# Patient Record
Sex: Female | Born: 1950 | Race: Black or African American | Hispanic: No | Marital: Married | Smoking: Never smoker
Health system: Southern US, Community
[De-identification: ages and names within clinical notes are randomized; demographics above are authoritative.]

## PROBLEM LIST (undated history)

## (undated) DIAGNOSIS — I10 Essential (primary) hypertension: Secondary | ICD-10-CM

## (undated) DIAGNOSIS — E7800 Pure hypercholesterolemia, unspecified: Secondary | ICD-10-CM

## (undated) DIAGNOSIS — E119 Type 2 diabetes mellitus without complications: Secondary | ICD-10-CM

## (undated) HISTORY — PX: BREAST EXCISIONAL BIOPSY: SUR124

## (undated) HISTORY — DX: Pure hypercholesterolemia, unspecified: E78.00

## (undated) HISTORY — DX: Essential (primary) hypertension: I10

---

## 1993-07-23 HISTORY — PX: TOTAL ABDOMINAL HYSTERECTOMY: SHX209

## 1998-07-23 HISTORY — PX: BREAST EXCISIONAL BIOPSY: SUR124

## 2015-05-31 ENCOUNTER — Other Ambulatory Visit: Payer: Self-pay | Admitting: Internal Medicine

## 2015-05-31 ENCOUNTER — Ambulatory Visit
Admission: RE | Admit: 2015-05-31 | Discharge: 2015-05-31 | Disposition: A | Discharge status: Home / Self Care | Payer: BLUE CROSS/BLUE SHIELD | Source: Ambulatory Visit | Attending: Internal Medicine | Admitting: Internal Medicine

## 2015-05-31 DIAGNOSIS — R52 Pain, unspecified: Secondary | ICD-10-CM

## 2016-08-16 ENCOUNTER — Encounter: Payer: BLUE CROSS/BLUE SHIELD | Admitting: Neurology

## 2016-08-23 ENCOUNTER — Encounter (INDEPENDENT_AMBULATORY_CARE_PROVIDER_SITE_OTHER): Payer: Self-pay

## 2016-08-23 ENCOUNTER — Ambulatory Visit (INDEPENDENT_AMBULATORY_CARE_PROVIDER_SITE_OTHER): Payer: PRIVATE HEALTH INSURANCE | Admitting: Diagnostic Neuroimaging

## 2016-08-23 DIAGNOSIS — Z0289 Encounter for other administrative examinations: Secondary | ICD-10-CM

## 2016-08-23 DIAGNOSIS — R200 Anesthesia of skin: Secondary | ICD-10-CM

## 2016-08-23 DIAGNOSIS — R208 Other disturbances of skin sensation: Secondary | ICD-10-CM

## 2016-08-27 NOTE — Procedures (Signed)
[facility]  NCS (NERVE CONDUCTION STUDY) WITH EMG (ELECTROMYOGRAPHY) REPORT   STUDY DATE: 08/23/16 PATIENT NAME: Michele Pope DOB: 06-29-1951 MRN: [id number]  ORDERING CLINICIAN: Enrigue CatenaJ Moore, NP  TECHNOLOGIST: Gearldine ShownLorraine jones  ELECTROMYOGRAPHER: Glenford BayleyVikram R. Taniah Reinecke, MD  CLINICAL INFORMATION: 66 year old female with left foot numbness, now improved. Patient denies back pain. Patient has mild diabetes.  FINDINGS: NERVE CONDUCTION STUDY: Bilateral peroneal and tibial motor responses and F wave latencies are normal.  Bilateral superficial peroneal sensory responses could not be obtained.   NEEDLE ELECTROMYOGRAPHY: Needle examination of left vastus medialis, tibials anterior and gastrocnemius muscles is normal.   IMPRESSION:  Mildly abnormal study demonstrating distal, symmetric axonal sensory neuropathy.      INTERPRETING PHYSICIAN:  Suanne MarkerVIKRAM R. Shareeka Yim, MD Certified in Neurology, Neurophysiology and Neuroimaging  [facility] [address], [address] [place], [place] [address] [phone]      [facility]    Nerve / Sites Rec. Site Peak Lat Ref.  Amp Ref. Segments Distance    ms ms V V  cm  R Superficial peroneal - Ankle     Lat leg Ankle NR ?4.4 NR ?6 Lat leg - Ankle 14  L Superficial peroneal - Ankle     Lat leg Ankle NR ?4.4 NR ?6 Lat leg - Ankle 14         [facility]    Nerve / Sites Muscle Latency Ref. Amplitude Ref. Rel Amp Segments Distance Lat Diff Velocity Ref. Area    ms ms mV mV %  cm ms m/s m/s mVms  L Peroneal - EDB     Ankle EDB 5.8 ?6.5 3.9 ?2.0 100 Ankle - EDB 9    14.4     Fib head EDB 12.9  6.1  155 Fib head - Ankle 37 7.0 53 ?44 23.8     Pop fossa EDB 14.4  5.7  93.7 Pop fossa - Fib head 10 1.5 66 ?44 22.0         Pop fossa - Ankle  8.5     L Tibial - AH     Ankle AH 4.4 ?5.8 10.0 ?4.0 100 Ankle - AH 9    32.6     Pop fossa AH 13.2  7.5  75.4 Pop fossa - Ankle 42 8.8 48 ?41 28.3  R Tibial - AH     Ankle AH 6.0  ?5.8 8.8 ?4.0 100 Ankle - AH 9    19.9     Pop fossa AH 14.3  7.0  78.9 Pop fossa - Ankle 44 8.3 53 ?41 21.6  R Peroneal - EDB     Ankle EDB 3.5 ?6.5 15.5 ?2.0 100 Ankle - EDB 9    51.5     Fib head EDB 10.6  12.7  82 Fib head - Ankle 37 7.1 52 ?44 47.3     Pop fossa EDB 12.6  11.3  88.7 Pop fossa - Fib head 10 1.9 52 ?44 35.8         Pop fossa - Ankle  9.0                F  Wave    Nerve F Lat Ref.   ms ms  L Peroneal - EDB 51.7 ?56.0  L Tibial - AH 26.8 ?56.0  R Tibial - AH 50.1 ?56.0  R Peroneal - EDB 49.0 ?56.0             EMG  EMG Summary Table    Spontaneous MUAP Recruitment  Muscle IA Fib PSW Fasc Other Amp Dur. Poly Pattern  L. Vastus medialis Normal None None None _______ Normal Normal Normal Normal  L. Tibialis anterior Normal None None None _______ Normal Normal Normal Normal  L. Gastrocnemius (Medial head) Normal None None None _______ Normal Normal Normal Normal

## 2017-04-23 ENCOUNTER — Other Ambulatory Visit: Payer: Self-pay | Admitting: Internal Medicine

## 2017-04-23 ENCOUNTER — Other Ambulatory Visit: Payer: Self-pay | Admitting: Nurse Practitioner

## 2017-04-23 DIAGNOSIS — N63 Unspecified lump in unspecified breast: Secondary | ICD-10-CM

## 2017-04-29 ENCOUNTER — Ambulatory Visit
Admission: RE | Admit: 2017-04-29 | Discharge: 2017-04-29 | Disposition: A | Discharge status: Home / Self Care | Payer: PRIVATE HEALTH INSURANCE | Source: Ambulatory Visit | Attending: Internal Medicine | Admitting: Internal Medicine

## 2017-04-29 DIAGNOSIS — N63 Unspecified lump in unspecified breast: Secondary | ICD-10-CM

## 2018-01-07 DIAGNOSIS — Z0000 Encounter for general adult medical examination without abnormal findings: Secondary | ICD-10-CM | POA: Insufficient documentation

## 2018-01-07 LAB — CBC AND DIFFERENTIAL
HEMATOCRIT: 41 (ref 36–46)
HEMOGLOBIN: 14 (ref 12.0–16.0)
PLATELETS: 307 (ref 150–399)
WBC: 6.9

## 2018-01-07 LAB — BASIC METABOLIC PANEL
BUN: 13 (ref 4–21)
Creatinine: 0.9 (ref 0.5–1.1)
GLUCOSE: 94
Potassium: 4.6 (ref 3.4–5.3)
SODIUM: 138 (ref 137–147)

## 2018-01-07 LAB — VITAMIN D 25 HYDROXY (VIT D DEFICIENCY, FRACTURES): Vit D, 25-Hydroxy: 43.4

## 2018-01-07 LAB — HEPATIC FUNCTION PANEL
ALT: 44 — AB (ref 7–35)
AST: 49 — AB (ref 13–35)
Alkaline Phosphatase: 70 (ref 25–125)
Bilirubin, Total: 0.4

## 2018-01-07 LAB — LIPID PANEL
CHOLESTEROL: 147 (ref 0–200)
HDL: 36 (ref 35–70)
LDL Cholesterol: 65
LDL/HDL RATIO: 1.8
TRIGLYCERIDES: 229 — AB (ref 40–160)

## 2018-01-07 LAB — HEMOGLOBIN A1C: HEMOGLOBIN A1C: 6.1

## 2018-01-07 LAB — VITAMIN B12: VITAMIN B 12: 273

## 2018-02-04 ENCOUNTER — Other Ambulatory Visit (HOSPITAL_BASED_OUTPATIENT_CLINIC_OR_DEPARTMENT_OTHER): Payer: Self-pay

## 2018-02-04 DIAGNOSIS — R0683 Snoring: Secondary | ICD-10-CM

## 2018-02-04 DIAGNOSIS — R5383 Other fatigue: Secondary | ICD-10-CM

## 2018-05-10 ENCOUNTER — Encounter: Payer: Self-pay | Admitting: Internal Medicine

## 2018-05-10 DIAGNOSIS — Z0000 Encounter for general adult medical examination without abnormal findings: Secondary | ICD-10-CM

## 2018-05-14 ENCOUNTER — Ambulatory Visit: Payer: Medicare Other | Admitting: Internal Medicine

## 2018-05-14 ENCOUNTER — Encounter: Payer: Self-pay | Admitting: Internal Medicine

## 2018-05-14 ENCOUNTER — Other Ambulatory Visit: Payer: Self-pay

## 2018-05-14 VITALS — BP 126/78 | HR 77 | Temp 97.9°F | Ht 66.5 in | Wt 206.6 lb

## 2018-05-14 DIAGNOSIS — Z23 Encounter for immunization: Secondary | ICD-10-CM | POA: Diagnosis not present

## 2018-05-14 DIAGNOSIS — R748 Abnormal levels of other serum enzymes: Secondary | ICD-10-CM | POA: Diagnosis not present

## 2018-05-14 DIAGNOSIS — E1159 Type 2 diabetes mellitus with other circulatory complications: Secondary | ICD-10-CM | POA: Insufficient documentation

## 2018-05-14 DIAGNOSIS — D519 Vitamin B12 deficiency anemia, unspecified: Secondary | ICD-10-CM | POA: Diagnosis not present

## 2018-05-14 DIAGNOSIS — I10 Essential (primary) hypertension: Secondary | ICD-10-CM

## 2018-05-14 DIAGNOSIS — E1165 Type 2 diabetes mellitus with hyperglycemia: Secondary | ICD-10-CM

## 2018-05-14 DIAGNOSIS — E119 Type 2 diabetes mellitus without complications: Secondary | ICD-10-CM | POA: Insufficient documentation

## 2018-05-14 DIAGNOSIS — E1169 Type 2 diabetes mellitus with other specified complication: Secondary | ICD-10-CM | POA: Insufficient documentation

## 2018-05-14 LAB — CMP14+EGFR
A/G RATIO: 1.5 (ref 1.2–2.2)
ALBUMIN: 4.4 g/dL (ref 3.6–4.8)
ALK PHOS: 71 IU/L (ref 39–117)
ALT: 47 IU/L — ABNORMAL HIGH (ref 0–32)
AST: 54 IU/L — AB (ref 0–40)
BUN / CREAT RATIO: 13 (ref 12–28)
BUN: 12 mg/dL (ref 8–27)
Bilirubin Total: 0.4 mg/dL (ref 0.0–1.2)
CALCIUM: 10.1 mg/dL (ref 8.7–10.3)
CO2: 23 mmol/L (ref 20–29)
CREATININE: 0.92 mg/dL (ref 0.57–1.00)
Chloride: 101 mmol/L (ref 96–106)
GFR calc Af Amer: 76 mL/min/{1.73_m2} (ref >59)
GFR, EST NON AFRICAN AMERICAN: 66 mL/min/{1.73_m2} (ref >59)
Globulin, Total: 3 g/dL (ref 1.5–4.5)
Glucose: 89 mg/dL (ref 65–99)
POTASSIUM: 4.6 mmol/L (ref 3.5–5.2)
SODIUM: 140 mmol/L (ref 134–144)
Total Protein: 7.4 g/dL (ref 6.0–8.5)

## 2018-05-14 LAB — HEMOGLOBIN A1C
ESTIMATED AVERAGE GLUCOSE: 126 mg/dL
Hgb A1c MFr Bld: 6 % — ABNORMAL HIGH (ref 4.8–5.6)

## 2018-05-14 MED ORDER — GLUCOSE BLOOD VI STRP: ORAL_STRIP | 11 refills | Status: DC

## 2018-05-14 MED ORDER — CYANOCOBALAMIN 1000 MCG/ML IJ SOLN: 1000.0000 ug | Freq: Once | INTRAMUSCULAR | Status: DC

## 2018-05-14 MED ORDER — ZOSTER VAC RECOMB ADJUVANTED 50 MCG/0.5ML IM SUSR: 0.5000 mL | Freq: Once | INTRAMUSCULAR | 1 refills | Status: AC

## 2018-05-14 MED ORDER — ONETOUCH ULTRA 2 W/DEVICE KIT: PACK | 1 refills | Status: DC

## 2018-05-14 NOTE — Progress Notes (Signed)
Subjective:     Patient ID: Michele Pope , female    DOB: 08-31-1950 , 67 y.o.   MRN: [id number]   Diabetes  She presents for her follow-up diabetic visit. She has type 2 diabetes mellitus. There are no hypoglycemic associated symptoms. There are no diabetic associated symptoms. There are no hypoglycemic complications. Her breakfast blood glucose is taken between 8-9 am. Her breakfast blood glucose range is generally 110-130 mg/dl.  Hypertension  This is a chronic problem. The problem is unchanged. The problem is controlled. Risk factors for coronary artery disease include diabetes mellitus, dyslipidemia, post-menopausal state and sedentary lifestyle.     History reviewed. No pertinent past medical history.    Current Outpatient Medications:  .  Blood Glucose Monitoring Suppl (ONE TOUCH ULTRA 2) w/Device KIT, Use as directed to check blood sugars 1 time per day dx: e11.22, Disp: 1 each, Rfl: 1 .  diclofenac sodium (VOLTAREN) 1 % GEL, Apply topically 4 (four) times daily., Disp: , Rfl:  .  ezetimibe (ZETIA) 10 MG tablet, Take 10 mg by mouth daily., Disp: , Rfl:  .  glucose blood (ONE TOUCH ULTRA TEST) test strip, Use as instructed to check blood sugars 1 time per day dx: e11.65, Disp: 100 each, Rfl: 11 .  Icosapent Ethyl (VASCEPA PO), Take by mouth., Disp: , Rfl:  .  levocetirizine (XYZAL) 5 MG tablet, Take 5 mg by mouth every evening., Disp: , Rfl:  .  lisinopril (PRINIVIL,ZESTRIL) 10 MG tablet, Take 10 mg by mouth daily., Disp: , Rfl:  .  loratadine (CLARITIN) 10 MG tablet, Take 10 mg by mouth daily., Disp: , Rfl:  .  meloxicam (MOBIC) 15 MG tablet, Take 15 mg by mouth daily., Disp: , Rfl:  .  mometasone (NASONEX) 50 MCG/ACT nasal spray, Place 2 sprays into the nose daily., Disp: , Rfl:  .  omeprazole (PRILOSEC) 40 MG capsule, Take 40 mg by mouth daily., Disp: , Rfl:  .  SitaGLIPtin-MetFORMIN HCl (JANUMET XR) 50-1000 MG TB24, Take 1 tablet by mouth 2 (two) times daily., Disp: , Rfl:  .   Vitamin D, Cholecalciferol, 1000 units CAPS, Take by mouth., Disp: , Rfl:   Current Facility-Administered Medications:  .  cyanocobalamin ((VITAMIN B-12)) injection 1,000 mcg, 1,000 mcg, Intramuscular, Once, Glendale Chard, MD   Allergies  Allergen Reactions  . Pitavastatin      Review of Systems  Constitutional: Negative.   HENT: Negative.   Respiratory: Negative.   Cardiovascular: Negative.   Psychiatric/Behavioral: Negative.      Today's Vitals   05/14/18 0932  BP: 126/78  Pulse: 77  Temp: 97.9 F (36.6 C)  TempSrc: Oral  Weight: 206 lb 9.6 oz (93.7 kg)  Height: 5' 6.5" (1.689 m)  PainSc: 0-No pain   Body mass index is 32.85 kg/m.   Objective:  Physical Exam  Constitutional: She is oriented to person, place, and time. She appears well-developed and well-nourished.  Eyes: EOM are normal.  Neck: Normal range of motion.  Cardiovascular: Normal rate, regular rhythm and normal heart sounds.  Pulmonary/Chest: Effort normal and breath sounds normal.  Neurological: She is alert and oriented to person, place, and time.  Psychiatric: She has a normal mood and affect.  Nursing note and vitals reviewed.       Assessment And Plan:     1. Uncontrolled type 2 diabetes mellitus with hyperglycemia (HCC)  I WILL CHECK LABS AS LISTED BELOW. SHE IS ENCOURAGED TO EXERCISE NO LESS THAN FIVE DAYS WEEKLY FOR  AT LEAST 30 MINUTES.   - CMP14+EGFR - Hemoglobin A1c  2. Essential hypertension, benign  WELL CONTROLLED.  CONTINUE WITH CURRENT MEDS. SHE IS ENCOURAGED TO AVOID ADDING SALT TO HER FOODS.   3. Anemia due to vitamin B12 deficiency, unspecified B12 deficiency type  SHE WAS GIVEN VIT B12 IM X 1. SHE WILL RTO IN 3 MONTHS FOR RE-EVALUATION.   - cyanocobalamin ((VITAMIN B-12)) injection 1,000 mcg  4. Abnormal liver enzymes  I WILL CHECK REPEAT LFTS TODAY. I THINK HER LEVELS ARE DUE TO FATTY LIVER. SHE IS ENCOURAGED TO EXERCISE REGULARLY AND AVOID PROCESSED FOODS.   -  CMP14+EGFR  5. Need for vaccination  - Flu vaccine HIGH DOSE PF (Fluzone High dose)        [name], MD

## 2018-05-14 NOTE — Patient Instructions (Signed)

## 2018-05-18 ENCOUNTER — Encounter: Payer: Self-pay | Admitting: Internal Medicine

## 2018-05-19 NOTE — Progress Notes (Signed)
Here are your lab results:  Your kidney function is normal. Your liver enzymes are slightly elevated. This could be due to fatty liver. It is important that you avoid processed foods, sugary drinks and increase exercise to no less than five days weekly. Your a1c is great at 6.0.    Keep up the great work!   Sincerely,    Xitlalli Newhard N. Allyne Gee, MD

## 2018-05-23 ENCOUNTER — Other Ambulatory Visit: Payer: Self-pay

## 2018-06-21 ENCOUNTER — Emergency Department (HOSPITAL_COMMUNITY): Payer: Medicare Other

## 2018-06-21 ENCOUNTER — Emergency Department (HOSPITAL_COMMUNITY)
Admission: EM | Admit: 2018-06-21 | Discharge: 2018-06-21 | Disposition: A | Discharge status: Home / Self Care | Payer: Medicare Other | Attending: Emergency Medicine | Admitting: Emergency Medicine

## 2018-06-21 ENCOUNTER — Encounter (HOSPITAL_COMMUNITY): Payer: Self-pay

## 2018-06-21 DIAGNOSIS — M542 Cervicalgia: Secondary | ICD-10-CM | POA: Insufficient documentation

## 2018-06-21 DIAGNOSIS — I10 Essential (primary) hypertension: Secondary | ICD-10-CM | POA: Insufficient documentation

## 2018-06-21 DIAGNOSIS — Z79899 Other long term (current) drug therapy: Secondary | ICD-10-CM | POA: Insufficient documentation

## 2018-06-21 DIAGNOSIS — E119 Type 2 diabetes mellitus without complications: Secondary | ICD-10-CM | POA: Diagnosis not present

## 2018-06-21 DIAGNOSIS — M79601 Pain in right arm: Secondary | ICD-10-CM | POA: Diagnosis not present

## 2018-06-21 DIAGNOSIS — W1830XA Fall on same level, unspecified, initial encounter: Secondary | ICD-10-CM | POA: Insufficient documentation

## 2018-06-21 DIAGNOSIS — Y92511 Restaurant or cafe as the place of occurrence of the external cause: Secondary | ICD-10-CM | POA: Insufficient documentation

## 2018-06-21 DIAGNOSIS — Y9389 Activity, other specified: Secondary | ICD-10-CM | POA: Insufficient documentation

## 2018-06-21 DIAGNOSIS — Y999 Unspecified external cause status: Secondary | ICD-10-CM | POA: Insufficient documentation

## 2018-06-21 DIAGNOSIS — W19XXXA Unspecified fall, initial encounter: Secondary | ICD-10-CM

## 2018-06-21 HISTORY — DX: Type 2 diabetes mellitus without complications: E11.9

## 2018-06-21 MED ORDER — HYDROCODONE-ACETAMINOPHEN 5-325 MG PO TABS: 0.5000 | ORAL_TABLET | Freq: Four times a day (QID) | ORAL | 0 refills | Status: DC | PRN

## 2018-06-21 MED ORDER — METHOCARBAMOL 500 MG PO TABS
500.0000 mg | ORAL_TABLET | Freq: Four times a day (QID) | ORAL | 0 refills | Status: DC
Start: 2018-06-21 — End: 2018-09-15

## 2018-06-21 MED ORDER — HYDROCODONE-ACETAMINOPHEN 5-325 MG PO TABS
1.0000 | ORAL_TABLET | Freq: Once | ORAL | Status: AC
  Administered 2018-06-21: 1 via ORAL
  Filled 2018-06-21: qty 1

## 2018-06-21 NOTE — ED Notes (Signed)
Pt returned from xray

## 2018-06-21 NOTE — ED Notes (Signed)
Pt unable to do cspine due to pain

## 2018-06-21 NOTE — Discharge Instructions (Signed)
Please read and follow all provided instructions.  Your diagnoses today include:  1. Right arm pain   2. Neck pain   3. Fall, initial encounter     Tests performed today include:  An x-ray of the affected areas - does NOT show any broken bones  Vital signs. See below for your results today.   Medications prescribed:   Vicodin (hydrocodone/acetaminophen) - narcotic pain medication  DO NOT drive or perform any activities that require you to be awake and alert because this medicine can make you drowsy. BE VERY CAREFUL not to take multiple medicines containing Tylenol (also called acetaminophen). Doing so can lead to an overdose which can damage your liver and cause liver failure and possibly death.   Robaxin (methocarbamol) - muscle relaxer medication  DO NOT drive or perform any activities that require you to be awake and alert because this medicine can make you drowsy.   Take any prescribed medications only as directed.  Home care instructions:   Follow any educational materials contained in this packet  Follow R.I.C.E. Protocol:  R - rest your injury   I  - use ice on injury without applying directly to skin  C - compress injury with bandage or splint  E - elevate the injury as much as possible  Follow-up instructions: Please follow-up with your primary care provider or the provided orthopedic physician (bone specialist) if you continue to have significant pain in 1 week. In this case you may have a more severe injury that requires further care.   Return instructions:   Please return if your fingers are numb or tingling, appear gray or blue, or you have severe pain (also elevate the arm and loosen splint or wrap if you were given one)  Please return to the Emergency Department if you experience worsening symptoms.   Please return if you have any other emergent concerns.  Additional Information:  Your vital signs today were: BP 127/79 (BP Location: Left Arm)     Pulse 86    Temp 97.8 F (36.6 C) (Oral)    Resp 18    SpO2 99%  If your blood pressure (BP) was elevated above 135/85 this visit, please have this repeated by your doctor within one month. --------------

## 2018-06-21 NOTE — ED Notes (Signed)
pts family and the pt upset about the wait for xray

## 2018-06-21 NOTE — ED Provider Notes (Signed)
[facility] EMERGENCY DEPARTMENT Provider Note   CSN: [id number] Arrival date & time: 06/21/18  1920     History   Chief Complaint Chief Complaint  Patient presents with  . Arm Pain  . Fall    HPI Michele Pope is a 67 y.o. female.  Patient presents to the emergency department with acute onset of right upper extremity and neck pain.  Patient sustained a mechanical fall just prior to arrival while she was out at dinner.  She felt hard onto her right shoulder and arm.  Patient complains of pain from the right lateral neck down into her shoulder and down into her forearm.  Pain is worse with movement and palpation.  She denies any numbness or tingling.  She denies hitting her head or losing consciousness.  No treatments prior to arrival.     Past Medical History:  Diagnosis Date  . Diabetes mellitus without complication [facility])     Patient Active Problem List   Diagnosis Date Noted  . Uncontrolled type 2 diabetes mellitus with hyperglycemia ([place]) 05/14/2018  . Essential hypertension, benign 05/14/2018  . Anemia due to vitamin B12 deficiency 05/14/2018  . Abnormal liver enzymes 05/14/2018  . Encounter for general adult medical examination without abnormal findings 01/07/2018    Past Surgical History:  Procedure Laterality Date  . BREAST EXCISIONAL BIOPSY Right      OB History   None      Home Medications    Prior to Admission medications   Medication Sig Start Date End Date Taking? Authorizing Provider  Blood Glucose Monitoring Suppl (ONE TOUCH ULTRA 2) w/Device KIT Use as directed to check blood sugars 1 time per day dx: e11.22 05/14/18   Glendale Chard, MD  diclofenac sodium (VOLTAREN) 1 % GEL Apply topically 4 (four) times daily.    [provider]  ezetimibe (ZETIA) 10 MG tablet Take 10 mg by mouth daily.    [provider]  glucose blood (ONE TOUCH ULTRA TEST) test strip Use as instructed to check blood sugars 1 time per  day dx: e11.65 05/14/18   Glendale Chard, MD  Icosapent Ethyl (VASCEPA PO) Take by mouth.    [provider]  levocetirizine (XYZAL) 5 MG tablet Take 5 mg by mouth every evening.    [provider]  lisinopril (PRINIVIL,ZESTRIL) 10 MG tablet Take 10 mg by mouth daily.    [provider]  loratadine (CLARITIN) 10 MG tablet Take 10 mg by mouth daily.    [provider]  meloxicam (MOBIC) 15 MG tablet Take 15 mg by mouth daily.    [provider]  mometasone (NASONEX) 50 MCG/ACT nasal spray Place 2 sprays into the nose daily.    [provider]  omeprazole (PRILOSEC) 40 MG capsule Take 40 mg by mouth daily.    [provider]  SitaGLIPtin-MetFORMIN HCl (JANUMET XR) 50-1000 MG TB24 Take 1 tablet by mouth 2 (two) times daily.    [provider]  Vitamin D, Cholecalciferol, 1000 units CAPS Take by mouth.    [provider]    Family History Family History  Problem Relation Age of Onset  . Heart attack Mother     Social History Social History   Tobacco Use  . Smoking status: Never Smoker  . Smokeless tobacco: Never Used  Substance Use Topics  . Alcohol use: Never    Frequency: Never  . Drug use: Never     Allergies   Pitavastatin  Review of Systems Review of Systems  Constitutional: Negative for activity change.  Musculoskeletal: Positive for arthralgias, myalgias and neck pain. Negative for back pain and joint swelling.  Skin: Negative for wound.  Neurological: Negative for weakness and numbness.     Physical Exam Updated Vital Signs BP 127/79 (BP Location: Left Arm)   Pulse 86   Temp 97.8 F (36.6 C) (Oral)   Resp 18   SpO2 99%   Physical Exam  Constitutional: She appears well-developed and well-nourished.  HENT:  Head: Normocephalic and atraumatic.  Eyes: Pupils are equal, round, and reactive to light.  Neck: Normal range of motion. Neck supple.  Cardiovascular: Normal pulses. Exam  reveals no decreased pulses.  Pulses:      Radial pulses are 2+ on the right side, and 2+ on the left side.  Musculoskeletal: She exhibits tenderness. She exhibits no edema.       Right shoulder: She exhibits decreased range of motion, tenderness and bony tenderness.       Right elbow: She exhibits normal range of motion and no swelling. Tenderness found.       Right wrist: She exhibits decreased range of motion, tenderness and bony tenderness. She exhibits no swelling and no deformity.       Cervical back: She exhibits tenderness (paraspinous). She exhibits normal range of motion and no bony tenderness.       Right upper arm: She exhibits tenderness. She exhibits no bony tenderness and no swelling.       Right forearm: She exhibits tenderness. She exhibits no bony tenderness and no swelling.       Right hand: Normal.  Neurological: She is alert. No sensory deficit.  Motor, sensation, and vascular distal to the injury is fully intact.   Skin: Skin is warm and dry.  Psychiatric: She has a normal mood and affect.  Nursing note and vitals reviewed.    ED Treatments / Results  Labs (all labs ordered are listed, but only abnormal results are displayed) Labs Reviewed - No data to display  EKG None  Radiology Dg Shoulder Right  Result Date: 06/21/2018 CLINICAL DATA:  Patient stated she fell in the movie theatre. Patient in severe pain and wasn't willing to be put in certain positions in order to obtain a diagnostic image EXAM: RIGHT SHOULDER - 2+ VIEW COMPARISON:  None. FINDINGS: No fracture.  No bone lesion. Glenohumeral joint normally spaced and aligned. 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 Soft tissues are unremarkable. IMPRESSION: No fracture or dislocation. Electronically Signed   By: Lajean Manes M.D.   On: 06/21/2018 20:56   Dg Elbow Complete Right  Result Date: 06/21/2018 CLINICAL DATA:  Patient stated she fell in the movie theatre. Patient in severe  pain and wasn't willing to be put in certain positions in order to obtain a diagnostic image EXAM: RIGHT ELBOW - COMPLETE 3+ VIEW COMPARISON:  None. FINDINGS: No fracture or bone lesion. Elbow joint normally spaced and aligned. Minor spurring from the ulna the ulnar trochlear articulation. No other arthropathic change. No joint effusion. Soft tissues are unremarkable. IMPRESSION: No fracture or dislocation. Electronically Signed   By: Lajean Manes M.D.   On: 06/21/2018 20:57   Dg Wrist Complete Right  Result Date: 06/21/2018 CLINICAL DATA:  Patient stated she fell in the movie theatre. Patient in severe pain and wasn't willing to be put in certain positions in order to obtain a diagnostic image EXAM: RIGHT WRIST - COMPLETE 3+  VIEW COMPARISON:  None. FINDINGS: No fracture.  No bone lesion. Wrist joints are normally spaced and aligned. No significant arthropathic change. Soft tissues are unremarkable. IMPRESSION: Negative. Electronically Signed   By: Lajean Manes M.D.   On: 06/21/2018 20:55    Procedures Procedures (including critical care time)  Medications Ordered in ED Medications  HYDROcodone-acetaminophen (NORCO/VICODIN) 5-325 MG per tablet 1 tablet (1 tablet Oral Given 06/21/18 2056)     Initial Impression / Assessment and Plan / ED Course  I have reviewed the triage vital signs and the nursing notes.  Pertinent labs & imaging results that were available during my care of the patient were reviewed by me and considered in my medical decision making (see chart for details).     Patient seen and examined. Work-up initiated. Medications ordered. X-rays pending.   Vital signs reviewed and are as follows: BP 127/79 (BP Location: Left Arm)   Pulse 86   Temp 97.8 F (36.6 C) (Oral)   Resp 18   SpO2 99%   10:28 PM x-rays of the upper extremities are negative.  Patient informed.  She will be provided with a sling.  She was unable to tolerate spine imaging.  Patient received a dose of  Vicodin.    Awaiting cervical spine films.  If these are negative, patient can be discharged to home.  Encouraged PCP or orthopedic follow-up if not improved in 1 week.  Handoff to [facility] at shift change.   Patient counseled on use of narcotic pain and ,uslce relaxer medications. Counseled not to combine these medications with others containing tylenol. Urged not to drink alcohol, drive, or perform any other activities that requires focus while taking these medications. The patient verbalizes understanding and agrees with the plan.    Final Clinical Impressions(s) / ED Diagnoses   Final diagnoses:  Right arm pain  Neck pain  Fall, initial encounter   Patient with right arm pain and neck pain after fall.  Imaging negative to this point.  Cervical spine films are pending.  No signs of neurovascular compromise in the right upper extremity.  If negative, pain control, rice protocol, follow-up as needed.    ED Discharge Orders         Ordered    HYDROcodone-acetaminophen (NORCO/VICODIN) 5-325 MG tablet  Every 6 hours PRN     06/21/18 2226    methocarbamol (ROBAXIN) 500 MG tablet  4 times daily     06/21/18 2226           Carlisle Cater, PA-C 06/21/18 2242    Quintella Reichert, MD 06/22/18 [phone]

## 2018-06-21 NOTE — ED Notes (Signed)
Pt in xray

## 2018-06-21 NOTE — ED Provider Notes (Signed)
Plain films of cervical spine are negative.  Treatment per plan established by [place].   Roxy HorsemanBrowning, Caymen Dubray, PA-C 06/21/18 [id number]    Tilden Fossaees, Elizabeth, MD 06/22/18 [phone]

## 2018-06-21 NOTE — ED Triage Notes (Signed)
Pt states that at dinner tonight she had a mechanical fall and fell onto her right arm, pain from R shoulder to R wrist. Did not hit head, no LOC.

## 2018-06-27 ENCOUNTER — Encounter: Payer: Self-pay | Admitting: Internal Medicine

## 2018-06-27 ENCOUNTER — Ambulatory Visit (INDEPENDENT_AMBULATORY_CARE_PROVIDER_SITE_OTHER): Payer: Medicare Other | Admitting: Internal Medicine

## 2018-06-27 VITALS — BP 116/78 | HR 85 | Temp 97.9°F | Ht 66.5 in | Wt 202.0 lb

## 2018-06-27 DIAGNOSIS — Z09 Encounter for follow-up examination after completed treatment for conditions other than malignant neoplasm: Secondary | ICD-10-CM | POA: Diagnosis not present

## 2018-06-27 DIAGNOSIS — M25511 Pain in right shoulder: Secondary | ICD-10-CM

## 2018-06-27 DIAGNOSIS — W19XXXD Unspecified fall, subsequent encounter: Secondary | ICD-10-CM

## 2018-06-27 DIAGNOSIS — Z712 Person consulting for explanation of examination or test findings: Secondary | ICD-10-CM

## 2018-06-27 MED ORDER — TRAMADOL HCL 50 MG PO TABS: 50.0000 mg | ORAL_TABLET | Freq: Four times a day (QID) | ORAL | 0 refills | Status: DC | PRN

## 2018-06-30 ENCOUNTER — Telehealth: Payer: Self-pay

## 2018-06-30 NOTE — Telephone Encounter (Signed)
Left the pt a message that Dr, Allyne GeeSanders wanted to know if the pt sent to the Orthopedic on Saturday and to ask what the plan was is.

## 2018-07-03 ENCOUNTER — Other Ambulatory Visit: Payer: Self-pay | Admitting: Internal Medicine

## 2018-07-27 ENCOUNTER — Encounter: Payer: Self-pay | Admitting: Internal Medicine

## 2018-07-27 NOTE — Progress Notes (Signed)
Subjective:     Patient ID: Michele Pope , female    DOB: June 30, 1951 , 68 y.o.   MRN: [id number]   Chief Complaint  Patient presents with  . right arm pain    HPI  She is here today for ER f/u.  She was seen at [facility] ER on 11/30 after sustaining a fall.  she reports she fell just prior to arrival while she was out at dinner.  She felt hard onto her right shoulder and arm.  Patient complains of pain from the right lateral neck down into her shoulder and down into her forearm.  Pain is worse with movement and palpation.  She denies any numbness or tingling.  She denies hitting her head or losing consciousness.  No treatments prior to arrival. ER w/u was negative for acute fracture. She is still having severe pain.      Past Medical History:  Diagnosis Date  . Diabetes mellitus without complication ([place])      Family History  Problem Relation Age of Onset  . Heart attack Mother   . Blindness Father      Current Outpatient Medications:  .  Blood Glucose Monitoring Suppl (ONE TOUCH ULTRA 2) w/Device KIT, Use as directed to check blood sugars 1 time per day dx: e11.22, Disp: 1 each, Rfl: 1 .  diclofenac sodium (VOLTAREN) 1 % GEL, Apply topically 4 (four) times daily., Disp: , Rfl:  .  ezetimibe (ZETIA) 10 MG tablet, Take 10 mg by mouth daily., Disp: , Rfl:  .  glucose blood (ONE TOUCH ULTRA TEST) test strip, Use as instructed to check blood sugars 1 time per day dx: e11.65, Disp: 100 each, Rfl: 11 .  Icosapent Ethyl (VASCEPA PO), Take by mouth., Disp: , Rfl:  .  levocetirizine (XYZAL) 5 MG tablet, Take 5 mg by mouth every evening., Disp: , Rfl:  .  lisinopril (PRINIVIL,ZESTRIL) 10 MG tablet, Take 10 mg by mouth daily., Disp: , Rfl:  .  loratadine (CLARITIN) 10 MG tablet, Take 10 mg by mouth daily., Disp: , Rfl:  .  mometasone (NASONEX) 50 MCG/ACT nasal spray, Place 2 sprays into the nose daily., Disp: , Rfl:  .  omeprazole (PRILOSEC) 40 MG capsule, Take 40 mg by mouth daily., Disp:  , Rfl:  .  Vitamin D, Cholecalciferol, 1000 units CAPS, Take by mouth., Disp: , Rfl:  .  HYDROcodone-acetaminophen (NORCO/VICODIN) 5-325 MG tablet, Take 0.5-1 tablets by mouth every 6 (six) hours as needed. (Patient not taking: Reported on 06/27/2018), Disp: 6 tablet, Rfl: 0 .  JANUMET XR 50-1000 MG TB24, TAKE 1 TABLET BY MOUTH TWICE A DAY, Disp: 180 tablet, Rfl: 1 .  meloxicam (MOBIC) 15 MG tablet, Take 15 mg by mouth daily., Disp: , Rfl:  .  methocarbamol (ROBAXIN) 500 MG tablet, Take 1 tablet (500 mg total) by mouth 4 (four) times daily. (Patient not taking: Reported on 06/27/2018), Disp: 10 tablet, Rfl: 0 .  traMADol (ULTRAM) 50 MG tablet, Take 1 tablet (50 mg total) by mouth every 6 (six) hours as needed., Disp: 20 tablet, Rfl: 0  Current Facility-Administered Medications:  .  cyanocobalamin ((VITAMIN B-12)) injection 1,000 mcg, 1,000 mcg, Intramuscular, Once, Michele Chard, MD   Allergies  Allergen Reactions  . Pitavastatin      Review of Systems  Constitutional: Negative.   Respiratory: Negative.   Cardiovascular: Negative.   Gastrointestinal: Negative.   Musculoskeletal: Positive for arthralgias and neck pain.  Neurological: Negative.   Psychiatric/Behavioral: Negative.  Today's Vitals   06/27/18 1508  BP: 116/78  Pulse: 85  Temp: 97.9 F (36.6 C)  TempSrc: Oral  Weight: 202 lb (91.6 kg)  Height: 5' 6.5" (1.689 m)  PainSc: 7   PainLoc: Shoulder   Body mass index is 32.12 kg/m.   Objective:  Physical Exam Vitals signs and nursing note reviewed.  Constitutional:      Appearance: Normal appearance. She is obese.  HENT:     Head: Normocephalic and atraumatic.  Cardiovascular:     Rate and Rhythm: Normal rate and regular rhythm.     Heart sounds: Normal heart sounds.  Pulmonary:     Effort: Pulmonary effort is normal.     Breath sounds: Normal breath sounds.  Musculoskeletal:        General: Tenderness present.     Comments: There is limited ROM RUE. No  overlying erythema. Tenderness to palpation of r shoulder, neck  Skin:    General: Skin is warm.  Neurological:     Mental Status: She is alert.         Assessment And Plan:     1. Acute pain of right shoulder  She was given rx tramadol to use prn. An appt was made for her to see Ortho tomorrow at [facility]. Er records were sent over for their review. She is in agreement with her treatment plan.   2. Fall, subsequent encounter  ER records reviewed in full detail.   Michele Greenland, MD

## 2018-08-22 HISTORY — PX: SHOULDER ARTHROSCOPY W/ ROTATOR CUFF REPAIR: SHX2400

## 2018-09-15 ENCOUNTER — Encounter: Payer: Self-pay | Admitting: Internal Medicine

## 2018-09-15 ENCOUNTER — Ambulatory Visit (INDEPENDENT_AMBULATORY_CARE_PROVIDER_SITE_OTHER): Payer: Medicare Other | Admitting: Internal Medicine

## 2018-09-15 VITALS — BP 124/78 | HR 95 | Temp 97.7°F | Ht 66.5 in | Wt 200.2 lb

## 2018-09-15 DIAGNOSIS — D519 Vitamin B12 deficiency anemia, unspecified: Secondary | ICD-10-CM | POA: Diagnosis not present

## 2018-09-15 DIAGNOSIS — Z6831 Body mass index (BMI) 31.0-31.9, adult: Secondary | ICD-10-CM

## 2018-09-15 DIAGNOSIS — Z23 Encounter for immunization: Secondary | ICD-10-CM | POA: Diagnosis not present

## 2018-09-15 DIAGNOSIS — E7800 Pure hypercholesterolemia, unspecified: Secondary | ICD-10-CM

## 2018-09-15 DIAGNOSIS — E785 Hyperlipidemia, unspecified: Secondary | ICD-10-CM | POA: Insufficient documentation

## 2018-09-15 DIAGNOSIS — E1165 Type 2 diabetes mellitus with hyperglycemia: Secondary | ICD-10-CM

## 2018-09-15 DIAGNOSIS — E1169 Type 2 diabetes mellitus with other specified complication: Secondary | ICD-10-CM | POA: Insufficient documentation

## 2018-09-15 DIAGNOSIS — I10 Essential (primary) hypertension: Secondary | ICD-10-CM | POA: Diagnosis not present

## 2018-09-15 DIAGNOSIS — E6609 Other obesity due to excess calories: Secondary | ICD-10-CM

## 2018-09-15 MED ORDER — TETANUS-DIPHTH-ACELL PERTUSSIS 5-2.5-18.5 LF-MCG/0.5 IM SUSP
0.5000 mL | Freq: Once | INTRAMUSCULAR | Status: AC
  Administered 2018-09-15: 0.5 mL via INTRAMUSCULAR

## 2018-09-15 MED ORDER — CYANOCOBALAMIN 1000 MCG/ML IJ SOLN
1000.0000 ug | Freq: Once | INTRAMUSCULAR | Status: AC
  Administered 2018-09-15: 1000 ug via INTRAMUSCULAR

## 2018-09-15 NOTE — Progress Notes (Signed)
Subjective:     Patient ID: Michele Pope , female    DOB: Sep 01, 1950 , 68 y.o.   MRN: [id number]   Chief Complaint  Patient presents with  . Diabetes  . Hypertension    HPI  Diabetes  She presents for her follow-up diabetic visit. She has type 2 diabetes mellitus. There are no hypoglycemic associated symptoms. Pertinent negatives for diabetes include no blurred vision and no chest pain. There are no hypoglycemic complications. Risk factors for coronary artery disease include diabetes mellitus, dyslipidemia, hypertension and post-menopausal.  Hypertension  This is a chronic problem. The current episode started more than 1 year ago. The problem has been gradually improving since onset. The problem is controlled. Pertinent negatives include no blurred vision, chest pain, palpitations or shortness of breath.   She reports compliance with meds.   Past Medical History:  Diagnosis Date  . Diabetes mellitus without complication ([place])   . High cholesterol   . HTN (hypertension)      Family History  Problem Relation Age of Onset  . Heart attack Mother   . Blindness Father      Current Outpatient Medications:  .  Blood Glucose Monitoring Suppl (ONE TOUCH ULTRA 2) w/Device KIT, Use as directed to check blood sugars 1 time per day dx: e11.22, Disp: 1 each, Rfl: 1 .  diclofenac sodium (VOLTAREN) 1 % GEL, Apply topically 4 (four) times daily., Disp: , Rfl:  .  ezetimibe (ZETIA) 10 MG tablet, Take 10 mg by mouth daily., Disp: , Rfl:  .  glucose blood (ONE TOUCH ULTRA TEST) test strip, Use as instructed to check blood sugars 1 time per day dx: e11.65, Disp: 100 each, Rfl: 11 .  Icosapent Ethyl (VASCEPA PO), Take by mouth., Disp: , Rfl:  .  JANUMET XR 50-1000 MG TB24, TAKE 1 TABLET BY MOUTH TWICE A DAY, Disp: 180 tablet, Rfl: 1 .  levocetirizine (XYZAL) 5 MG tablet, Take 5 mg by mouth every evening., Disp: , Rfl:  .  lisinopril (PRINIVIL,ZESTRIL) 10 MG tablet, Take 10 mg by mouth daily.,  Disp: , Rfl:  .  loratadine (CLARITIN) 10 MG tablet, Take 10 mg by mouth daily., Disp: , Rfl:  .  meloxicam (MOBIC) 15 MG tablet, Take 15 mg by mouth daily., Disp: , Rfl:  .  mometasone (NASONEX) 50 MCG/ACT nasal spray, Place 2 sprays into the nose daily., Disp: , Rfl:  .  naproxen (NAPROSYN) 500 MG tablet, Take 500 mg by mouth 2 (two) times daily., Disp: , Rfl:  .  omeprazole (PRILOSEC) 40 MG capsule, Take 40 mg by mouth daily., Disp: , Rfl:  .  Vitamin D, Cholecalciferol, 1000 units CAPS, Take by mouth., Disp: , Rfl:    Allergies  Allergen Reactions  . Pitavastatin      Review of Systems  Constitutional: Negative.   Eyes: Negative for blurred vision.  Respiratory: Negative.  Negative for shortness of breath.   Cardiovascular: Negative.  Negative for chest pain and palpitations.  Gastrointestinal: Negative.   Musculoskeletal: Positive for arthralgias (she has had R shoulder surgery since her last visit. she had a complete rotator cuff tear s/p fall ).  Neurological: Negative.   Psychiatric/Behavioral: Negative.      Today's Vitals   09/15/18 0907  BP: 124/78  Pulse: 95  Temp: 97.7 F (36.5 C)  TempSrc: Oral  Weight: 200 lb 3.2 oz (90.8 kg)  Height: 5' 6.5" (1.689 m)   Body mass index is 31.83 kg/m.   Objective:  Physical Exam Vitals signs and nursing note reviewed.  Constitutional:      Appearance: Normal appearance.  HENT:     Head: Normocephalic and atraumatic.  Cardiovascular:     Rate and Rhythm: Normal rate and regular rhythm.     Heart sounds: Normal heart sounds.  Pulmonary:     Effort: Pulmonary effort is normal.     Breath sounds: Normal breath sounds.  Skin:    General: Skin is warm.  Neurological:     General: No focal deficit present.     Mental Status: She is alert.  Psychiatric:        Mood and Affect: Mood normal.        Behavior: Behavior normal.         Assessment And Plan:     1. Uncontrolled type 2 diabetes mellitus with  hyperglycemia ([place])  She will continue with current meds for now. She is encouraged to resume exercise as tolerated.   - CMP14+EGFR - Hemoglobin A1c  2. Essential hypertension, benign  Well controlled. She will continue with current meds. She is encouraged to avoid adding salt to her foods.   3. Pure hypercholesterolemia  I will check a fasting lipid panel and LFTs. She is encouraged to resume regular exercise as tolerated.   - Lipid panel  4. Anemia due to vitamin B12 deficiency, unspecified B12 deficiency type  I will check vitamin b12 level today. She was also given vitamin B12 injection after her bloodwork was drawn.   - Vitamin B12  5. Class 1 obesity due to excess calories with serious comorbidity and body mass index (BMI) of 31.0 to 31.9 in adult  It is important to achieve optimal weight to decrease risk of cardiovascular disease and cancers. She is encouraged to start slowly - start with 10 minutes twice daily at least three to four days per week and to gradually build to 30 minutes five days weekly. She was given tips to incorporate more activity into her daily routine - take stairs when possible, park farther away from her job, grocery stores, etc.   6. Need for vaccination  - Tdap (BOOSTRIX) injection 0.5 mL        [name], MD

## 2018-09-15 NOTE — Patient Instructions (Signed)
Vitamin B12 Deficiency Vitamin B12 deficiency means that your body is not getting enough vitamin B12. Your body needs vitamin B12 for important bodily functions. If you do not have enough vitamin B12 in your body, you can have health problems. Follow these instructions at home:  Take supplements only as told by your doctor. Follow the directions carefully.  Get any shots (injections) as told by your doctor. Do not miss your visits to the doctor.  Eat lots of healthy foods that contain vitamin B12. Ask your doctor if you should work with someone who is trained in how food affects health (dietitian). Foods that contain vitamin B12 include: ? Meat. ? Meat from birds (poultry). ? Fish. ? Eggs. ? Cereal and dairy products that are fortified. This means that vitamin B12 has been added to the food. Check the label on the package to see if the food is fortified.  Do not drink too much (do not abuse) alcohol.  Keep all follow-up visits as told by your doctor. This is important. Contact a doctor if:  Your symptoms come back. Get help right away if:  You have trouble breathing.  You have chest pain.  You get dizzy.  You pass out (lose consciousness). This information is not intended to replace advice given to you by your health care provider. Make sure you discuss any questions you have with your health care provider. Document Released: 06/28/2011 Document Revised: 12/15/2015 Document Reviewed: 11/24/2014 Elsevier Interactive Patient Education  2019 [employer]  

## 2018-09-16 LAB — CMP14+EGFR
A/G RATIO: 1.4 (ref 1.2–2.2)
ALK PHOS: 71 IU/L (ref 39–117)
ALT: 24 IU/L (ref 0–32)
AST: 31 IU/L (ref 0–40)
Albumin: 4.3 g/dL (ref 3.8–4.8)
BUN/Creatinine Ratio: 13 (ref 12–28)
BUN: 11 mg/dL (ref 8–27)
Bilirubin Total: 0.5 mg/dL (ref 0.0–1.2)
CO2: 24 mmol/L (ref 20–29)
Calcium: 10.1 mg/dL (ref 8.7–10.3)
Chloride: 102 mmol/L (ref 96–106)
Creatinine, Ser: 0.87 mg/dL (ref 0.57–1.00)
GFR calc Af Amer: 81 mL/min/{1.73_m2} (ref >59)
GFR calc non Af Amer: 70 mL/min/{1.73_m2} (ref >59)
GLOBULIN, TOTAL: 3.1 g/dL (ref 1.5–4.5)
Glucose: 87 mg/dL (ref 65–99)
Potassium: 4.6 mmol/L (ref 3.5–5.2)
SODIUM: 142 mmol/L (ref 134–144)
Total Protein: 7.4 g/dL (ref 6.0–8.5)

## 2018-09-16 LAB — LIPID PANEL
Chol/HDL Ratio: 4.1 ratio (ref 0.0–4.4)
Cholesterol, Total: 142 mg/dL (ref 100–199)
HDL: 35 mg/dL — ABNORMAL LOW (ref >39)
LDL Calculated: 62 mg/dL (ref 0–99)
Triglycerides: 227 mg/dL — ABNORMAL HIGH (ref 0–149)
VLDL Cholesterol Cal: 45 mg/dL — ABNORMAL HIGH (ref 5–40)

## 2018-09-16 LAB — VITAMIN B12: VITAMIN B 12: 251 pg/mL (ref 232–1245)

## 2018-09-16 LAB — HEMOGLOBIN A1C
ESTIMATED AVERAGE GLUCOSE: 123 mg/dL
HEMOGLOBIN A1C: 5.9 % — AB (ref 4.8–5.6)

## 2018-09-17 ENCOUNTER — Other Ambulatory Visit: Payer: Self-pay | Admitting: Internal Medicine

## 2018-09-18 ENCOUNTER — Other Ambulatory Visit: Payer: Self-pay | Admitting: Internal Medicine

## 2018-09-25 ENCOUNTER — Encounter: Payer: Self-pay | Admitting: Internal Medicine

## 2018-09-28 ENCOUNTER — Other Ambulatory Visit: Payer: Self-pay | Admitting: Internal Medicine

## 2018-10-01 ENCOUNTER — Encounter: Payer: Self-pay | Admitting: Internal Medicine

## 2018-10-08 ENCOUNTER — Other Ambulatory Visit: Payer: Self-pay

## 2018-10-08 ENCOUNTER — Ambulatory Visit (INDEPENDENT_AMBULATORY_CARE_PROVIDER_SITE_OTHER): Payer: Medicare Other

## 2018-10-08 VITALS — BP 116/82 | HR 82 | Temp 97.9°F | Ht 66.5 in

## 2018-10-08 DIAGNOSIS — D519 Vitamin B12 deficiency anemia, unspecified: Secondary | ICD-10-CM

## 2018-10-08 MED ORDER — CYANOCOBALAMIN 1000 MCG/ML IJ SOLN
1000.0000 ug | Freq: Once | INTRAMUSCULAR | Status: AC
  Administered 2018-10-08: 1000 ug via INTRAMUSCULAR

## 2018-10-10 ENCOUNTER — Other Ambulatory Visit: Payer: Self-pay | Admitting: Internal Medicine

## 2018-10-24 ENCOUNTER — Encounter: Payer: Self-pay | Admitting: Internal Medicine

## 2018-11-02 ENCOUNTER — Other Ambulatory Visit: Payer: Self-pay | Admitting: Internal Medicine

## 2018-11-11 ENCOUNTER — Other Ambulatory Visit: Payer: Self-pay

## 2018-11-11 ENCOUNTER — Encounter: Payer: Self-pay | Admitting: Internal Medicine

## 2018-11-11 ENCOUNTER — Ambulatory Visit: Payer: Medicare Other | Admitting: Internal Medicine

## 2018-11-11 VITALS — BP 132/86 | HR 67 | Temp 97.8°F | Ht 66.6 in | Wt 205.8 lb

## 2018-11-11 DIAGNOSIS — I10 Essential (primary) hypertension: Secondary | ICD-10-CM

## 2018-11-11 DIAGNOSIS — D519 Vitamin B12 deficiency anemia, unspecified: Secondary | ICD-10-CM | POA: Diagnosis not present

## 2018-11-11 MED ORDER — CYANOCOBALAMIN 1000 MCG/ML IJ SOLN
1000.0000 ug | Freq: Once | INTRAMUSCULAR | Status: AC
  Administered 2018-11-11: 1000 ug via INTRAMUSCULAR

## 2018-11-11 NOTE — Patient Instructions (Signed)
Vitamin B12 Deficiency Vitamin B12 deficiency means that your body is not getting enough vitamin B12. Your body needs vitamin B12 for important bodily functions. If you do not have enough vitamin B12 in your body, you can have health problems. Follow these instructions at home:  Take supplements only as told by your doctor. Follow the directions carefully.  Get any shots (injections) as told by your doctor. Do not miss your visits to the doctor.  Eat lots of healthy foods that contain vitamin B12. Ask your doctor if you should work with someone who is trained in how food affects health (dietitian). Foods that contain vitamin B12 include: ? Meat. ? Meat from birds (poultry). ? Fish. ? Eggs. ? Cereal and dairy products that are fortified. This means that vitamin B12 has been added to the food. Check the label on the package to see if the food is fortified.  Do not drink too much (do not abuse) alcohol.  Keep all follow-up visits as told by your doctor. This is important. Contact a doctor if:  Your symptoms come back. Get help right away if:  You have trouble breathing.  You have chest pain.  You get dizzy.  You pass out (lose consciousness). This information is not intended to replace advice given to you by your health care provider. Make sure you discuss any questions you have with your health care provider. Document Released: 06/28/2011 Document Revised: 12/15/2015 Document Reviewed: 11/24/2014 Elsevier Interactive Patient Education  2019 [employer]  

## 2018-11-11 NOTE — Progress Notes (Signed)
Subjective:     Patient ID: Michele Pope , female    DOB: 08/15/1950 , 67 y.o.   MRN: [id number]   Chief Complaint  Patient presents with  . B12 Injection    HPI  She is here today for her monthly vit b12 injection. She reports she is still working despite the COVID-19 pandemic. 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 However, there are a total of 3 working now.     Past Medical History:  Diagnosis Date  . Diabetes mellitus without complication ([facility])   . High cholesterol   . HTN (hypertension)      Family History  Problem Relation Age of Onset  . Heart attack Mother   . Blindness Father      Current Outpatient Medications:  .  Blood Glucose Monitoring Suppl (ONE TOUCH ULTRA 2) w/Device KIT, Use as directed to check blood sugars 1 time per day dx: e11.22, Disp: 1 each, Rfl: 1 .  diclofenac sodium (VOLTAREN) 1 % GEL, Apply topically 4 (four) times daily., Disp: , Rfl:  .  ezetimibe (ZETIA) 10 MG tablet, TAKE 1 TABLET BY MOUTH EVERY DAY, Disp: 90 tablet, Rfl: 1 .  glucose blood (ONE TOUCH ULTRA TEST) test strip, Use as instructed to check blood sugars 1 time per day dx: e11.65, Disp: 100 each, Rfl: 11 .  Icosapent Ethyl (VASCEPA PO), Take by mouth., Disp: , Rfl:  .  JANUMET XR 50-1000 MG TB24, TAKE 1 TABLET BY MOUTH TWICE A DAY, Disp: 180 tablet, Rfl: 1 .  levocetirizine (XYZAL) 5 MG tablet, Take 5 mg by mouth every evening., Disp: , Rfl:  .  lisinopril (PRINIVIL,ZESTRIL) 10 MG tablet, TAKE 1 TABLET BY MOUTH EVERY DAY, Disp: 90 tablet, Rfl: 2 .  loratadine (CLARITIN) 10 MG tablet, Take 10 mg by mouth daily as needed. , Disp: , Rfl:  .  meloxicam (MOBIC) 15 MG tablet, Take 15 mg by mouth daily., Disp: , Rfl:  .  mometasone (NASONEX) 50 MCG/ACT nasal spray, USE 2 SPRAYS IN EACH NOSTRIL EVERY DAY, Disp: 17 g, Rfl: 1 .  naproxen (NAPROSYN) 500 MG tablet, Take 500 mg by mouth as needed. , Disp: , Rfl:  .  omeprazole  (PRILOSEC) 40 MG capsule, Take 40 mg by mouth daily., Disp: , Rfl:  .  Vitamin D, Cholecalciferol, 1000 units CAPS, Take by mouth., Disp: , Rfl:   Current Facility-Administered Medications:  .  cyanocobalamin ((VITAMIN B-12)) injection 1,000 mcg, 1,000 mcg, Intramuscular, Once, Sanders, Robyn, MD   Allergies  Allergen Reactions  . Meloxicam Diarrhea  . Pitavastatin      Review of Systems  Constitutional: Negative.   Respiratory: Negative.   Cardiovascular: Negative.   Gastrointestinal: Negative.   Neurological: Negative.   Psychiatric/Behavioral: Negative.      Today's Vitals   11/11/18 0922  BP: 132/86  Pulse: 67  Temp: 97.8 F (36.6 C)  TempSrc: Oral  SpO2: 97%  Weight: 205 lb 12.8 oz (93.4 kg)  Height: 5' 6.6" (1.692 m)  PainSc: 0-No pain   Body mass index is 32.62 kg/m.   Objective:  Physical Exam Vitals signs and nursing note reviewed.  Constitutional:      Appearance: Normal appearance.  HENT:     Head: Normocephalic and atraumatic.  Cardiovascular:     Rate and Rhythm: Normal rate and regular rhythm.     Heart sounds: Normal heart sounds.  Pulmonary:       Effort: Pulmonary effort is normal.     Breath sounds: Normal breath sounds.  Skin:    General: Skin is warm.  Neurological:     General: No focal deficit present.     Mental Status: She is alert.  Psychiatric:        Mood and Affect: Mood normal.        Behavior: Behavior normal.         Assessment And Plan:     1. Anemia due to vitamin B12 deficiency, unspecified B12 deficiency type  She was given vit b12 IM x 1. She will rto in four weeks for re-evaluation.   - cyanocobalamin ((VITAMIN B-12)) injection 1,000 mcg  2. Essential hypertension, benign  Fair control. Optimal bp is less than 130/80. She is encouraged to incorporate more exercise into her daily routine.   Robyn N Sanders, MD    THE PATIENT IS ENCOURAGED TO PRACTICE SOCIAL DISTANCING DUE TO THE COVID-19 PANDEMIC.   

## 2018-11-12 ENCOUNTER — Ambulatory Visit: Payer: Medicare Other | Admitting: Internal Medicine

## 2018-11-12 ENCOUNTER — Encounter: Payer: Self-pay | Admitting: Internal Medicine

## 2018-11-18 ENCOUNTER — Encounter: Payer: Self-pay | Admitting: Internal Medicine

## 2018-11-26 ENCOUNTER — Other Ambulatory Visit: Payer: Self-pay | Admitting: Nurse Practitioner

## 2018-12-10 ENCOUNTER — Encounter: Payer: Self-pay | Admitting: Internal Medicine

## 2018-12-10 ENCOUNTER — Ambulatory Visit: Payer: Medicare Other | Admitting: Internal Medicine

## 2018-12-10 ENCOUNTER — Other Ambulatory Visit: Payer: Self-pay

## 2018-12-10 VITALS — BP 116/82 | HR 87 | Temp 98.2°F | Ht 66.6 in | Wt 206.6 lb

## 2018-12-10 DIAGNOSIS — E1165 Type 2 diabetes mellitus with hyperglycemia: Secondary | ICD-10-CM

## 2018-12-10 DIAGNOSIS — I10 Essential (primary) hypertension: Secondary | ICD-10-CM | POA: Diagnosis not present

## 2018-12-10 DIAGNOSIS — E6609 Other obesity due to excess calories: Secondary | ICD-10-CM

## 2018-12-10 DIAGNOSIS — Z6832 Body mass index (BMI) 32.0-32.9, adult: Secondary | ICD-10-CM

## 2018-12-10 DIAGNOSIS — D519 Vitamin B12 deficiency anemia, unspecified: Secondary | ICD-10-CM

## 2018-12-11 LAB — BMP8+EGFR
BUN/Creatinine Ratio: 16 (ref 12–28)
BUN: 13 mg/dL (ref 8–27)
CO2: 24 mmol/L (ref 20–29)
Calcium: 10.1 mg/dL (ref 8.7–10.3)
Chloride: 102 mmol/L (ref 96–106)
Creatinine, Ser: 0.81 mg/dL (ref 0.57–1.00)
GFR calc Af Amer: 88 mL/min/{1.73_m2} (ref >59)
GFR calc non Af Amer: 76 mL/min/{1.73_m2} (ref >59)
Glucose: 87 mg/dL (ref 65–99)
Potassium: 4.3 mmol/L (ref 3.5–5.2)
Sodium: 141 mmol/L (ref 134–144)

## 2018-12-11 LAB — HEMOGLOBIN A1C
Est. average glucose Bld gHb Est-mCnc: 123 mg/dL
Hgb A1c MFr Bld: 5.9 % — ABNORMAL HIGH (ref 4.8–5.6)

## 2018-12-11 LAB — VITAMIN B12: Vitamin B-12: 273 pg/mL (ref 232–1245)

## 2018-12-13 MED ORDER — CYANOCOBALAMIN 1000 MCG/ML IJ SOLN: 1000.0000 ug | Freq: Once | INTRAMUSCULAR | Status: DC

## 2018-12-13 NOTE — Progress Notes (Signed)
Subjective:     Patient ID: Michele Pope , female    DOB: 22-Feb-1951 , 68 y.o.   MRN: [id number]   Chief Complaint  Patient presents with  . B12 Injection  . Diabetes  . Hypertension    HPI  She presents today for her monthly vitamin B12 injection. She has not had any issues since her last injection.   Diabetes  She presents for her follow-up diabetic visit. She has type 2 diabetes mellitus. Her disease course has been stable. There are no hypoglycemic associated symptoms. Pertinent negatives for diabetes include no blurred vision and no chest pain. There are no hypoglycemic complications. Symptoms are stable. Risk factors for coronary artery disease include diabetes mellitus, dyslipidemia, hypertension, obesity, post-menopausal and sedentary lifestyle. She is following a diabetic diet. Her breakfast blood glucose is taken between 8-9 am. Her breakfast blood glucose range is generally 90-110 mg/dl. An ACE inhibitor/angiotensin II receptor blocker is being taken.  Hypertension  This is a chronic problem. The current episode started more than 1 year ago. The problem has been gradually improving since onset. The problem is controlled. Pertinent negatives include no blurred vision, chest pain, palpitations or shortness of breath. The current treatment provides moderate improvement.     Past Medical History:  Diagnosis Date  . Diabetes mellitus without complication ([place])   . High cholesterol   . HTN (hypertension)      Family History  Problem Relation Age of Onset  . Heart attack Mother   . Blindness Father      Current Outpatient Medications:  .  Blood Glucose Monitoring Suppl (ONE TOUCH ULTRA 2) w/Device KIT, Use as directed to check blood sugars 1 time per day dx: e11.22, Disp: 1 each, Rfl: 1 .  diclofenac sodium (VOLTAREN) 1 % GEL, Apply topically 4 (four) times daily., Disp: , Rfl:  .  ezetimibe (ZETIA) 10 MG tablet, TAKE 1 TABLET BY MOUTH EVERY DAY, Disp: 90 tablet, Rfl:  1 .  glucose blood (ONE TOUCH ULTRA TEST) test strip, Use as instructed to check blood sugars 1 time per day dx: e11.65, Disp: 100 each, Rfl: 11 .  Icosapent Ethyl (VASCEPA PO), Take by mouth., Disp: , Rfl:  .  JANUMET XR 50-1000 MG TB24, TAKE 1 TABLET BY MOUTH TWICE A DAY, Disp: 180 tablet, Rfl: 1 .  levocetirizine (XYZAL) 5 MG tablet, Take 5 mg by mouth every evening., Disp: , Rfl:  .  lisinopril (PRINIVIL,ZESTRIL) 10 MG tablet, TAKE 1 TABLET BY MOUTH EVERY DAY, Disp: 90 tablet, Rfl: 2 .  loratadine (CLARITIN) 10 MG tablet, Take 10 mg by mouth daily as needed. , Disp: , Rfl:  .  meloxicam (MOBIC) 15 MG tablet, Take 15 mg by mouth daily., Disp: , Rfl:  .  mometasone (NASONEX) 50 MCG/ACT nasal spray, SPRAY 2 SPRAYS INTO EACH NOSTRIL EVERY DAY, Disp: 17 g, Rfl: 1 .  naproxen (NAPROSYN) 500 MG tablet, Take 500 mg by mouth as needed. , Disp: , Rfl:  .  omeprazole (PRILOSEC) 40 MG capsule, Take 40 mg by mouth daily., Disp: , Rfl:  .  Vitamin D, Cholecalciferol, 1000 units CAPS, Take by mouth., Disp: , Rfl:    Allergies  Allergen Reactions  . Meloxicam Diarrhea  . Pitavastatin      Review of Systems  Constitutional: Negative.   Eyes: Negative for blurred vision.  Respiratory: Negative.  Negative for shortness of breath.   Cardiovascular: Negative.  Negative for chest pain and palpitations.  Gastrointestinal: Negative.  Neurological: Negative.   Psychiatric/Behavioral: Negative.      Today's Vitals   12/10/18 1209  BP: 116/82  Pulse: 87  Temp: 98.2 F (36.8 C)  TempSrc: Oral  Weight: 206 lb 9.6 oz (93.7 kg)  Height: 5' 6.6" (1.692 m)  PainSc: 0-No pain   Body mass index is 32.75 kg/m.   Objective:  Physical Exam Vitals signs and nursing note reviewed.  Constitutional:      Appearance: Normal appearance.  HENT:     Head: Normocephalic and atraumatic.  Cardiovascular:     Rate and Rhythm: Normal rate and regular rhythm.     Heart sounds: Normal heart sounds.  Pulmonary:      Effort: Pulmonary effort is normal.     Breath sounds: Normal breath sounds.  Skin:    General: Skin is warm.  Neurological:     General: No focal deficit present.     Mental Status: She is alert.  Psychiatric:        Mood and Affect: Mood normal.        Behavior: Behavior normal.         Assessment And Plan:     1. Anemia due to vitamin B12 deficiency, unspecified B12 deficiency type  I will check vitamin B12 level today. She was also given vitamin B12 injection. She will rto in one month for her next injection.  - Vitamin B12  2. Uncontrolled type 2 diabetes mellitus with hyperglycemia ([place])  I will check labs as listed below. She is encouraged to incorporate more exercise into her daily routine.   - Hemoglobin A1c - BMP8+EGFR  3. Essential hypertension, benign  Well controlled. She will continue with current meds. She is encouraged to avoid adding salt to her foods.   4. Class 1 obesity due to excess calories with serious comorbidity and body mass index (BMI) of 32.0 to 32.9 in adult  Importance of achieving optimal weight to decrease risk of cardiovascular disease and cancers was discussed with the patient in full detail. She is encouraged to start slowly - start with 10 minutes twice daily at least three to four days per week and to gradually build to 30 minutes five days weekly. She was given tips to incorporate more activity into her daily routine - take stairs when possible, park farther away from her job, grocery stores, etc.    [name], MD    THE PATIENT IS ENCOURAGED TO PRACTICE SOCIAL DISTANCING DUE TO THE COVID-19 PANDEMIC.

## 2018-12-21 ENCOUNTER — Other Ambulatory Visit: Payer: Self-pay | Admitting: Internal Medicine

## 2018-12-30 ENCOUNTER — Encounter: Payer: Self-pay | Admitting: Internal Medicine

## 2019-01-01 ENCOUNTER — Ambulatory Visit: Payer: Medicare Other | Admitting: Internal Medicine

## 2019-01-12 ENCOUNTER — Ambulatory Visit: Payer: Medicare Other | Admitting: Internal Medicine

## 2019-01-15 ENCOUNTER — Other Ambulatory Visit: Payer: Self-pay | Admitting: Internal Medicine

## 2019-01-19 LAB — HM DIABETES EYE EXAM

## 2019-01-20 ENCOUNTER — Other Ambulatory Visit: Payer: Self-pay | Admitting: Internal Medicine

## 2019-01-22 ENCOUNTER — Other Ambulatory Visit: Payer: Self-pay | Admitting: Internal Medicine

## 2019-01-31 ENCOUNTER — Other Ambulatory Visit: Payer: Self-pay | Admitting: Internal Medicine

## 2019-02-02 ENCOUNTER — Ambulatory Visit: Payer: Medicare Other | Admitting: Internal Medicine

## 2019-02-04 ENCOUNTER — Encounter: Payer: Self-pay | Admitting: Internal Medicine

## 2019-02-05 ENCOUNTER — Ambulatory Visit: Payer: Medicare Other | Admitting: Internal Medicine

## 2019-02-17 ENCOUNTER — Ambulatory Visit (INDEPENDENT_AMBULATORY_CARE_PROVIDER_SITE_OTHER): Payer: Medicare Other | Admitting: Internal Medicine

## 2019-02-17 ENCOUNTER — Encounter: Payer: Self-pay | Admitting: Internal Medicine

## 2019-02-17 ENCOUNTER — Other Ambulatory Visit: Payer: Self-pay

## 2019-02-17 VITALS — BP 118/76 | HR 94 | Temp 98.2°F | Ht 66.8 in | Wt 208.8 lb

## 2019-02-17 DIAGNOSIS — Z0000 Encounter for general adult medical examination without abnormal findings: Secondary | ICD-10-CM | POA: Diagnosis not present

## 2019-02-17 DIAGNOSIS — H9193 Unspecified hearing loss, bilateral: Secondary | ICD-10-CM

## 2019-02-17 DIAGNOSIS — E1165 Type 2 diabetes mellitus with hyperglycemia: Secondary | ICD-10-CM | POA: Diagnosis not present

## 2019-02-17 DIAGNOSIS — Z23 Encounter for immunization: Secondary | ICD-10-CM | POA: Diagnosis not present

## 2019-02-17 DIAGNOSIS — I10 Essential (primary) hypertension: Secondary | ICD-10-CM | POA: Diagnosis not present

## 2019-02-17 DIAGNOSIS — E2839 Other primary ovarian failure: Secondary | ICD-10-CM

## 2019-02-17 DIAGNOSIS — L659 Nonscarring hair loss, unspecified: Secondary | ICD-10-CM

## 2019-02-17 NOTE — Progress Notes (Signed)
Subjective:    Michele Pope is a 68 y.o. female who presents for a Welcome to Medicare exam.   Diabetes She presents for her follow-up diabetic visit. She has type 2 diabetes mellitus. There are no hypoglycemic associated symptoms. Pertinent negatives for diabetes include no blurred vision and no chest pain. There are no hypoglycemic complications. Risk factors for coronary artery disease include diabetes mellitus, dyslipidemia, hypertension and post-menopausal. She is following a diabetic diet. She participates in exercise intermittently. There is no change in her home blood glucose trend. An ACE inhibitor/angiotensin II receptor blocker is being taken.  Hypertension This is a chronic problem. The current episode started more than 1 year ago. The problem has been gradually improving since onset. The problem is controlled. Pertinent negatives include no blurred vision, chest pain, palpitations or shortness of breath. The current treatment provides moderate improvement.     Review of Systems  Review of Systems  Constitutional: Negative.   HENT: Negative.   Eyes: Negative.  Negative for blurred vision.  Respiratory: Negative.  Negative for shortness of breath.   Cardiovascular: Negative.  Negative for chest pain and palpitations.  Gastrointestinal: Negative.   Genitourinary: Negative.   Musculoskeletal: Negative.   Skin: Negative.   Neurological: Negative.   Endo/Heme/Allergies: Negative.   Psychiatric/Behavioral: Negative.    Cardiac Risk Factors include: diabetes mellitus;hypertension;obesity (BMI >30kg/m2);advanced age (>61mn, >>44women)      Objective:    Today's Vitals   02/17/19 1523 02/17/19 1605  BP: 118/76   Pulse: 94   Temp: 98.2 F (36.8 C)   TempSrc: Oral   Weight: 208 lb 12.8 oz (94.7 kg)   Height: 5' 6.8" (1.697 m)   PainSc: 0-No pain 0-No pain  Body mass index is 32.9 kg/m.  Physical Exam  Constitutional: She is oriented to person, place, and time  and well-developed, well-nourished, and in no distress.  HENT:  Head: Normocephalic and atraumatic.  Right Ear: External ear normal.  Left Ear: External ear normal.  Nose: Nose normal.  Mouth/Throat: Oropharynx is clear and moist.  Eyes: Pupils are equal, round, and reactive to light. Conjunctivae and EOM are normal.  Neck: Normal range of motion. Neck supple.  Cardiovascular: Normal rate, regular rhythm, normal heart sounds and intact distal pulses.  Pulmonary/Chest: Effort normal and breath sounds normal. Right breast exhibits no inverted nipple, no mass, no nipple discharge, no skin change and no tenderness. Left breast exhibits no inverted nipple, no mass, no nipple discharge, no skin change and no tenderness.  Abdominal: Soft. Bowel sounds are normal.  Genitourinary:    Genitourinary Comments: deferred   Musculoskeletal: Normal range of motion.  Neurological: She is alert and oriented to person, place, and time. She has normal reflexes. Gait normal. GCS score is 15.  Skin: Skin is warm.  Psychiatric: Affect normal.  Nursing note and vitals reviewed.  Diabetic Foot Exam - Simple   Simple Foot Form Diabetic Foot exam was performed with the following findings: Yes 02/17/2019  4:26 PM  Visual Inspection Sensation Testing Pulse Check Comments      Medications Outpatient Encounter Medications as of 02/17/2019  Medication Sig  . Blood Glucose Monitoring Suppl (ONE TOUCH ULTRA 2) w/Device KIT Use as directed to check blood sugars 1 time per day dx: e11.22  . Cholecalciferol (VITAMIN D3) 125 MCG (5000 UT) CAPS Take by mouth.  . diclofenac sodium (VOLTAREN) 1 % GEL Apply topically 4 (four) times daily.  .Marland Kitchenezetimibe (ZETIA) 10 MG tablet TAKE  1 TABLET BY MOUTH EVERY DAY  . glucose blood (ONE TOUCH ULTRA TEST) test strip Use as instructed to check blood sugars 1 time per day dx: e11.65  . Icosapent Ethyl (VASCEPA PO) Take by mouth.  Marland Kitchen JANUMET XR 50-1000 MG TB24 TAKE 1 TABLET BY MOUTH  TWICE A DAY  . levocetirizine (XYZAL) 5 MG tablet Take 5 mg by mouth every evening.  Marland Kitchen lisinopril (PRINIVIL,ZESTRIL) 10 MG tablet TAKE 1 TABLET BY MOUTH EVERY DAY  . loratadine (CLARITIN) 10 MG tablet TAKE 1 TABLET BY MOUTH EVERY DAY  . mometasone (NASONEX) 50 MCG/ACT nasal spray SPRAY 2 SPRAYS INTO EACH NOSTRIL EVERY DAY  . naproxen (NAPROSYN) 500 MG tablet Take 500 mg by mouth as needed.   Marland Kitchen omeprazole (PRILOSEC) 40 MG capsule Take 40 mg by mouth daily.  . [DISCONTINUED] meloxicam (MOBIC) 15 MG tablet Take 15 mg by mouth daily.  . [DISCONTINUED] Vitamin D, Cholecalciferol, 1000 units CAPS Take by mouth.   Facility-Administered Encounter Medications as of 02/17/2019  Medication  . cyanocobalamin ((VITAMIN B-12)) injection 1,000 mcg     History: Past Medical History:  Diagnosis Date  . Diabetes mellitus without complication ([place])   . High cholesterol   . HTN (hypertension)    Past Surgical History:  Procedure Laterality Date  . BREAST EXCISIONAL BIOPSY Right   . SHOULDER ARTHROSCOPY W/ ROTATOR CUFF REPAIR  08/22/2018  . TOTAL ABDOMINAL HYSTERECTOMY  1995    Family History  Problem Relation Age of Onset  . Heart attack Mother   . Blindness Father    Social History   Occupational History  . Not on file  Tobacco Use  . Smoking status: Never Smoker  . Smokeless tobacco: Never Used  Substance and Sexual Activity  . Alcohol use: Never    Frequency: Never  . Drug use: Never  . Sexual activity: Not on file    Tobacco Counseling Counseling given: Not Answered   Immunizations and Health Maintenance Immunization History  Administered Date(s) Administered  . Influenza, High Dose Seasonal PF 05/14/2018  . Pneumococcal Polysaccharide-23 05/03/2009, 02/17/2019  . Tdap 09/15/2018   Health Maintenance Due  Topic Date Due  . PAP SMEAR-Modifier  04/16/1974  . DEXA SCAN  04/16/2018    Activities of Daily Living In your present state of health, do you have any difficulty  performing the following activities: 02/17/2019  Hearing? Y  Vision? N  Difficulty concentrating or making decisions? N  Walking or climbing stairs? Y  Dressing or bathing? N  Doing errands, shopping? N  Preparing Food and eating ? N  Using the Toilet? N  In the past six months, have you accidently leaked urine? N  Do you have problems with loss of bowel control? N  Managing your Medications? N  Managing your Finances? N  Housekeeping or managing your Housekeeping? N  Some recent data might be hidden    Advanced Directives: Does Patient Have a Medical Advance Directive?: No Would patient like information on creating a medical advance directive?: No - Patient declined    Assessment:    This is a routine wellness examination for this patient .   Vision/Hearing screen  Hearing Screening   Method: Audiometry   125Hz  250Hz  500Hz  1000Hz  2000Hz  3000Hz  4000Hz  6000Hz  8000Hz   Right ear:  25 25 0 0  0    Left ear:  25 25 0 0  0      Visual Acuity Screening   Right eye Left eye Both eyes  Without correction:     With correction: 20/30 203/30 20/30    Dietary issues and exercise activities discussed:  Current Exercise Habits: The patient does not participate in regular exercise at present, Exercise limited by: None identified  Goals    . Increase physical activity      Depression Screen PHQ 2/9 Scores 02/17/2019 11/11/2018 09/15/2018 06/27/2018  PHQ - 2 Score 0 0 0 0     Fall Risk Fall Risk  12/10/2018  Falls in the past year? 1  Number falls in past yr: 0  Injury with Fall? 1    Cognitive Function:     6CIT Screen 02/17/2019  What Year? 0 points  What month? 0 points  What time? 0 points  Count back from 20 0 points  Months in reverse 0 points  Repeat phrase 0 points  Total Score 0    Patient Care Team: Glendale Chard, MD as PCP - General (Internal Medicine)     Plan:  1. Encounter for Medicare annual wellness exam  A full exam was performed. Importance of  monthly self breast exams was discussed with the patient. The annual wellness visit was performed including discussion of advanced directives, assessment of functional status and cognitive function. PATIENT HAS BEEN ADVISED TO GET 30-45 MINUTES REGULAR EXERCISE NO LESS THAN FOUR TO FIVE DAYS PER WEEK - BOTH WEIGHTBEARING EXERCISES AND AEROBIC ARE RECOMMENDED.  SHE IS ADVISED TO FOLLOW A HEALTHY DIET WITH AT LEAST SIX FRUITS/VEGGIES PER DAY, DECREASE INTAKE OF RED MEAT, AND TO INCREASE FISH INTAKE TO TWO DAYS PER WEEK.  MEATS/FISH SHOULD NOT BE FRIED, BAKED OR BROILED IS PREFERABLE.  I SUGGEST WEARING SPF 50 SUNSCREEN ON EXPOSED PARTS AND ESPECIALLY WHEN IN THE DIRECT SUNLIGHT FOR AN EXTENDED PERIOD OF TIME.  PLEASE AVOID FAST FOOD RESTAURANTS AND INCREASE YOUR WATER INTAKE.   2. Uncontrolled type 2 diabetes mellitus with hyperglycemia ([facility])  Diabetic foot exam was performed. I DISCUSSED WITH THE PATIENT AT LENGTH REGARDING THE GOALS OF GLYCEMIC CONTROL AND POSSIBLE LONG-TERM COMPLICATIONS.  I  ALSO STRESSED THE IMPORTANCE OF COMPLIANCE WITH HOME GLUCOSE MONITORING, DIETARY RESTRICTIONS INCLUDING AVOIDANCE OF SUGARY DRINKS/PROCESSED FOODS,  ALONG WITH REGULAR EXERCISE.  I  ALSO STRESSED THE IMPORTANCE OF ANNUAL EYE EXAMS, SELF FOOT CARE AND COMPLIANCE WITH OFFICE VISITS.  - CBC no Diff  3. Essential hypertension, benign  Chronic, well controlled. She will continue with current meds. She is encouraged to avoid adding salt to her foods. EKG performed, no acute changes are noted.  - EKG 12-Lead  4. Decreased hearing of both ears  Audiometry performed today. I will refer her to ENT for further evaluation and formal audiometry testing.   - Ambulatory referral to ENT  5. Hair loss  I will check labs as listed below. I will also request records from dermatologist.   - Testosterone, Total - ANA  6. Estrogen deficiency  Importance of engaging in weightbearing exercises at least three days weekly,  along with calcium and vitamin D supplement was discussed with the patient.  - DG Bone Density; Future     I have personally reviewed and noted the following in the patient's chart:   . Medical and social history . Use of alcohol, tobacco or illicit drugs  . Current medications and supplements . Functional ability and status . Nutritional status . Physical activity . Advanced directives . List of other physicians . Hospitalizations, surgeries, and ER visits in previous 12 months . Vitals . Screenings to include  cognitive, depression, and falls . Referrals and appointments  In addition, I have reviewed and discussed with patient certain preventive protocols, quality metrics, and best practice recommendations. A written personalized care plan for preventive services as well as general preventive health recommendations were provided to patient.     [name], MD 02/17/2019

## 2019-02-17 NOTE — Patient Instructions (Signed)
  Michele Pope , Thank you for taking time to come for your Medicare Wellness Visit. I appreciate your ongoing commitment to your health goals. Please review the following plan we discussed and let me know if I can assist you in the future.   These are the goals we discussed: Goals    . Increase physical activity       This is a list of the screening recommended for you and due dates:  Health Maintenance  Topic Date Due  . Pap Smear  04/16/1974  . DEXA scan (bone density measurement)  04/16/2018  . HIV Screening  09/16/2019*  . Flu Shot  02/21/2019  . Hemoglobin A1C  06/12/2019  . Eye exam for diabetics  01/19/2020  . Complete foot exam   02/17/2020  . Mammogram  09/25/2020  . Colon Cancer Screening  01/12/2026  . Tetanus Vaccine  09/15/2028  .  Hepatitis C: One time screening is recommended by Center for Disease Control  (CDC) for  adults born from [birth year] through 1963.   Completed  . Pneumonia vaccines  Completed  *Topic was postponed. The date shown is not the original due date.

## 2019-02-18 LAB — CBC
Hematocrit: 41.6 % (ref 34.0–46.6)
Hemoglobin: 14.1 g/dL (ref 11.1–15.9)
MCH: 26.2 pg — ABNORMAL LOW (ref 26.6–33.0)
MCHC: 33.9 g/dL (ref 31.5–35.7)
MCV: 77 fL — ABNORMAL LOW (ref 79–97)
Platelets: 332 10*3/uL (ref 150–450)
RBC: 5.39 x10E6/uL — ABNORMAL HIGH (ref 3.77–5.28)
RDW: 15.3 % (ref 11.7–15.4)
WBC: 7.9 10*3/uL (ref 3.4–10.8)

## 2019-02-18 LAB — ANA: ANA Titer 1: NEGATIVE

## 2019-02-18 LAB — TESTOSTERONE: Testosterone: <3 ng/dL — ABNORMAL LOW (ref 3–41)

## 2019-02-22 ENCOUNTER — Encounter: Payer: Self-pay | Admitting: Internal Medicine

## 2019-02-24 ENCOUNTER — Encounter: Payer: Self-pay | Admitting: Internal Medicine

## 2019-03-19 ENCOUNTER — Ambulatory Visit (INDEPENDENT_AMBULATORY_CARE_PROVIDER_SITE_OTHER): Payer: PRIVATE HEALTH INSURANCE | Admitting: Otolaryngology

## 2019-03-19 DIAGNOSIS — H6122 Impacted cerumen, left ear: Secondary | ICD-10-CM

## 2019-03-19 DIAGNOSIS — H6983 Other specified disorders of Eustachian tube, bilateral: Secondary | ICD-10-CM | POA: Diagnosis not present

## 2019-03-19 DIAGNOSIS — J310 Chronic rhinitis: Secondary | ICD-10-CM

## 2019-03-19 DIAGNOSIS — H903 Sensorineural hearing loss, bilateral: Secondary | ICD-10-CM | POA: Diagnosis not present

## 2019-03-22 ENCOUNTER — Other Ambulatory Visit: Payer: Self-pay | Admitting: Internal Medicine

## 2019-03-25 ENCOUNTER — Other Ambulatory Visit: Payer: Self-pay | Admitting: Internal Medicine

## 2019-03-27 ENCOUNTER — Encounter: Payer: Self-pay | Admitting: Internal Medicine

## 2019-03-31 ENCOUNTER — Other Ambulatory Visit: Payer: Self-pay | Admitting: Internal Medicine

## 2019-03-31 MED ORDER — FLUCONAZOLE 150 MG PO TABS: 150.0000 mg | ORAL_TABLET | Freq: Every day | ORAL | 0 refills | Status: DC

## 2019-03-31 NOTE — Telephone Encounter (Signed)
Pt states that she has a yeast infection. Told her she would need an appt she stated that you usually just sends her something.

## 2019-04-01 ENCOUNTER — Telehealth: Payer: Medicare Other | Admitting: Family

## 2019-04-01 DIAGNOSIS — B3731 Acute candidiasis of vulva and vagina: Secondary | ICD-10-CM

## 2019-04-01 DIAGNOSIS — B373 Candidiasis of vulva and vagina: Secondary | ICD-10-CM

## 2019-04-01 MED ORDER — FLUCONAZOLE 150 MG PO TABS: 150.0000 mg | ORAL_TABLET | ORAL | 0 refills | Status: DC | PRN

## 2019-04-01 NOTE — Progress Notes (Signed)
We are sorry that you are not feeling well. Here is how we plan to help! Based on what you shared with me it looks like you: May have a yeast vaginosis  Vaginosis is an inflammation of the vagina that can result in discharge, itching and pain. The cause is usually a change in the normal balance of vaginal bacteria or an infection. Vaginosis can also result from reduced estrogen levels after menopause.  The most common causes of vaginosis are:   Bacterial vaginosis which results from an overgrowth of one on several organisms that are normally present in your vagina.   Yeast infections which are caused by a naturally occurring fungus called candida.   Vaginal atrophy (atrophic vaginosis) which results from the thinning of the vagina from reduced estrogen levels after menopause.   Trichomoniasis which is caused by a parasite and is commonly transmitted by sexual intercourse.  Factors that increase your risk of developing vaginosis include: Marland Kitchen. Medications, such as antibiotics and steroids . Uncontrolled diabetes . Use of hygiene products such as bubble bath, vaginal spray or vaginal deodorant . Douching . Wearing damp or tight-fitting clothing . Using an intrauterine device (IUD) for birth control . Hormonal changes, such as those associated with pregnancy, birth control pills or menopause . Sexual activity . Having a sexually transmitted infection  Your treatment plan is A single Diflucan (fluconazole) 150mg  tablet once.  I have electronically sent this prescription into the pharmacy that you have chosen.   It does look like your PCP Dr. Allyne GeeSanders already sent you in a Difluican, but I did send in a rx for one too.   Approximately 5 minutes was spent documenting and reviewing patient's chart.    Be sure to take all of the medication as directed. Stop taking any medication if you develop a rash, tongue swelling or shortness of breath. Mothers who are breast feeding should consider pumping  and discarding their breast milk while on these antibiotics. However, there is no consensus that infant exposure at these doses would be harmful.  Remember that medication creams can weaken latex condoms. Marland Kitchen.   HOME CARE:  Good hygiene may prevent some types of vaginosis from recurring and may relieve some symptoms:  . Avoid baths, hot tubs and whirlpool spas. Rinse soap from your outer genital area after a shower, and dry the area well to prevent irritation. Don't use scented or harsh soaps, such as those with deodorant or antibacterial action. Marland Kitchen. Avoid irritants. These include scented tampons and pads. . Wipe from front to back after using the toilet. Doing so avoids spreading fecal bacteria to your vagina.  Other things that may help prevent vaginosis include:  Marland Kitchen. Don't douche. Your vagina doesn't require cleansing other than normal bathing. Repetitive douching disrupts the normal organisms that reside in the vagina and can actually increase your risk of vaginal infection. Douching won't clear up a vaginal infection. . Use a latex condom. Both female and female latex condoms may help you avoid infections spread by sexual contact. . Wear cotton underwear. Also wear pantyhose with a cotton crotch. If you feel comfortable without it, skip wearing underwear to bed. Yeast thrives in Hilton Hotelsmoist environments Your symptoms should improve in the next day or two.  GET HELP RIGHT AWAY IF:  . You have pain in your lower abdomen ( pelvic area or over your ovaries) . You develop nausea or vomiting . You develop a fever . Your discharge changes or worsens . You have persistent pain  with intercourse . You develop shortness of breath, a rapid pulse, or you faint.  These symptoms could be signs of problems or infections that need to be evaluated by a medical provider now.  MAKE SURE YOU    Understand these instructions.  Will watch your condition.  Will get help right away if you are not doing well or  get worse.  Your e-visit answers were reviewed by a board certified advanced clinical practitioner to complete your personal care plan. Depending upon the condition, your plan could have included both over the counter or prescription medications. Please review your pharmacy choice to make sure that you have choses a pharmacy that is open for you to pick up any needed prescription, Your safety is important to [place]. If you have drug allergies check your prescription carefully.   You can use MyChart to ask questions about today's visit, request a non-urgent call back, or ask for a work or school excuse for 24 hours related to this e-Visit. If it has been greater than 24 hours you will need to follow up with your provider, or enter a new e-Visit to address those concerns. You will get a MyChart message within the next two days asking about your experience. I hope that your e-visit has been valuable and will speed your recovery.

## 2019-04-20 ENCOUNTER — Other Ambulatory Visit: Payer: Self-pay | Admitting: Internal Medicine

## 2019-04-30 ENCOUNTER — Ambulatory Visit (INDEPENDENT_AMBULATORY_CARE_PROVIDER_SITE_OTHER): Payer: Medicare Other | Admitting: Otolaryngology

## 2019-04-30 ENCOUNTER — Other Ambulatory Visit: Payer: Self-pay

## 2019-04-30 DIAGNOSIS — H6983 Other specified disorders of Eustachian tube, bilateral: Secondary | ICD-10-CM | POA: Diagnosis not present

## 2019-04-30 DIAGNOSIS — J342 Deviated nasal septum: Secondary | ICD-10-CM

## 2019-04-30 DIAGNOSIS — H903 Sensorineural hearing loss, bilateral: Secondary | ICD-10-CM | POA: Diagnosis not present

## 2019-04-30 DIAGNOSIS — J310 Chronic rhinitis: Secondary | ICD-10-CM | POA: Diagnosis not present

## 2019-04-30 DIAGNOSIS — J343 Hypertrophy of nasal turbinates: Secondary | ICD-10-CM

## 2019-05-08 ENCOUNTER — Ambulatory Visit
Admission: RE | Admit: 2019-05-08 | Discharge: 2019-05-08 | Disposition: A | Discharge status: Home / Self Care | Payer: Medicare Other | Source: Ambulatory Visit | Attending: Internal Medicine | Admitting: Internal Medicine

## 2019-05-08 ENCOUNTER — Other Ambulatory Visit: Payer: Self-pay

## 2019-05-08 DIAGNOSIS — E2839 Other primary ovarian failure: Secondary | ICD-10-CM

## 2019-05-12 ENCOUNTER — Other Ambulatory Visit: Payer: Self-pay | Admitting: Otolaryngology

## 2019-05-12 ENCOUNTER — Other Ambulatory Visit (HOSPITAL_COMMUNITY): Payer: Self-pay | Admitting: Otolaryngology

## 2019-05-12 DIAGNOSIS — J329 Chronic sinusitis, unspecified: Secondary | ICD-10-CM

## 2019-05-27 ENCOUNTER — Ambulatory Visit (INDEPENDENT_AMBULATORY_CARE_PROVIDER_SITE_OTHER): Payer: Medicare Other | Admitting: Internal Medicine

## 2019-05-27 ENCOUNTER — Encounter: Payer: Self-pay | Admitting: Internal Medicine

## 2019-05-27 ENCOUNTER — Other Ambulatory Visit: Payer: Self-pay

## 2019-05-27 VITALS — BP 124/76 | HR 97 | Temp 97.8°F | Ht 66.8 in | Wt 204.6 lb

## 2019-05-27 DIAGNOSIS — E1165 Type 2 diabetes mellitus with hyperglycemia: Secondary | ICD-10-CM

## 2019-05-27 DIAGNOSIS — E6609 Other obesity due to excess calories: Secondary | ICD-10-CM | POA: Diagnosis not present

## 2019-05-27 DIAGNOSIS — I10 Essential (primary) hypertension: Secondary | ICD-10-CM

## 2019-05-27 DIAGNOSIS — E66811 Obesity, class 1: Secondary | ICD-10-CM

## 2019-05-27 DIAGNOSIS — D519 Vitamin B12 deficiency anemia, unspecified: Secondary | ICD-10-CM

## 2019-05-27 DIAGNOSIS — Z6832 Body mass index (BMI) 32.0-32.9, adult: Secondary | ICD-10-CM

## 2019-05-27 MED ORDER — CYANOCOBALAMIN 1000 MCG/ML IJ SOLN
1000.0000 ug | Freq: Once | INTRAMUSCULAR | Status: AC
  Administered 2019-05-27: 1000 ug via INTRAMUSCULAR

## 2019-05-28 ENCOUNTER — Ambulatory Visit (HOSPITAL_COMMUNITY)
Admission: RE | Admit: 2019-05-28 | Discharge: 2019-05-28 | Disposition: A | Discharge status: Home / Self Care | Payer: Medicare Other | Source: Ambulatory Visit | Attending: Otolaryngology | Admitting: Otolaryngology

## 2019-05-28 DIAGNOSIS — J329 Chronic sinusitis, unspecified: Secondary | ICD-10-CM | POA: Insufficient documentation

## 2019-05-28 LAB — CMP14+EGFR
ALT: 48 IU/L — ABNORMAL HIGH (ref 0–32)
AST: 52 IU/L — ABNORMAL HIGH (ref 0–40)
Albumin/Globulin Ratio: 1.5 (ref 1.2–2.2)
Albumin: 4.5 g/dL (ref 3.8–4.8)
Alkaline Phosphatase: 73 IU/L (ref 39–117)
BUN/Creatinine Ratio: 10 — ABNORMAL LOW (ref 12–28)
BUN: 9 mg/dL (ref 8–27)
Bilirubin Total: 0.5 mg/dL (ref 0.0–1.2)
CO2: 23 mmol/L (ref 20–29)
Calcium: 10.2 mg/dL (ref 8.7–10.3)
Chloride: 101 mmol/L (ref 96–106)
Creatinine, Ser: 0.91 mg/dL (ref 0.57–1.00)
GFR calc Af Amer: 76 mL/min/{1.73_m2} (ref >59)
GFR calc non Af Amer: 66 mL/min/{1.73_m2} (ref >59)
Globulin, Total: 3.1 g/dL (ref 1.5–4.5)
Glucose: 104 mg/dL — ABNORMAL HIGH (ref 65–99)
Potassium: 4.1 mmol/L (ref 3.5–5.2)
Sodium: 139 mmol/L (ref 134–144)
Total Protein: 7.6 g/dL (ref 6.0–8.5)

## 2019-05-30 IMAGING — CR DG ELBOW COMPLETE 3+V*R*
4 series · 4 of 4 positions shown · non-contrast
Comparison: None.

CLINICAL DATA: Patient stated she [place] in the movie theatre.
Patient in severe pain and wasn't willing to be put in certain
positions in order to obtain a diagnostic image

EXAM:
RIGHT ELBOW - COMPLETE 3+ VIEW

[elbow ap]
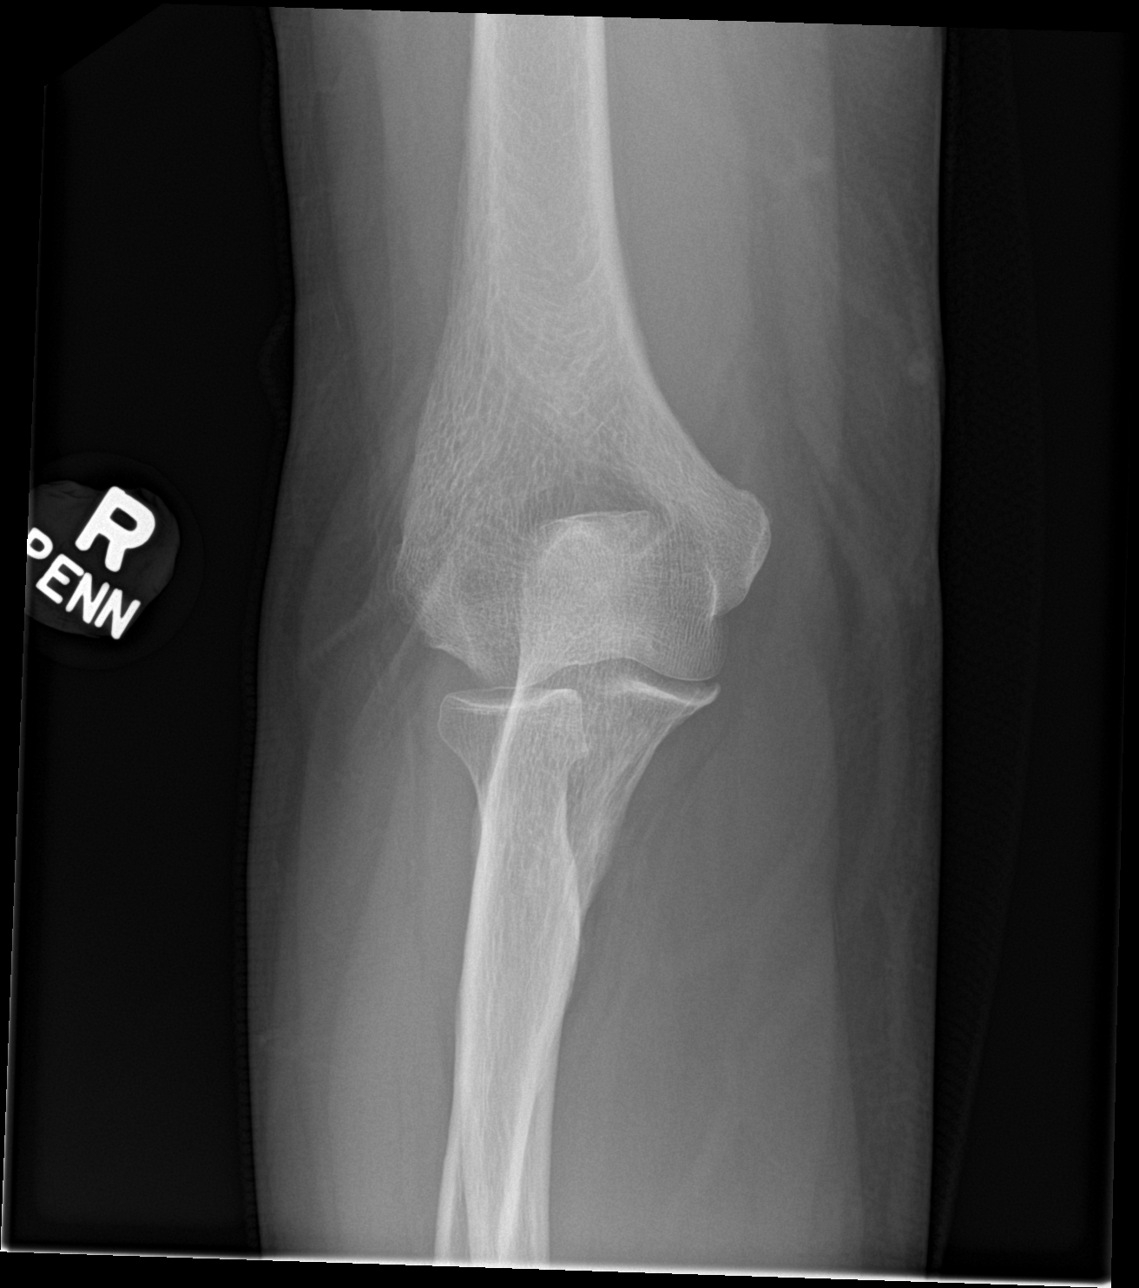

[elbow obl (1 of 2)]
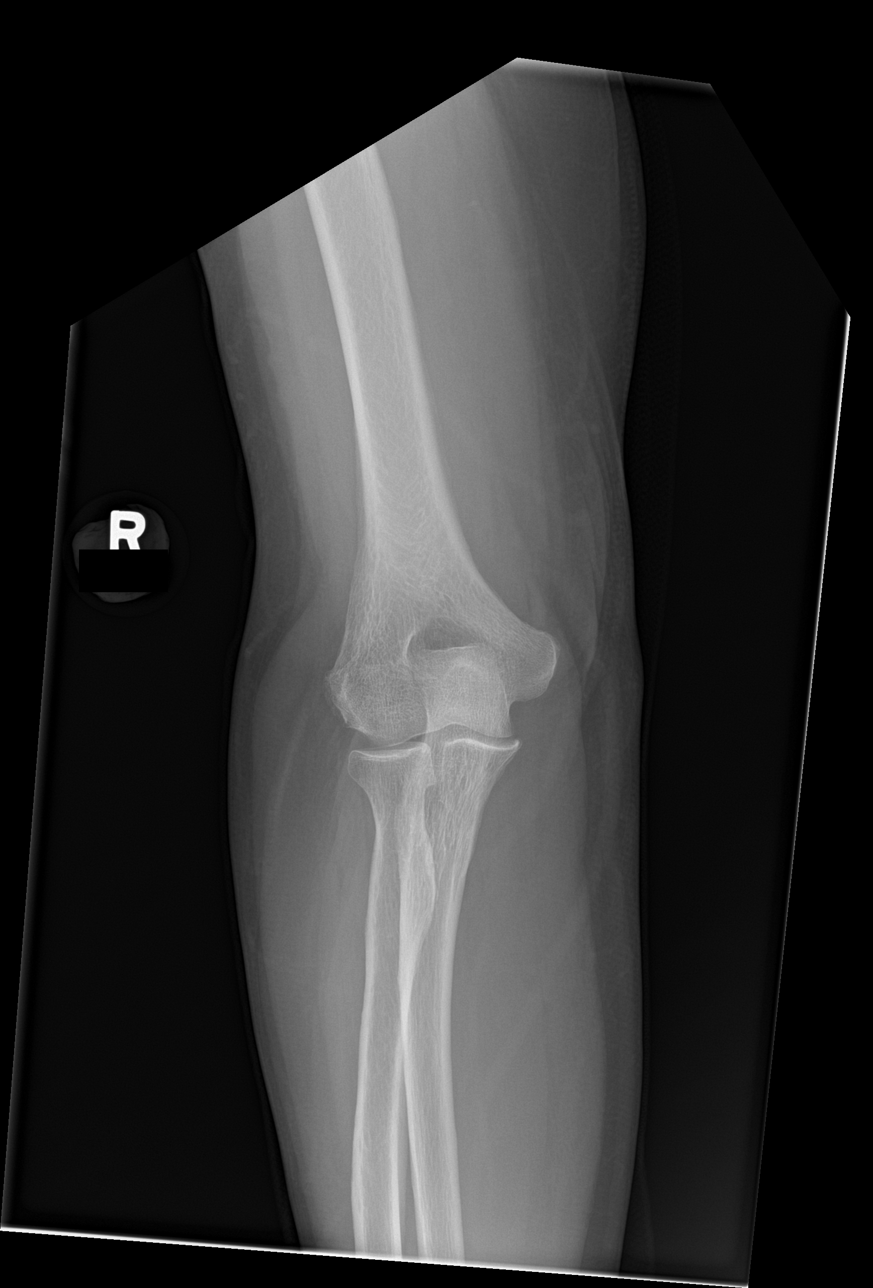

[elbow obl (2 of 2)]
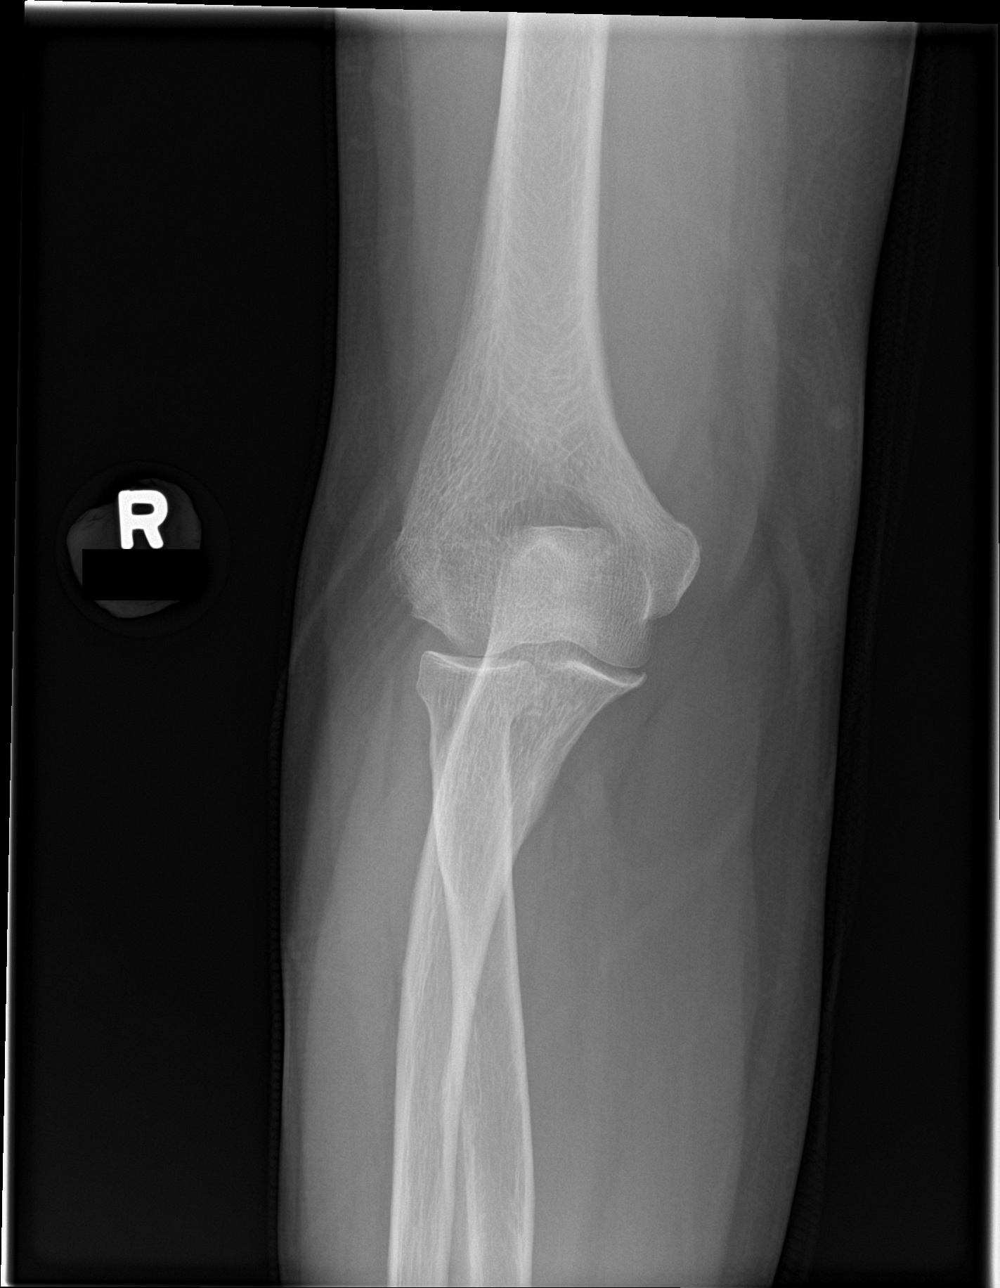

[elbow lat]
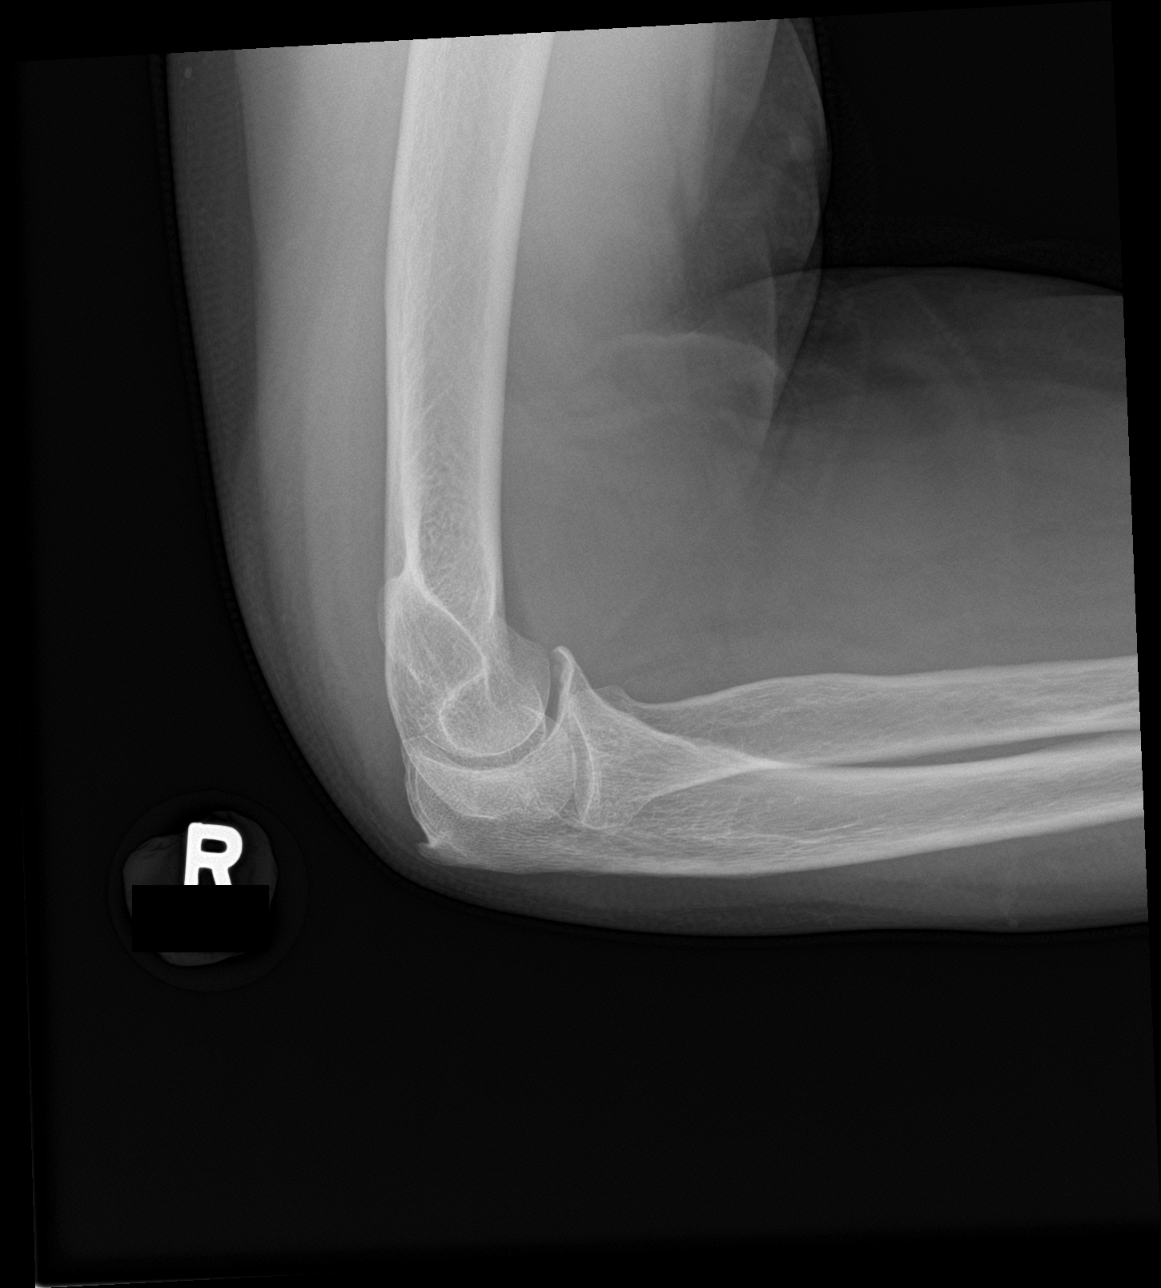

[4 of 4 positions shown; findings below may reference images not displayed]

FINDINGS: No fracture or bone lesion.

Elbow joint normally spaced and aligned. Minor spurring from the
ulna the ulnar trochlear articulation. No other arthropathic change.
No joint effusion.

Soft tissues are unremarkable.
IMPRESSION: No fracture or dislocation.

## 2019-05-31 NOTE — Progress Notes (Signed)
Subjective:     Patient ID: Michele Pope , female    DOB: 29-Oct-1950 , 68 y.o.   MRN: [id number]   Chief Complaint  Patient presents with  . Diabetes  . B12 Injection  . Hypertension    HPI  She is here today for DM, HTN check. She would also like to get a vitamin B12 injection during today's visit.   Diabetes She presents for her follow-up diabetic visit. She has type 2 diabetes mellitus. There are no hypoglycemic associated symptoms. Pertinent negatives for diabetes include no blurred vision and no chest pain. There are no hypoglycemic complications. Risk factors for coronary artery disease include diabetes mellitus, dyslipidemia, hypertension and post-menopausal. She does not see a podiatrist.Eye exam is current.  Hypertension This is a chronic problem. The current episode started more than 1 year ago. The problem has been gradually improving since onset. The problem is controlled. Pertinent negatives include no blurred vision, chest pain, palpitations or shortness of breath. Past treatments include ACE inhibitors. The current treatment provides moderate improvement.     Past Medical History:  Diagnosis Date  . Diabetes mellitus without complication ([place])   . High cholesterol   . HTN (hypertension)      Family History  Problem Relation Age of Onset  . Heart attack Mother   . Blindness Father      Current Outpatient Medications:  .  Apoaequorin (PREVAGEN PO), Take by mouth., Disp: , Rfl:  .  Blood Glucose Monitoring Suppl (ONE TOUCH ULTRA 2) w/Device KIT, Use as directed to check blood sugars 1 time per day dx: e11.22, Disp: 1 each, Rfl: 1 .  Cholecalciferol (VITAMIN D3) 125 MCG (5000 UT) CAPS, Take by mouth., Disp: , Rfl:  .  diclofenac sodium (VOLTAREN) 1 % GEL, Apply topically 4 (four) times daily., Disp: , Rfl:  .  ezetimibe (ZETIA) 10 MG tablet, TAKE 1 TABLET BY MOUTH EVERY DAY, Disp: 90 tablet, Rfl: 1 .  glucose blood (ONE TOUCH ULTRA TEST) test strip, Use as  instructed to check blood sugars 1 time per day dx: e11.65, Disp: 100 each, Rfl: 11 .  JANUMET XR 50-1000 MG TB24, TAKE 1 TABLET BY MOUTH TWICE A DAY, Disp: 180 tablet, Rfl: 1 .  levocetirizine (XYZAL) 5 MG tablet, Take 5 mg by mouth every evening., Disp: , Rfl:  .  lisinopril (ZESTRIL) 10 MG tablet, TAKE 1 TABLET BY MOUTH EVERY DAY, Disp: 90 tablet, Rfl: 1 .  loratadine (CLARITIN) 10 MG tablet, TAKE 1 TABLET BY MOUTH EVERY DAY, Disp: 90 tablet, Rfl: 1 .  mometasone (NASONEX) 50 MCG/ACT nasal spray, SPRAY 2 SPRAYS INTO EACH NOSTRIL EVERY DAY, Disp: 17 g, Rfl: 1 .  naproxen (NAPROSYN) 500 MG tablet, Take 500 mg by mouth as needed. , Disp: , Rfl:  .  omeprazole (PRILOSEC) 40 MG capsule, TAKE 1 CAPSULE BY MOUTH EVERY DAY BEFORE A MEAL, Disp: 90 capsule, Rfl: 2 .  VASCEPA 1 g CAPS, TAKE 2 CAPSULES BY MOUTH TWICE A DAY WITH FOOD -DO NOT CHEW, OPEN, DISSOLVE OR CRUSH, Disp: 120 capsule, Rfl: 1   Allergies  Allergen Reactions  . Meloxicam Diarrhea  . Pitavastatin      Review of Systems  Constitutional: Negative.   Eyes: Negative for blurred vision.  Respiratory: Negative.  Negative for shortness of breath.   Cardiovascular: Negative.  Negative for chest pain and palpitations.  Gastrointestinal: Negative.   Neurological: Negative.   Psychiatric/Behavioral: Negative.      Today's Vitals  05/27/19 1606  BP: 124/76  Pulse: 97  Temp: 97.8 F (36.6 C)  TempSrc: Oral  Weight: 204 lb 9.6 oz (92.8 kg)  Height: 5' 6.8" (1.697 m)  PainSc: 0-No pain   Body mass index is 32.24 kg/m.   Objective:  Physical Exam Vitals signs and nursing note reviewed.  Constitutional:      Appearance: Normal appearance.  HENT:     Head: Normocephalic and atraumatic.  Cardiovascular:     Rate and Rhythm: Normal rate and regular rhythm.     Heart sounds: Normal heart sounds.  Pulmonary:     Effort: Pulmonary effort is normal.     Breath sounds: Normal breath sounds.  Skin:    General: Skin is warm.   Neurological:     General: No focal deficit present.     Mental Status: She is alert.  Psychiatric:        Mood and Affect: Mood normal.        Behavior: Behavior normal.         Assessment And Plan:     1. Uncontrolled type 2 diabetes mellitus with hyperglycemia ([place])  I will check labs as listed below.   She is encouraged to incorporate more exercise into her daily routine.   - CMP14+EGFR - Hemoglobin A1c  2. Anemia due to vitamin B12 deficiency, unspecified B12 deficiency type  She was given vitamin b12 IM x 1. She will rto in 3 months for re-evaluation.   - cyanocobalamin ((VITAMIN B-12)) injection 1,000 mcg  3. Essential hypertension, benign  Chronic, well controlled. She will continue with current meds. She is encouraged to avoid adding salt to her foods.   4. Class 1 obesity due to excess calories with serious comorbidity and body mass index (BMI) of 32.0 to 32.9 in adult  She is encouraged to strive to lose ten percent of her body weight to decrease cardiac risk.   [name], MD    THE PATIENT IS ENCOURAGED TO PRACTICE SOCIAL DISTANCING DUE TO THE COVID-19 PANDEMIC.

## 2019-06-01 ENCOUNTER — Encounter: Payer: Self-pay | Admitting: Internal Medicine

## 2019-07-11 ENCOUNTER — Other Ambulatory Visit: Payer: Self-pay | Admitting: Internal Medicine

## 2019-07-12 ENCOUNTER — Other Ambulatory Visit: Payer: Self-pay | Admitting: Internal Medicine

## 2019-07-22 ENCOUNTER — Other Ambulatory Visit: Payer: Self-pay | Admitting: Internal Medicine

## 2019-07-27 ENCOUNTER — Other Ambulatory Visit: Payer: Self-pay | Admitting: Internal Medicine

## 2019-07-28 ENCOUNTER — Other Ambulatory Visit: Payer: Self-pay | Admitting: Internal Medicine

## 2019-08-16 ENCOUNTER — Other Ambulatory Visit: Payer: Self-pay | Admitting: Internal Medicine

## 2019-08-31 ENCOUNTER — Encounter: Payer: Self-pay | Admitting: Internal Medicine

## 2019-08-31 ENCOUNTER — Ambulatory Visit: Payer: Medicare Other | Admitting: Internal Medicine

## 2019-08-31 ENCOUNTER — Other Ambulatory Visit: Payer: Self-pay

## 2019-08-31 VITALS — Temp 98.1°F | Ht 66.8 in | Wt 206.4 lb

## 2019-08-31 DIAGNOSIS — R519 Headache, unspecified: Secondary | ICD-10-CM | POA: Diagnosis not present

## 2019-08-31 DIAGNOSIS — E6609 Other obesity due to excess calories: Secondary | ICD-10-CM

## 2019-08-31 DIAGNOSIS — R42 Dizziness and giddiness: Secondary | ICD-10-CM

## 2019-08-31 DIAGNOSIS — K219 Gastro-esophageal reflux disease without esophagitis: Secondary | ICD-10-CM

## 2019-08-31 DIAGNOSIS — Z6832 Body mass index (BMI) 32.0-32.9, adult: Secondary | ICD-10-CM

## 2019-08-31 MED ORDER — DEXILANT 60 MG PO CPDR: 60.0000 mg | DELAYED_RELEASE_CAPSULE | Freq: Every day | ORAL | 1 refills | Status: DC

## 2019-08-31 NOTE — Patient Instructions (Signed)
INCREASE OMEPRAZOLE TO TWICE DAILY UNTIL WE TALK LATER THIS WEEK  AVOID FOODS THAT TRIGGER YOUR SYMPTOMS   STAY WELL HYDRATED  MAY CONSIDER: GASTRIC EMPTYING STUDY AND GALLBLADDER U/S  PURCHASE SOME GAS-X  Food Choices for Gastroesophageal Reflux Disease, Adult When you have gastroesophageal reflux disease (GERD), the foods you eat and your eating habits are very important. Choosing the right foods can help ease your discomfort. Think about working with a nutrition specialist (dietitian) to help you make good choices. What are tips for following this plan?  Meals  Choose healthy foods that are low in fat, such as fruits, vegetables, whole grains, low-fat dairy products, and lean meat, fish, and poultry.  Eat small meals often instead of 3 large meals a day. Eat your meals slowly, and in a place where you are relaxed. Avoid bending over or lying down until 2-3 hours after eating.  Avoid eating meals 2-3 hours before bed.  Avoid drinking a lot of liquid with meals.  Cook foods using methods other than frying. Bake, grill, or broil food instead.  Avoid or limit: ? Chocolate. ? Peppermint or spearmint. ? Alcohol. ? Pepper. ? Black and decaffeinated coffee. ? Black and decaffeinated tea. ? Bubbly (carbonated) soft drinks. ? Caffeinated energy drinks and soft drinks.  Limit high-fat foods such as: ? Fatty meat or fried foods. ? Whole milk, cream, butter, or ice cream. ? Nuts and nut butters. ? Pastries, donuts, and sweets made with butter or shortening.  Avoid foods that cause symptoms. These foods may be different for everyone. Common foods that cause symptoms include: ? Tomatoes. ? Oranges, lemons, and limes. ? Peppers. ? Spicy food. ? Onions and garlic. ? Vinegar. Lifestyle  Maintain a healthy weight. Ask your doctor what weight is healthy for you. If you need to lose weight, work with your doctor to do so safely.  Exercise for at least 30 minutes for 5 or more days  each week, or as told by your doctor.  Wear loose-fitting clothes.  Do not smoke. If you need help quitting, ask your doctor.  Sleep with the head of your bed higher than your feet. Use a wedge under the mattress or blocks under the bed frame to raise the head of the bed. Summary  When you have gastroesophageal reflux disease (GERD), food and lifestyle choices are very important in easing your symptoms.  Eat small meals often instead of 3 large meals a day. Eat your meals slowly, and in a place where you are relaxed.  Limit high-fat foods such as fatty meat or fried foods.  Avoid bending over or lying down until 2-3 hours after eating.  Avoid peppermint and spearmint, caffeine, alcohol, and chocolate. This information is not intended to replace advice given to you by your health care provider. Make sure you discuss any questions you have with your health care provider. Document Revised: 10/30/2018 Document Reviewed: 08/14/2016 Elsevier Patient Education  2020 [employer].

## 2019-08-31 NOTE — Progress Notes (Signed)
This visit occurred during the SARS-CoV-2 public health emergency.  Safety protocols were in place, including screening questions prior to the visit, additional usage of staff PPE, and extensive cleaning of exam room while observing appropriate contact time as indicated for disinfecting solutions.  Subjective:     Patient ID: Michele Pope , female    DOB: 1950/10/31 , 69 y.o.   MRN: [id number]   Chief Complaint  Patient presents with  . Headache  . Dizziness  . Nausea    HPI  She is here today for further evaluation of persistent left-sided headache - sx started about 2 weeks ago. Sx accompanied by dizziness and nausea. She is not sure what may have triggered her sx. No associated visual disturbances. No h/o migraines. She has not checked BP at home. Also with worsening heartburn. Sx associated with dizziness - esp with positional changes.   Headache  This is a new problem. The current episode started 1 to 4 weeks ago. The problem has been unchanged. The pain is located in the left unilateral region. The pain does not radiate. Associated symptoms include dizziness and nausea.  Dizziness Associated symptoms include headaches and nausea.     Past Medical History:  Diagnosis Date  . Diabetes mellitus without complication ([place])   . High cholesterol   . HTN (hypertension)      Family History  Problem Relation Age of Onset  . Heart attack Mother   . Blindness Father      Current Outpatient Medications:  .  Apoaequorin (PREVAGEN PO), Take by mouth., Disp: , Rfl:  .  Blood Glucose Monitoring Suppl (ONE TOUCH ULTRA 2) w/Device KIT, Use as directed to check blood sugars 1 time per day dx: e11.22, Disp: 1 each, Rfl: 1 .  Cholecalciferol (VITAMIN D3) 125 MCG (5000 UT) CAPS, Take by mouth., Disp: , Rfl:  .  diclofenac sodium (VOLTAREN) 1 % GEL, Apply topically 4 (four) times daily., Disp: , Rfl:  .  ezetimibe (ZETIA) 10 MG tablet, TAKE 1 TABLET BY MOUTH EVERY DAY, Disp: 90 tablet,  Rfl: 1 .  glucose blood (ONE TOUCH ULTRA TEST) test strip, Use as instructed to check blood sugars 1 time per day dx: e11.65, Disp: 100 each, Rfl: 11 .  JANUMET XR 50-1000 MG TB24, TAKE 1 TABLET BY MOUTH TWICE A DAY, Disp: 180 tablet, Rfl: 1 .  levocetirizine (XYZAL) 5 MG tablet, Take 5 mg by mouth every evening., Disp: , Rfl:  .  lisinopril (ZESTRIL) 10 MG tablet, TAKE 1 TABLET BY MOUTH EVERY DAY, Disp: 90 tablet, Rfl: 1 .  loratadine (CLARITIN) 10 MG tablet, TAKE 1 TABLET BY MOUTH EVERY DAY, Disp: 90 tablet, Rfl: 1 .  mometasone (NASONEX) 50 MCG/ACT nasal spray, SPRAY 2 SPRAYS INTO EACH NOSTRIL EVERY DAY, Disp: 17 g, Rfl: 1 .  naproxen (NAPROSYN) 500 MG tablet, Take 500 mg by mouth as needed. , Disp: , Rfl:  .  omeprazole (PRILOSEC) 40 MG capsule, TAKE 1 CAPSULE BY MOUTH EVERY DAY BEFORE A MEAL, Disp: 90 capsule, Rfl: 2 .  VASCEPA 1 g capsule, TAKE 2 CAPSULES BY MOUTH TWICE A DAY WITH FOOD -DO NOT CHEW, OPEN, DISSOLVE OR CRUSH, Disp: 120 capsule, Rfl: 1   Allergies  Allergen Reactions  . Meloxicam Diarrhea  . Pitavastatin      Review of Systems  Constitutional: Negative.   Respiratory: Negative.   Cardiovascular: Negative.   Gastrointestinal: Positive for nausea.       She c/o nausea. Sometimes heartburn.  No vomiting. Unable to determine what triggers her sx. She has been taking omeprazole for reflux. This no longer seems to control her sx.   Neurological: Positive for dizziness and headaches.  Psychiatric/Behavioral: Negative.      Today's Vitals   08/31/19 1436  Temp: 98.1 F (36.7 C)  TempSrc: Oral  Weight: 206 lb 6.4 oz (93.6 kg)  Height: 5' 6.8" (1.697 m)  PainSc: 0-No pain   Body mass index is 32.52 kg/m.   Objective:  Physical Exam Vitals and nursing note reviewed.  Constitutional:      Appearance: Normal appearance.  HENT:     Head: Normocephalic and atraumatic.     Right Ear: Hearing, tympanic membrane, ear canal and external ear normal.     Left Ear:  Hearing, tympanic membrane, ear canal and external ear normal.  Cardiovascular:     Rate and Rhythm: Normal rate and regular rhythm.     Heart sounds: Normal heart sounds.  Pulmonary:     Effort: Pulmonary effort is normal.     Breath sounds: Normal breath sounds.  Skin:    General: Skin is warm.  Neurological:     General: No focal deficit present.     Mental Status: She is alert.  Psychiatric:        Mood and Affect: Mood normal.        Behavior: Behavior normal.         Assessment And Plan:     1. Left temporal headache  She is encouraged to stay well hydrated. She is agreeable to Neuro eval for sleep study. She may also benefit from magnesium supplementation.   - Ambulatory referral to Neurology  2. Dizziness  Orthostatics performed, negative. Again, she is encouraged to stay well hydrated.    - Ambulatory referral to Neurology  3. Gastroesophageal reflux disease without esophagitis  I will start her on Dexilant 35m once daily. She is encouraged to stop eating 3 hours prior to going to bed. She will rto in six weeks for re-evaluation.   4. Class 1 obesity due to excess calories with serious comorbidity and body mass index (BMI) of 32.0 to 32.9 in adult  She is encouraged to strive for BMI less than 29 to decrease cardiac risk. She is encouraged to exercise 30 minutes five days weekly.     RMaximino Greenland MD    THE PATIENT IS ENCOURAGED TO PRACTICE SOCIAL DISTANCING DUE TO THE COVID-19 PANDEMIC.

## 2019-09-10 ENCOUNTER — Other Ambulatory Visit: Payer: Self-pay | Admitting: Internal Medicine

## 2019-09-12 ENCOUNTER — Encounter: Payer: Self-pay | Admitting: Internal Medicine

## 2019-09-15 ENCOUNTER — Encounter: Payer: Self-pay | Admitting: Internal Medicine

## 2019-09-17 ENCOUNTER — Other Ambulatory Visit: Payer: Self-pay | Admitting: Internal Medicine

## 2019-09-28 ENCOUNTER — Other Ambulatory Visit: Payer: Self-pay

## 2019-09-28 ENCOUNTER — Ambulatory Visit (INDEPENDENT_AMBULATORY_CARE_PROVIDER_SITE_OTHER): Payer: Medicare Other | Admitting: Internal Medicine

## 2019-09-28 ENCOUNTER — Encounter: Payer: Self-pay | Admitting: Internal Medicine

## 2019-09-28 VITALS — BP 130/84 | HR 100 | Temp 97.9°F | Ht 66.8 in | Wt 203.2 lb

## 2019-09-28 DIAGNOSIS — Z6832 Body mass index (BMI) 32.0-32.9, adult: Secondary | ICD-10-CM

## 2019-09-28 DIAGNOSIS — I10 Essential (primary) hypertension: Secondary | ICD-10-CM | POA: Diagnosis not present

## 2019-09-28 DIAGNOSIS — D519 Vitamin B12 deficiency anemia, unspecified: Secondary | ICD-10-CM

## 2019-09-28 DIAGNOSIS — K219 Gastro-esophageal reflux disease without esophagitis: Secondary | ICD-10-CM | POA: Diagnosis not present

## 2019-09-28 DIAGNOSIS — E6609 Other obesity due to excess calories: Secondary | ICD-10-CM

## 2019-09-28 DIAGNOSIS — E1165 Type 2 diabetes mellitus with hyperglycemia: Secondary | ICD-10-CM

## 2019-09-28 MED ORDER — CYANOCOBALAMIN 1000 MCG/ML IJ SOLN
1000.0000 ug | Freq: Once | INTRAMUSCULAR | Status: AC
  Administered 2019-09-28: 1000 ug via INTRAMUSCULAR

## 2019-09-28 MED ORDER — DEXILANT 60 MG PO CPDR: 60.0000 mg | DELAYED_RELEASE_CAPSULE | Freq: Every day | ORAL | 1 refills | Status: DC

## 2019-09-28 NOTE — Patient Instructions (Signed)
Diabetes Mellitus and Foot Care Foot care is an important part of your health, especially when you have diabetes. Diabetes may cause you to have problems because of poor blood flow (circulation) to your feet and legs, which can cause your skin to:  Become thinner and drier.  Break more easily.  Heal more slowly.  Peel and crack. You may also have nerve damage (neuropathy) in your legs and feet, causing decreased feeling in them. This means that you may not notice minor injuries to your feet that could lead to more serious problems. Noticing and addressing any potential problems early is the best way to prevent future foot problems. How to care for your feet Foot hygiene  Wash your feet daily with warm water and mild soap. Do not use hot water. Then, pat your feet and the areas between your toes until they are completely dry. Do not soak your feet as this can dry your skin.  Trim your toenails straight across. Do not dig under them or around the cuticle. File the edges of your nails with an emery board or nail file.  Apply a moisturizing lotion or petroleum jelly to the skin on your feet and to dry, brittle toenails. Use lotion that does not contain alcohol and is unscented. Do not apply lotion between your toes. Shoes and socks  Wear clean socks or stockings every day. Make sure they are not too tight. Do not wear knee-high stockings since they may decrease blood flow to your legs.  Wear shoes that fit properly and have enough cushioning. Always look in your shoes before you put them on to be sure there are no objects inside.  To break in new shoes, wear them for just a few hours a day. This prevents injuries on your feet. Wounds, scrapes, corns, and calluses  Check your feet daily for blisters, cuts, bruises, sores, and redness. If you cannot see the bottom of your feet, use a mirror or ask someone for help.  Do not cut corns or calluses or try to remove them with medicine.  If you  find a minor scrape, cut, or break in the skin on your feet, keep it and the skin around it clean and dry. You may clean these areas with mild soap and water. Do not clean the area with peroxide, alcohol, or iodine.  If you have a wound, scrape, corn, or callus on your foot, look at it several times a day to make sure it is healing and not infected. Check for: ? Redness, swelling, or pain. ? Fluid or blood. ? Warmth. ? Pus or a bad smell. General instructions  Do not cross your legs. This may decrease blood flow to your feet.  Do not use heating pads or hot water bottles on your feet. They may burn your skin. If you have lost feeling in your feet or legs, you may not know this is happening until it is too late.  Protect your feet from hot and cold by wearing shoes, such as at the beach or on hot pavement.  Schedule a complete foot exam at least once a year (annually) or more often if you have foot problems. If you have foot problems, report any cuts, sores, or bruises to your health care provider immediately. Contact a health care provider if:  You have a medical condition that increases your risk of infection and you have any cuts, sores, or bruises on your feet.  You have an injury that is not   healing.  You have redness on your legs or feet.  You feel burning or tingling in your legs or feet.  You have pain or cramps in your legs and feet.  Your legs or feet are numb.  Your feet always feel cold.  You have pain around a toenail. Get help right away if:  You have a wound, scrape, corn, or callus on your foot and: ? You have pain, swelling, or redness that gets worse. ? You have fluid or blood coming from the wound, scrape, corn, or callus. ? Your wound, scrape, corn, or callus feels warm to the touch. ? You have pus or a bad smell coming from the wound, scrape, corn, or callus. ? You have a fever. ? You have a red line going up your leg. Summary  Check your feet every day  for cuts, sores, red spots, swelling, and blisters.  Moisturize feet and legs daily.  Wear shoes that fit properly and have enough cushioning.  If you have foot problems, report any cuts, sores, or bruises to your health care provider immediately.  Schedule a complete foot exam at least once a year (annually) or more often if you have foot problems. This information is not intended to replace advice given to you by your health care provider. Make sure you discuss any questions you have with your health care provider. Document Revised: 04/01/2019 Document Reviewed: 08/10/2016 Elsevier Patient Education  2020 Else[employer] Inc[employer]  

## 2019-09-29 LAB — BMP8+EGFR
BUN/Creatinine Ratio: 13 (ref 12–28)
BUN: 12 mg/dL (ref 8–27)
CO2: 23 mmol/L (ref 20–29)
Calcium: 10 mg/dL (ref 8.7–10.3)
Chloride: 106 mmol/L (ref 96–106)
Creatinine, Ser: 0.94 mg/dL (ref 0.57–1.00)
GFR calc Af Amer: 73 mL/min/{1.73_m2} (ref >59)
GFR calc non Af Amer: 63 mL/min/{1.73_m2} (ref >59)
Glucose: 99 mg/dL (ref 65–99)
Potassium: 4.1 mmol/L (ref 3.5–5.2)
Sodium: 142 mmol/L (ref 134–144)

## 2019-09-29 LAB — LIPID PANEL
Chol/HDL Ratio: 3.7 ratio (ref 0.0–4.4)
Cholesterol, Total: 134 mg/dL (ref 100–199)
HDL: 36 mg/dL — ABNORMAL LOW (ref >39)
LDL Chol Calc (NIH): 64 mg/dL (ref 0–99)
Triglycerides: 205 mg/dL — ABNORMAL HIGH (ref 0–149)
VLDL Cholesterol Cal: 34 mg/dL (ref 5–40)

## 2019-09-29 LAB — HEMOGLOBIN A1C
Est. average glucose Bld gHb Est-mCnc: 128 mg/dL
Hgb A1c MFr Bld: 6.1 % — ABNORMAL HIGH (ref 4.8–5.6)

## 2019-09-29 LAB — VITAMIN B12: Vitamin B-12: 248 pg/mL (ref 232–1245)

## 2019-10-01 ENCOUNTER — Ambulatory Visit (INDEPENDENT_AMBULATORY_CARE_PROVIDER_SITE_OTHER): Payer: Medicare Other | Admitting: Neurology

## 2019-10-01 ENCOUNTER — Other Ambulatory Visit: Payer: Self-pay

## 2019-10-01 ENCOUNTER — Encounter: Payer: Self-pay | Admitting: Neurology

## 2019-10-01 VITALS — BP 125/80 | HR 90 | Temp 97.6°F | Ht 67.0 in | Wt 204.0 lb

## 2019-10-01 DIAGNOSIS — R748 Abnormal levels of other serum enzymes: Secondary | ICD-10-CM | POA: Diagnosis not present

## 2019-10-01 DIAGNOSIS — E1165 Type 2 diabetes mellitus with hyperglycemia: Secondary | ICD-10-CM | POA: Diagnosis not present

## 2019-10-01 DIAGNOSIS — D519 Vitamin B12 deficiency anemia, unspecified: Secondary | ICD-10-CM

## 2019-10-01 DIAGNOSIS — R0683 Snoring: Secondary | ICD-10-CM

## 2019-10-01 NOTE — Patient Instructions (Signed)

## 2019-10-01 NOTE — Progress Notes (Signed)
SLEEP MEDICINE CLINIC    Provider:  Larey Seat, MD  Primary Care Physician:  Glendale Chard, [address] [place] [address]     Referring Provider: Glendale Chard, [address] [place] [address],  [place] [address]          Chief Complaint according to patient   Patient presents with:    . New Patient (Initial Visit)     pt states that her PCP sent her here due to Diabeties. she has never had a SS and has no concerns related to her sleep. this is more just to rule out. She had a NCV and EMG Study with dr Leta Baptist, normal.      HISTORY OF PRESENT ILLNESS:  Michele Pope is a 69 year old African American female patient seen on 10/01/2019 for a possible slep epnea evaluation. Her husband stated she snores. .  Chief concern according to patient : " I have no concerns about my sleep"   I have the pleasure of seeing Michele Pope , a right -handed Black or [place] American female with a possible sleep disorder.  She  has a past medical history of Diabetes mellitus without complication ([place]), High cholesterol, and HTN (hypertension). An evaluation by Dr. Leta Baptist for neuropathy was negative in 2018.  The patient doesn't remember the time she must have been in this office or the test (!).      Family medical /sleep history: no family member with OSA. She is the youngest of 61, has two older brothers and one sister. Parents deceased.     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 Family status is married , with 3 sons , all grown and healthy.  Pets are not present.Tobacco use; never. ETOH use ; no,  Caffeine intake in form of Coffee( none) Soda(1 / day) Tea ( when eating out ). No energy drinks. Regular exercise in form of walking.     Sleep habits are as follows: The patient's dinner time is between 5-6 PM. The patient goes to bed at 11-12 PM and watches TV in  bed- about 30 minutes, timer set.  She continues to sleep for 3-4 hours, wakes for one  bathroom breaks. She avoids looking at the clock. .   The preferred sleep position is laterally, with the support of 2 pillows.  Dreams reportedly 2-3 times a week .  The patient wakes up spontaneously at 7 am.  Her grandson stays with her and she takes him to school.  She reports most morning feeling refreshed / restored in AM, with symptoms such as dry mouth, morning sinus  headaches. Naps are taken infrequently, after lunch, lasting from 5-12 minutes and are more refreshing than nocturnal sleep.    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   How likely are you to doze in the following situations: 0 = not likely, 1 = slight chance, 2 = moderate chance, 3 = high chance   Sitting and Reading? Watching Television? Sitting inactive in a public place (theater or meeting)? As a passenger in a car for an hour without a break?  Lying down in the afternoon when circumstances permit? Sitting and talking to someone? Sitting quietly after lunch without alcohol? In a car, while stopped for a few minutes in traffic?   Total = 7/ 24 points   FSS endorsed at 13/ 63 points.   Social History   Socioeconomic History  . Marital status: Married    Spouse name: Not on file  . Number of children: Not on file  . Years of education: Not on file  . Highest education level: Not on file  Occupational History  . Not on file  Tobacco Use  . Smoking status: Never Smoker  . Smokeless tobacco: Never Used  Substance and Sexual Activity  . Alcohol use: Never  . Drug use: Never  . Sexual activity: Not on file  Other Topics Concern  . Not on file  Social History Narrative   . Not on file   Social Determinants of Health   Financial Resource Strain:   . Difficulty of Paying Living Expenses:   Food Insecurity:   . Worried About Charity fundraiser in the Last Year:   . Arboriculturist in the Last Year:   Transportation Needs:   . Film/video editor (Medical):   Marland Kitchen Lack of Transportation (Non-Medical):   Physical Activity:   . Days of Exercise per Week:   . Minutes of Exercise per Session:   Stress:   . Feeling of Stress :   Social Connections:   . Frequency of Communication with Friends and Family:   . Frequency of Social Gatherings with Friends and Family:   . Attends Religious Services:   . Active Member of Clubs or Organizations:   . Attends Archivist Meetings:   Marland Kitchen Marital Status:     Family History  Problem Relation Age of Onset  . Heart attack Mother   . Blindness Father     Past Medical History:  Diagnosis Date  . Diabetes mellitus without complication ([place])   . High cholesterol   . HTN (hypertension)     Past Surgical History:  Procedure Laterality Date  . BREAST EXCISIONAL BIOPSY Right   . SHOULDER ARTHROSCOPY W/ ROTATOR CUFF REPAIR  08/22/2018  . TOTAL ABDOMINAL HYSTERECTOMY  1995     Current Outpatient Medications on File Prior to Visit  Medication Sig Dispense Refill  . Apoaequorin (PREVAGEN PO) Take by mouth 2 (two) times a week.     . Blood Glucose Monitoring Suppl (ONE TOUCH ULTRA 2) w/Device KIT Use as directed to check blood sugars 1 time per day dx: e11.22 1 each 1  . Cholecalciferol (VITAMIN D3) 125 MCG (5000 UT) CAPS Take by mouth.    . dexlansoprazole (DEXILANT) 60 MG capsule Take 1 capsule (60 mg total) by mouth daily. 90 capsule 1  . diclofenac sodium (VOLTAREN) 1 % GEL Apply topically 4 (four) times daily.    Marland Kitchen ezetimibe (ZETIA) 10 MG tablet TAKE 1 TABLET BY MOUTH EVERY DAY 90 tablet 1  . glucose blood (ONE TOUCH ULTRA TEST) test strip Use as instructed to check blood sugars 1 time per day dx:  e11.65 100 each 11  . JANUMET XR 50-1000 MG TB24 TAKE 1 TABLET BY MOUTH TWICE A DAY 180 tablet 1  . levocetirizine (XYZAL) 5 MG tablet Take 5 mg by mouth every evening.    Marland Kitchen lisinopril (ZESTRIL) 10 MG tablet TAKE 1 TABLET BY MOUTH EVERY DAY 90 tablet 1  . loratadine (CLARITIN) 10 MG tablet TAKE  1 TABLET BY MOUTH EVERY DAY 90 tablet 1  . mometasone (NASONEX) 50 MCG/ACT nasal spray SPRAY 2 SPRAYS INTO EACH NOSTRIL EVERY DAY 17 g 1  . naproxen (NAPROSYN) 500 MG tablet Take 500 mg by mouth as needed.     Marland Kitchen VASCEPA 1 g capsule TAKE 2 CAPSULES BY MOUTH TWICE A DAY WITH FOOD *DO NOT CRUSH,OPEN,DISSOLVE OR CHEW 120 capsule 1   No current facility-administered medications on file prior to visit.    Allergies  Allergen Reactions  . Meloxicam Diarrhea  . Pitavastatin     Physical exam:  Today's Vitals   10/01/19 1258  BP: 125/80  Pulse: 90  Temp: 97.6 F (36.4 C)  Weight: 204 lb (92.5 kg)  Height: _0  (1.702 m)   Body mass index is 31.95 kg/m.   Wt Readings from Last 3 Encounters:  10/01/19 204 lb (92.5 kg)  09/28/19 203 lb 3.2 oz (92.2 kg)  08/31/19 206 lb 6.4 oz (93.6 kg)     Ht Readings from Last 3 Encounters:  10/01/19 _1  (1.702 m)  09/28/19 5' 6.8" (1.697 m)  08/31/19 5' 6.8" (1.697 m)      General: The patient is awake, alert and appears not in acute distress. The patient is well groomed. Head: Normocephalic, atraumatic. Neck is supple. Mallampati:2 ,  neck circumference:16.5 inches. Nasal airflow patent.  Retrognathia is seen.  Dental status: intact  Cardiovascular:  Regular rate and cardiac rhythm by pulse,  without distended neck veins. Respiratory: Lungs are clear to auscultation.  Skin:  Without evidence of ankle edema, or rash. Trunk: The patient's posture is erect.   Neurologic exam : The patient is awake and alert, oriented to place and time.   Memory subjective described as intact.  Attention span & concentration ability appears normal.  Speech is  fluent,  without  dysarthria, dysphonia or aphasia.  Mood and affect are appropriate.   Cranial nerves: no loss of smell or taste reported  Pupils are equal and briskly reactive to light. Funduscopic exam deferred.   Extraocular movements in vertical and horizontal planes were intact and without nystagmus. No Diplopia. Visual fields by finger perimetry are intact. Hearing was reduced in the left to soft voice and finger rubbing.  Facial sensation intact to fine touch. Facial motor strength is symmetric and tongue and uvula move midline.  Neck ROM : rotation, tilt and flexion extension were normal for age and shoulder shrug was symmetrical.    Motor exam:  Symmetric bulk, tone and ROM.   Normal tone without cog- wheeling, symmetric grip strength .  Sensory:  Fine touch, pinprick and vibration were tested  and  normal.  Proprioception tested in the upper extremities was normal. Coordination: Rapid alternating movements in the fingers/hands were of normal speed.  The Finger-to-nose maneuver was intact without evidence of ataxia, dysmetria or tremor. Gait and station: Patient could rise unassisted from a seated position, walked without assistive device.  Stance is of normal width/ base and the patient turned with 2-3 steps.  Toe and heel walk were deferred.  Deep tendon reflexes: in the  upper and lower extremities are symmetric and intact.  Babinski response was deferred .    At the pleasure of seeing Michele Pope. [place] who reports that she is considered prediabetic, has no complaints of fatigue or sleepiness at least not excessively so and only recently developed some left-sided headaches that seem to be arise from the ethmoidal sinus.  She said these have resolved  with the use of a nasal spray that her primary care physician prescribed.  She is no longer concerned about headaches nausea or dizziness.  All the seem to have been sinus related she has continued to take omeprazole for reflux, she is  on  medication for diabetes ( [place]) , and HbAic was 6.1 . She has high triglycerides.  She has no sleep related concern, only her husband report of snoring.      After spending a total time of  35  minutes face to face and additional time for physical and neurologic examination, review of laboratory studies,  personal review of imaging studies, reports and results of other testing and review of referral information / records as far as provided in visit, I have established the following assessments:  1) The patient has been snoring and recently tipped the HBa1c towards true diabetes. Has no HTN. Has high triglycerides.    My Plan is to proceed with:  1) HST to rule out apnea.    I would like to thank Dr Baird Cancer  for allowing me to meet with and to take care of this pleasant patient.  I plan to follow up either personally or through our NP within 2-3 month, only if the HST is suggesting OSA. Marland Kitchen     Electronically signed by: Larey Seat, MD 10/01/2019 1:10 PM  [facility] certified by The [employer] of Sleep Medicine and Diplomate of the [employer] of Sleep Medicine. Board certified In Neurology through the [place], Fellow of the [employer] of Neurology. Medical Director of [employer].

## 2019-10-03 ENCOUNTER — Other Ambulatory Visit: Payer: Self-pay | Admitting: Internal Medicine

## 2019-10-19 ENCOUNTER — Ambulatory Visit: Payer: Medicare Other | Admitting: Neurology

## 2019-10-19 DIAGNOSIS — R748 Abnormal levels of other serum enzymes: Secondary | ICD-10-CM

## 2019-10-19 DIAGNOSIS — E1165 Type 2 diabetes mellitus with hyperglycemia: Secondary | ICD-10-CM

## 2019-10-19 DIAGNOSIS — D519 Vitamin B12 deficiency anemia, unspecified: Secondary | ICD-10-CM

## 2019-10-19 DIAGNOSIS — R0683 Snoring: Secondary | ICD-10-CM

## 2019-10-19 NOTE — Progress Notes (Signed)
This visit occurred during the SARS-CoV-2 public health emergency.  Safety protocols were in place, including screening questions prior to the visit, additional usage of staff PPE, and extensive cleaning of exam room while observing appropriate contact time as indicated for disinfecting solutions.  Subjective:     Patient ID: Michele Pope , female    DOB: 03/17/1951 , 69 y.o.   MRN: [id number]   Chief Complaint  Patient presents with  . Diabetes  . Hypertension    HPI  Diabetes She presents for her follow-up diabetic visit. She has type 2 diabetes mellitus. There are no hypoglycemic associated symptoms. Pertinent negatives for diabetes include no blurred vision and no chest pain. There are no hypoglycemic complications. Risk factors for coronary artery disease include diabetes mellitus, dyslipidemia, hypertension and post-menopausal.  Hypertension This is a chronic problem. The current episode started more than 1 year ago. The problem has been gradually improving since onset. The problem is controlled. Pertinent negatives include no blurred vision, chest pain, palpitations or shortness of breath.     Past Medical History:  Diagnosis Date  . Diabetes mellitus without complication ([place])   . High cholesterol   . HTN (hypertension)      Family History  Problem Relation Age of Onset  . Heart attack Mother   . Blindness Father      Current Outpatient Medications:  .  Apoaequorin (PREVAGEN PO), Take by mouth 2 (two) times a week. , Disp: , Rfl:  .  Blood Glucose Monitoring Suppl (ONE TOUCH ULTRA 2) w/Device KIT, Use as directed to check blood sugars 1 time per day dx: e11.22, Disp: 1 each, Rfl: 1 .  Cholecalciferol (VITAMIN D3) 125 MCG (5000 UT) CAPS, Take by mouth., Disp: , Rfl:  .  dexlansoprazole (DEXILANT) 60 MG capsule, Take 1 capsule (60 mg total) by mouth daily., Disp: 90 capsule, Rfl: 1 .  diclofenac sodium (VOLTAREN) 1 % GEL, Apply topically 4 (four) times daily., Disp: ,  Rfl:  .  ezetimibe (ZETIA) 10 MG tablet, TAKE 1 TABLET BY MOUTH EVERY DAY, Disp: 90 tablet, Rfl: 1 .  glucose blood (ONE TOUCH ULTRA TEST) test strip, Use as instructed to check blood sugars 1 time per day dx: e11.65, Disp: 100 each, Rfl: 11 .  JANUMET XR 50-1000 MG TB24, TAKE 1 TABLET BY MOUTH TWICE A DAY, Disp: 180 tablet, Rfl: 1 .  levocetirizine (XYZAL) 5 MG tablet, Take 5 mg by mouth every evening., Disp: , Rfl:  .  lisinopril (ZESTRIL) 10 MG tablet, TAKE 1 TABLET BY MOUTH EVERY DAY, Disp: 90 tablet, Rfl: 1 .  loratadine (CLARITIN) 10 MG tablet, TAKE 1 TABLET BY MOUTH EVERY DAY, Disp: 90 tablet, Rfl: 1 .  naproxen (NAPROSYN) 500 MG tablet, Take 500 mg by mouth as needed. , Disp: , Rfl:  .  VASCEPA 1 g capsule, TAKE 2 CAPSULES BY MOUTH TWICE A DAY WITH FOOD *DO NOT CRUSH,OPEN,DISSOLVE OR CHEW, Disp: 120 capsule, Rfl: 1 .  mometasone (NASONEX) 50 MCG/ACT nasal spray, SPRAY 2 SPRAYS INTO EACH NOSTRIL EVERY DAY, Disp: 17 g, Rfl: 1   Allergies  Allergen Reactions  . Meloxicam Diarrhea  . Pitavastatin      Review of Systems  Constitutional: Negative.   Eyes: Negative for blurred vision.  Respiratory: Negative.  Negative for shortness of breath.   Cardiovascular: Negative.  Negative for chest pain and palpitations.  Gastrointestinal: Negative.        Still w/ reflux. Sx better with Dexilant, sx return  if she skips a day  Neurological: Negative.   Psychiatric/Behavioral: Negative.  Agitation:      Today's Vitals   09/28/19 1102  BP: 130/84  Pulse: 100  Temp: 97.9 F (36.6 C)  TempSrc: Oral  Weight: 203 lb 3.2 oz (92.2 kg)  Height: 5' 6.8" (1.697 m)  PainSc: 0-No pain   Body mass index is 32.02 kg/m.   Objective:  Physical Exam Vitals and nursing note reviewed.  Constitutional:      Appearance: Normal appearance.  HENT:     Head: Normocephalic and atraumatic.  Cardiovascular:     Rate and Rhythm: Normal rate and regular rhythm.     Heart sounds: Normal heart sounds.   Pulmonary:     Effort: Pulmonary effort is normal.     Breath sounds: Normal breath sounds.  Skin:    General: Skin is warm.  Neurological:     General: No focal deficit present.     Mental Status: She is alert.  Psychiatric:        Mood and Affect: Mood normal.        Behavior: Behavior normal.         Assessment And Plan:     1. Uncontrolled type 2 diabetes mellitus with hyperglycemia (HCC)  Chronic. I will check labs as listed below. Importance of regular exercise was discussed with the patient.   - Lipid panel - BMP8+EGFR - Hemoglobin A1c  2. Essential hypertension, benign  Chronic, controlled. She will continue with current meds. She is encouraged to avoid adding salt to her foods.   3. Gastroesophageal reflux disease without esophagitis  Chronic, sx not yet controlled. She will continue with Dexilant for now. I will try to wean her off at her next visit in August 2021.   4. Anemia due to vitamin B12 deficiency, unspecified B12 deficiency type  I will check vitamin B12 level, and she was given vitamin B12 IM x 1.   - Vitamin B12 - cyanocobalamin ((VITAMIN B-12)) injection 1,000 mcg  5. Class 1 obesity due to excess calories with serious comorbidity and body mass index (BMI) of 32.0 to 32.9 in adult  She is encouraged to strive for BMI less than 29 to decrease cardiac risk. She is encouraged to walk 30 minutes five days weekly.   Michele Greenland, MD    THE PATIENT IS ENCOURAGED TO PRACTICE SOCIAL DISTANCING DUE TO THE COVID-19 PANDEMIC.

## 2019-10-20 ENCOUNTER — Encounter: Payer: Self-pay | Admitting: Internal Medicine

## 2019-10-27 ENCOUNTER — Encounter: Payer: Self-pay | Admitting: Internal Medicine

## 2019-10-27 ENCOUNTER — Other Ambulatory Visit: Payer: Self-pay | Admitting: Internal Medicine

## 2019-10-29 ENCOUNTER — Other Ambulatory Visit: Payer: Self-pay | Admitting: Neurology

## 2019-10-29 ENCOUNTER — Telehealth: Payer: Self-pay | Admitting: Neurology

## 2019-10-29 DIAGNOSIS — R5383 Other fatigue: Secondary | ICD-10-CM

## 2019-10-29 DIAGNOSIS — I10 Essential (primary) hypertension: Secondary | ICD-10-CM

## 2019-10-29 DIAGNOSIS — E1165 Type 2 diabetes mellitus with hyperglycemia: Secondary | ICD-10-CM

## 2019-10-29 DIAGNOSIS — R0683 Snoring: Secondary | ICD-10-CM

## 2019-10-29 DIAGNOSIS — D519 Vitamin B12 deficiency anemia, unspecified: Secondary | ICD-10-CM

## 2019-10-29 NOTE — Progress Notes (Signed)
Patient Information     First Name: Michele Last Name: Pope Cisar: [id number]  Birth Date: 03/19/1951 Age: 69 Gender: Female  Referring Provider: Dorothyann Peng, MD BMI: 32.2 (W=205 lb, H=5' 7'')  Neck Circ.:  17 '' Epworth:  7/24   Sleep Study Information    Study Date: Oct 19, 2019 S/H/A Version: [id number] / [id number] / 68  History:    Khalia Gong is a right -handed African American female with a possible sleep disorder.  She has a past medical history of Diabetes mellitus without complication (HCC), High Triglycerides, and HTN (hypertension). An evaluation by Dr. Marjory Lies for neuropathy was negative. The patient doesn't remember the time she must have been in this office or the test. She has endorsed sinus headaches and is neither excessively sleepy nor fatigued.  Rule out OSA. Snoring endorsed.         Summary & Diagnosis:    AHI was low at 8.3/h indicating very mild apnea, and mild to moderate snoring.  AHI was lowest while sleeping on her left side, and highest in supine sleep position.   Recommendations:     We should first work on sleep positional correction of apnea in this very mild case. CPAP is optional.   Interpreting Physician: Jacklynn Ganong, MD             Sleep Summary    Oxygen Saturation Statistics     Start Study Time: End Study Time: Total Recording Time:          11:26:46 PM 6:43:02 AM   7 h, 16 min  Total Sleep Time % REM of Sleep Time:  5 h, 42 min  25.3    Mean: 95 Minimum: 88 Maximum: 100  Mean of Desaturations Nadirs (%):   94  Oxygen Desaturation. %: 4-9 10-20 >20 Total  Events Number Total  18 100.0  0 0.0  0 0.0  18 100.0  Oxygen Saturation: <90 <=88 <85 <80 <70  Duration (minutes): Sleep % 0.0 0.0 0.0 0.0 0.0 0.0 0.0 0.0 0.0 0.0     Respiratory Indices      Total Events REM NREM All Night  pRDI:  56  pAHI:  47 ODI:  18  pAHIc:  2  % CSR: 0.0 14.1 12.7 4.2 0.7 8.5 6.8 2.8 0.2 9.9 8.3 3.2 0.4         Pulse Rate Statistics during Sleep (BPM)      Mean: 83 Minimum: 46 Maximum: 102    Indices are calculated using technically valid sleep time of 5 h, 40 min. pRDI/pAHI are calculated using oxi desaturations ? 3%  Body Position Statistics  Position Supine Prone Right Left Non-Supine  Sleep (min) 114.5 8.0 120.0 99.8 227.8  Sleep % 33.5 2.3 35.1 29.1 66.5  pRDI 14.2 N/A 12.7 2.4 7.7  pAHI 12.1 N/A 10.1 2.4 6.4  ODI 5.3 N/A 4.1 0.0 2.1     Snoring Statistics Snoring Level (dB) >40 >50 >60 >70 >80 >Threshold (45)  Sleep (min) 59.6 16.6 2.3 0.0 0.0 28.2  Sleep % 17.4 4.8 0.7 0.0 0.0 8.2    Mean: 41 dB Sleep Stages Chart             pAHI=8.3                         Mild              Moderate  Severe                                                 5              15                    30

## 2019-10-29 NOTE — Telephone Encounter (Signed)
Called the patient and reviewed her sleep study with her. Advised the study indicated that mild sleep apnea was present. It was about 8.3 times an hr that she would either stop breathing or have a shallow breath. Advised that when she was on her left side the AHI was better and on her back is when it was the highest. Advised that Dr Dohmeier's recommendation would be to try working on changing sleep positions to help with keeping the apnea at minimal range. Advised CPAP would be optional for the patient if she feels that after trying this she is suffering with daytime sleepiness

## 2019-11-11 ENCOUNTER — Other Ambulatory Visit: Payer: Self-pay | Admitting: Internal Medicine

## 2019-11-14 ENCOUNTER — Other Ambulatory Visit: Payer: Self-pay | Admitting: Internal Medicine

## 2019-11-16 LAB — HM MAMMOGRAPHY: HM Mammogram: NORMAL (ref 0–4)

## 2019-11-18 LAB — HM DIABETES EYE EXAM

## 2019-12-01 ENCOUNTER — Other Ambulatory Visit: Payer: Self-pay | Admitting: Internal Medicine

## 2019-12-25 ENCOUNTER — Other Ambulatory Visit: Payer: Self-pay | Admitting: Internal Medicine

## 2020-01-07 ENCOUNTER — Other Ambulatory Visit: Payer: Self-pay | Admitting: Internal Medicine

## 2020-01-13 ENCOUNTER — Other Ambulatory Visit: Payer: Self-pay | Admitting: Internal Medicine

## 2020-01-18 ENCOUNTER — Other Ambulatory Visit: Payer: Self-pay | Admitting: Internal Medicine

## 2020-01-26 ENCOUNTER — Other Ambulatory Visit: Payer: Self-pay | Admitting: Internal Medicine

## 2020-02-23 ENCOUNTER — Other Ambulatory Visit: Payer: Self-pay | Admitting: Internal Medicine

## 2020-02-24 ENCOUNTER — Other Ambulatory Visit: Payer: Self-pay

## 2020-02-24 ENCOUNTER — Encounter: Payer: Self-pay | Admitting: Internal Medicine

## 2020-02-24 ENCOUNTER — Ambulatory Visit (INDEPENDENT_AMBULATORY_CARE_PROVIDER_SITE_OTHER): Payer: Medicare Other

## 2020-02-24 ENCOUNTER — Ambulatory Visit (INDEPENDENT_AMBULATORY_CARE_PROVIDER_SITE_OTHER): Payer: Medicare Other | Admitting: Internal Medicine

## 2020-02-24 VITALS — BP 130/74 | HR 82 | Temp 97.9°F | Ht 66.0 in | Wt 205.6 lb

## 2020-02-24 DIAGNOSIS — Z0000 Encounter for general adult medical examination without abnormal findings: Secondary | ICD-10-CM | POA: Diagnosis not present

## 2020-02-24 DIAGNOSIS — Z79899 Other long term (current) drug therapy: Secondary | ICD-10-CM

## 2020-02-24 DIAGNOSIS — D519 Vitamin B12 deficiency anemia, unspecified: Secondary | ICD-10-CM | POA: Diagnosis not present

## 2020-02-24 DIAGNOSIS — E6609 Other obesity due to excess calories: Secondary | ICD-10-CM

## 2020-02-24 DIAGNOSIS — E1165 Type 2 diabetes mellitus with hyperglycemia: Secondary | ICD-10-CM

## 2020-02-24 DIAGNOSIS — I10 Essential (primary) hypertension: Secondary | ICD-10-CM

## 2020-02-24 DIAGNOSIS — W11XXXA Fall on and from ladder, initial encounter: Secondary | ICD-10-CM

## 2020-02-24 DIAGNOSIS — E559 Vitamin D deficiency, unspecified: Secondary | ICD-10-CM

## 2020-02-24 DIAGNOSIS — Z23 Encounter for immunization: Secondary | ICD-10-CM | POA: Diagnosis not present

## 2020-02-24 DIAGNOSIS — Z6833 Body mass index (BMI) 33.0-33.9, adult: Secondary | ICD-10-CM

## 2020-02-24 LAB — POCT URINALYSIS DIPSTICK
Bilirubin, UA: NEGATIVE
Blood, UA: NEGATIVE
Glucose, UA: NEGATIVE
Ketones, UA: NEGATIVE
Nitrite, UA: NEGATIVE
Protein, UA: NEGATIVE
Spec Grav, UA: 1.01 (ref 1.010–1.025)
Urobilinogen, UA: 0.2 E.U./dL
pH, UA: 7 (ref 5.0–8.0)

## 2020-02-24 LAB — POCT UA - MICROALBUMIN
Albumin/Creatinine Ratio, Urine, POC: <30
Creatinine, POC: 50 mg/dL
Microalbumin Ur, POC: 10 mg/L

## 2020-02-24 MED ORDER — CYANOCOBALAMIN 1000 MCG/ML IJ SOLN
1000.0000 ug | Freq: Once | INTRAMUSCULAR | Status: AC
  Administered 2020-02-24: 1000 ug via INTRAMUSCULAR

## 2020-02-24 MED ORDER — PREVNAR 13 IM SUSP: 0.5000 mL | INTRAMUSCULAR | 0 refills | Status: AC

## 2020-02-24 MED ORDER — ZOSTER VAC RECOMB ADJUVANTED 50 MCG/0.5ML IM SUSR: 0.5000 mL | Freq: Once | INTRAMUSCULAR | 0 refills | Status: AC

## 2020-02-24 NOTE — Progress Notes (Signed)
I,Michele Pope,acting as a Education administrator for Michele Greenland, MD.,have documented all relevant documentation on the behalf of Michele Greenland, MD,as directed by  Michele Greenland, MD while in the presence of Michele Greenland, MD.  This visit occurred during the SARS-CoV-2 public health emergency.  Safety protocols were in place, including screening questions prior to the visit, additional usage of staff PPE, and extensive cleaning of exam room while observing appropriate contact time as indicated for disinfecting solutions.  Subjective:     Patient ID: Michele Pope , female    DOB: 1950/09/03 , 69 y.o.   MRN: [id number]   Chief Complaint  Patient presents with  . Annual Exam  . Diabetes  . Hypertension    HPI  The patient is here today for a physical examination.  She has no specific concerns or complaints at this time.   Diabetes She presents for her follow-up diabetic visit. She has type 2 diabetes mellitus. There are no hypoglycemic associated symptoms. Pertinent negatives for diabetes include no blurred vision and no chest pain. There are no hypoglycemic complications. Risk factors for coronary artery disease include diabetes mellitus, dyslipidemia, hypertension and post-menopausal. She is following a diabetic diet. She participates in exercise intermittently. There is no change in her home blood glucose trend. An ACE inhibitor/angiotensin II receptor blocker is being taken.  Hypertension This is a chronic problem. The current episode started more than 1 year ago. The problem has been gradually improving since onset. The problem is controlled. Pertinent negatives include no blurred vision, chest pain, palpitations or shortness of breath. The current treatment provides moderate improvement.     Past Medical History:  Diagnosis Date  . Diabetes mellitus without complication ([place])   . High cholesterol   . HTN (hypertension)      Family History  Problem Relation Age of Onset  . Heart  attack Mother   . Blindness Father      Current Outpatient Medications:  .  Apoaequorin (PREVAGEN PO), Take by mouth 2 (two) times a week. , Disp: , Rfl:  .  Blood Glucose Monitoring Suppl (ONE TOUCH ULTRA 2) w/Device KIT, Use as directed to check blood sugars 1 time per day dx: e11.22, Disp: 1 each, Rfl: 1 .  Cholecalciferol (VITAMIN D3) 125 MCG (5000 UT) CAPS, Take by mouth., Disp: , Rfl:  .  DEXILANT 60 MG capsule, TAKE 1 CAPSULE BY MOUTH EVERY DAY, Disp: 30 capsule, Rfl: 1 .  diclofenac sodium (VOLTAREN) 1 % GEL, Apply topically 4 (four) times daily., Disp: , Rfl:  .  glucose blood (ONE TOUCH ULTRA TEST) test strip, Use as instructed to check blood sugars 1 time per day dx: e11.65, Disp: 100 each, Rfl: 11 .  JANUMET XR 50-1000 MG TB24, TAKE 1 TABLET BY MOUTH TWICE A DAY, Disp: 180 tablet, Rfl: 1 .  levocetirizine (XYZAL) 5 MG tablet, Take 5 mg by mouth every evening., Disp: , Rfl:  .  lisinopril (ZESTRIL) 10 MG tablet, TAKE 1 TABLET BY MOUTH EVERY DAY, Disp: 90 tablet, Rfl: 1 .  loratadine (CLARITIN) 10 MG tablet, TAKE 1 TABLET BY MOUTH EVERY DAY, Disp: 90 tablet, Rfl: 1 .  naproxen (NAPROSYN) 500 MG tablet, Take 500 mg by mouth as needed. , Disp: , Rfl:  .  ezetimibe (ZETIA) 10 MG tablet, TAKE 1 TABLET BY MOUTH EVERY DAY, Disp: 90 tablet, Rfl: 1 .  icosapent Ethyl (VASCEPA) 1 g capsule, Take 1 capsule (1 g total) by mouth daily. 1 capsule  daily, Disp: 90 capsule, Rfl: 1 .  mometasone (NASONEX) 50 MCG/ACT nasal spray, SPRAY 2 SPRAYS INTO EACH NOSTRIL EVERY DAY, Disp: 17 g, Rfl: 1   Allergies  Allergen Reactions  . Meloxicam Diarrhea  . Pitavastatin       The patient states she uses post menopausal status for birth control. Last LMP was No LMP recorded. Patient is postmenopausal.. Negative for Dysmenorrhea. Negative for: breast discharge, breast lump(s), breast pain and breast self exam. Associated symptoms include abnormal vaginal bleeding. Pertinent negatives include abnormal  bleeding (hematology), anxiety, decreased libido, depression, difficulty falling sleep, dyspareunia, history of infertility, nocturia, sexual dysfunction, sleep disturbances, urinary incontinence, urinary urgency, vaginal discharge and vaginal itching. Diet regular.The patient states her exercise level is  INTERMITTENT.   . The patient's tobacco use is:  Social History   Tobacco Use  Smoking Status Never Smoker  Smokeless Tobacco Never Used  . She has been exposed to passive smoke. The patient's alcohol use is:  Social History   Substance and Sexual Activity  Alcohol Use Never    Review of Systems  Constitutional: Negative.   HENT: Negative.   Eyes: Negative.  Negative for blurred vision.  Respiratory: Negative.  Negative for shortness of breath.   Cardiovascular: Negative.  Negative for chest pain and palpitations.  Gastrointestinal: Negative.   Endocrine: Negative.   Genitourinary: Negative.   Musculoskeletal: Negative.        She c/o flank pain. States she fell off of a stepladder on 8/2 while helping her friend hang curtains. She did not fall to the ground. She did not seek medical attention.   Skin: Negative.   Allergic/Immunologic: Negative.   Neurological: Negative.   Hematological: Negative.   Psychiatric/Behavioral: Negative.      Today's Vitals   02/24/20 1054  BP: 130/74  Pulse: 82  Temp: 97.9 F (36.6 C)  TempSrc: Oral  Weight: 205 lb 9.6 oz (93.3 kg)  Height: 5' 6"  (1.676 m)  PainSc: 0-No pain   Body mass index is 33.18 kg/m.  Wt Readings from Last 3 Encounters:  02/24/20 205 lb 9.6 oz (93.3 kg)  02/24/20 205 lb 9.6 oz (93.3 kg)  10/01/19 204 lb (92.5 kg)   BP Readings from Last 3 Encounters:  02/24/20 130/74  02/24/20 130/74  10/01/19 125/80    Objective:  Physical Exam Constitutional:      General: She is not in acute distress.    Appearance: Normal appearance. She is well-developed. She is obese.  HENT:     Head: Normocephalic and  atraumatic.     Right Ear: Hearing, tympanic membrane, ear canal and external ear normal. There is no impacted cerumen.     Left Ear: Hearing, tympanic membrane, ear canal and external ear normal. There is no impacted cerumen.     Nose: Nose normal.     Mouth/Throat:     Mouth: Mucous membranes are dry.  Eyes:     General: Lids are normal.     Extraocular Movements: Extraocular movements intact.     Conjunctiva/sclera: Conjunctivae normal.     Pupils: Pupils are equal, round, and reactive to light.     Funduscopic exam:    Right eye: No papilledema.        Left eye: No papilledema.  Neck:     Thyroid: No thyroid mass.     Vascular: No carotid bruit.  Cardiovascular:     Rate and Rhythm: Normal rate and regular rhythm.     Pulses: Normal  pulses.          Dorsalis pedis pulses are 2+ on the right side and 2+ on the left side.     Heart sounds: Normal heart sounds. No murmur heard.   Pulmonary:     Effort: Pulmonary effort is normal.     Breath sounds: Normal breath sounds.  Chest:     Breasts: Tanner Score is 5.        Right: Normal.        Left: Normal.  Abdominal:     General: Abdomen is flat. Bowel sounds are normal. There is no distension.     Palpations: Abdomen is soft.     Tenderness: There is no abdominal tenderness.  Genitourinary:    Rectum: Guaiac result negative.  Musculoskeletal:        General: No swelling. Normal range of motion.     Cervical back: Full passive range of motion without pain, normal range of motion and neck supple.     Right lower leg: No edema.     Left lower leg: No edema.  Feet:     Right foot:     Protective Sensation: 5 sites tested. 5 sites sensed.     Skin integrity: Callus and dry skin present.     Toenail Condition: Right toenails are normal.     Left foot:     Protective Sensation: 5 sites tested. 5 sites sensed.     Skin integrity: Callus and dry skin present.     Toenail Condition: Left toenails are normal.  Skin:    General:  Skin is warm and dry.     Capillary Refill: Capillary refill takes less than 2 seconds.  Neurological:     General: No focal deficit present.     Mental Status: She is alert and oriented to person, place, and time.     Cranial Nerves: No cranial nerve deficit.     Sensory: No sensory deficit.  Psychiatric:        Mood and Affect: Mood normal.        Behavior: Behavior normal.        Thought Content: Thought content normal.        Judgment: Judgment normal.         Assessment And Plan:     1. Encounter for general adult medical examination without abnormal findings Comments: A full exam was performed. Importance of monthly seff breast exams was discussed with the patient. PATIENT IS ADVISED TO GET 30-45 MINUTES REGULAR EXERCISE NO LESS THAN FOUR TO FIVE DAYS PER WEEK - BOTH WEIGHTBEARING EXERCISES AND AEROBIC ARE RECOMMENDED.  PATIENT IS ADVISED TO FOLLOW A HEALTHY DIET WITH AT LEAST SIX FRUITS/VEGGIES PER DAY, DECREASE INTAKE OF RED MEAT, AND TO INCREASE FISH INTAKE TO TWO DAYS PER WEEK.  MEATS/FISH SHOULD NOT BE FRIED, BAKED OR BROILED IS PREFERABLE.  I SUGGEST WEARING SPF 50 SUNSCREEN ON EXPOSED PARTS AND ESPECIALLY WHEN IN THE DIRECT SUNLIGHT FOR AN EXTENDED PERIOD OF TIME.  PLEASE AVOID FAST FOOD RESTAURANTS AND INCREASE YOUR WATER INTAKE.  2. Uncontrolled type 2 diabetes mellitus with hyperglycemia ([place]) Comments: Diabetic foot exam was performed. I DISCUSSED WITH THE PATIENT AT LENGTH REGARDING THE GOALS OF GLYCEMIC CONTROL AND POSSIBLE LONG-TERM COMPLICATIONS.  I  ALSO STRESSED THE IMPORTANCE OF COMPLIANCE WITH HOME GLUCOSE MONITORING, DIETARY RESTRICTIONS INCLUDING AVOIDANCE OF SUGARY DRINKS/PROCESSED FOODS,  ALONG WITH REGULAR EXERCISE.  I  ALSO STRESSED THE IMPORTANCE OF ANNUAL EYE EXAMS, SELF FOOT CARE AND  COMPLIANCE WITH OFFICE VISITS.  - Hemoglobin A1c - CBC - CMP14+EGFR  3. Essential hypertension, benign Comments: Chronic, controlled. She will continue with current meds.  She is encouraged to avoid adding salt to her foodsl EKG performed, NSR w/o acute changes noted.  - EKG 12-Lead  4. Fall from ladder, initial encounter Comments: Occurred on 8/2. This did result in flank pain, which is now resolving. She is encouraged to refrain from climbing ladders in the future.   5. Vitamin D deficiency  I WILL CHECK A VIT D LEVEL AND SUPPLEMENT AS NEEDED.  ALSO ENCOURAGED TO SPEND 15 MINUTES IN THE SUN DAILY.  - VITAMIN D 25 Hydroxy (Vit-D Deficiency, Fractures)  6. Anemia due to vitamin B12 deficiency, unspecified B12 deficiency type  I will check vitamin B12 level. She was also given vitamin B12 injection.   - Vitamin B12 - cyanocobalamin ((VITAMIN B-12)) injection 1,000 mcg   7. Drug therapy Comments: Advised to decrease Dexilant dosing to M-F. I plan to decrease further to MWF dosing at her next visit. She is in agreement with her treatment plan.   8. Class 1 obesity due to excess calories with serious comorbidity and body mass index (BMI) of 33.0 to 33.9 in adult She is encouraged to strive for BMI less than 30 to decrease cardiac risk. Advised to aim for at least 150 minutes of exercise per week.    Patient was given opportunity to ask questions. Patient verbalized understanding of the plan and was able to repeat key elements of the plan. All questions were answered to their satisfaction.   Michele Greenland, MD   I, Michele Greenland, MD, have reviewed all documentation for this visit. The documentation on 03/05/20 for the exam, diagnosis, procedures, and orders are all accurate and complete.  THE PATIENT IS ENCOURAGED TO PRACTICE SOCIAL DISTANCING DUE TO THE COVID-19 PANDEMIC.

## 2020-02-24 NOTE — Progress Notes (Signed)
This visit occurred during the SARS-CoV-2 public health emergency.  Safety protocols were in place, including screening questions prior to the visit, additional usage of staff PPE, and extensive cleaning of exam room while observing appropriate contact time as indicated for disinfecting solutions.  Subjective:   Michele Pope is a 69 y.o. female who presents for Medicare Annual (Subsequent) preventive examination.  Review of Systems     Cardiac Risk Factors include: advanced age (>42mn, >>4women);diabetes mellitus;hypertension;obesity (BMI >30kg/m2);sedentary lifestyle     Objective:    Today's Vitals   02/24/20 1034  BP: 130/74  Pulse: 82  Temp: 97.9 F (36.6 C)  TempSrc: Oral  SpO2: 97%  Weight: 205 lb 9.6 oz (93.3 kg)  Height: 5' 6"  (1.676 m)   Body mass index is 33.18 kg/m.  Advanced Directives 02/24/2020 02/17/2019  Does Patient Have a Medical Advance Directive? No No  Would patient like information on creating a medical advance directive? Yes (MAU/Ambulatory/Procedural Areas - Information given) No - Patient declined    Current Medications (verified) Outpatient Encounter Medications as of 02/24/2020  Medication Sig  . Apoaequorin (PREVAGEN PO) Take by mouth 2 (two) times a week.   . Blood Glucose Monitoring Suppl (ONE TOUCH ULTRA 2) w/Device KIT Use as directed to check blood sugars 1 time per day dx: e11.22  . Cholecalciferol (VITAMIN D3) 125 MCG (5000 UT) CAPS Take by mouth.  . DEXILANT 60 MG capsule TAKE 1 CAPSULE BY MOUTH EVERY DAY  . diclofenac sodium (VOLTAREN) 1 % GEL Apply topically 4 (four) times daily.  .Marland Kitchenezetimibe (ZETIA) 10 MG tablet TAKE 1 TABLET BY MOUTH EVERY DAY  . glucose blood (ONE TOUCH ULTRA TEST) test strip Use as instructed to check blood sugars 1 time per day dx: e11.65  . JANUMET XR 50-1000 MG TB24 TAKE 1 TABLET BY MOUTH TWICE A DAY  . levocetirizine (XYZAL) 5 MG tablet Take 5 mg by mouth every evening.  .Marland Kitchenlisinopril (ZESTRIL) 10 MG tablet TAKE  1 TABLET BY MOUTH EVERY DAY  . loratadine (CLARITIN) 10 MG tablet TAKE 1 TABLET BY MOUTH EVERY DAY  . mometasone (NASONEX) 50 MCG/ACT nasal spray SPRAY 2 SPRAYS INTO EACH NOSTRIL EVERY DAY  . naproxen (NAPROSYN) 500 MG tablet Take 500 mg by mouth as needed.   . pneumococcal 13-valent conjugate vaccine (PREVNAR 13) SUSP injection Inject 0.5 mLs into the muscle tomorrow at 10 am for 1 dose.  .Marland KitchenVASCEPA 1 g capsule TAKE 2 CAPSULES BY MOUTH TWICE A DAY WITH FOOD *DO NOT CRUSH,OPEN,DISSOLVE OR CHEW (Patient taking differently: 1 capsule daily)  . Zoster Vaccine Adjuvanted ([facility] injection Inject 0.5 mLs into the muscle once for 1 dose.   No facility-administered encounter medications on file as of 02/24/2020.    Allergies (verified) Meloxicam and Pitavastatin   History: Past Medical History:  Diagnosis Date  . Diabetes mellitus without complication ([facility]   . High cholesterol   . HTN (hypertension)    Past Surgical History:  Procedure Laterality Date  . BREAST EXCISIONAL BIOPSY Right   . SHOULDER ARTHROSCOPY W/ ROTATOR CUFF REPAIR  08/22/2018  . TOTAL ABDOMINAL HYSTERECTOMY  1995   Family History  Problem Relation Age of Onset  . Heart attack Mother   . Blindness Father    Social History   Socioeconomic History  . Marital status: Married    Spouse name: Not on file  . Number of children: Not on file  . Years of education: Not on file  . Highest  education level: Not on file  Occupational History  . Not on file  Tobacco Use  . Smoking status: Never Smoker  . Smokeless tobacco: Never Used  Vaping Use  . Vaping Use: Never used  Substance and Sexual Activity  . Alcohol use: Never  . Drug use: Never  . Sexual activity: Yes  Other Topics Concern  . Not on file  Social History Narrative  . Not on file   Social Determinants of Health   Financial Resource Strain: Low Risk   . Difficulty of Paying Living Expenses: Not hard at all  Food Insecurity: No Food Insecurity  .  Worried About Charity fundraiser in the Last Year: Never true  . Ran Out of Food in the Last Year: Never true  Transportation Needs: No Transportation Needs  . Lack of Transportation (Medical): No  . Lack of Transportation (Non-Medical): No  Physical Activity: Inactive  . Days of Exercise per Week: 0 days  . Minutes of Exercise per Session: 0 min  Stress: No Stress Concern Present  . Feeling of Stress : Not at all  Social Connections:   . Frequency of Communication with Friends and Family:   . Frequency of Social Gatherings with Friends and Family:   . Attends Religious Services:   . Active Member of Clubs or Organizations:   . Attends Archivist Meetings:   Marland Kitchen Marital Status:     Tobacco Counseling Counseling given: Not Answered   Clinical Intake:  Pre-visit preparation completed: Yes  Pain : No/denies pain     Nutritional Status: BMI > 30  Obese Nutritional Risks: None Diabetes: Yes  How often do you need to have someone help you when you read instructions, pamphlets, or other written materials from your doctor or pharmacy?: 1 - Never What is the last grade level you completed in school?: associates degree  Diabetic? Yes Nutrition Risk Assessment:  Has the patient had any N/V/D within the last 2 months?  No  Does the patient have any non-healing wounds?  No  Has the patient had any unintentional weight loss or weight gain?  No   Diabetes:  Is the patient diabetic?  Yes  If diabetic, was a CBG obtained today?  No  Did the patient bring in their glucometer from home?  No  How often do you monitor your CBG's? 3-4 times weekly.   Financial Strains and Diabetes Management:  Are you having any financial strains with the device, your supplies or your medication? No .  Does the patient want to be seen by Chronic Care Management for management of their diabetes?  No  Would the patient like to be referred to a Nutritionist or for Diabetic Management?  No    Diabetic Exams:  Diabetic Eye Exam: Completed 12/14/2019 Diabetic Foot Exam: Overdue, Pt has been advised about the importance in completing this exam. Pt is scheduled for diabetic foot exam on today.   Interpreter Needed?: No  Information entered by :: NAllen LPN   Activities of Daily Living In your present state of health, do you have any difficulty performing the following activities: 02/24/2020 02/23/2020  Hearing? N N  Vision? N N  Difficulty concentrating or making decisions? N N  Walking or climbing stairs? N N  Dressing or bathing? N N  Doing errands, shopping? N N  Preparing Food and eating ? N -  Using the Toilet? N -  In the past six months, have you accidently leaked urine? N -  Do you have problems with loss of bowel control? N -  Managing your Medications? N -  Managing your Finances? N -  Housekeeping or managing your Housekeeping? N -  Some recent data might be hidden    Patient Care Team: Glendale Chard, MD as PCP - General (Internal Medicine)  Indicate any recent Medical Services you may have received from other than [facility] providers in the past year (date may be approximate).     Assessment:   This is a routine wellness examination for [place].  Hearing/Vision screen  Hearing Screening   125Hz  250Hz  500Hz  1000Hz  2000Hz  3000Hz  4000Hz  6000Hz  8000Hz   Right ear:           Left ear:           Vision Screening Comments: Regular eye exams, [facility]  Dietary issues and exercise activities discussed: Current Exercise Habits: The patient does not participate in regular exercise at present  Goals    . Increase physical activity    . Patient Stated     02/24/2020, no goals      Depression Screen PHQ 2/9 Scores 02/24/2020 02/23/2020 02/17/2019 11/11/2018 09/15/2018 06/27/2018 05/14/2018  PHQ - 2 Score 0 0 0 0 0 0 0  PHQ- 9 Score 1 - - - - - -    Fall Risk Fall Risk  02/24/2020 02/23/2020 12/10/2018 11/11/2018 09/15/2018  Falls in the past year?  1 1 1  0 1  Comment slipped off ladder - - - -  Number falls in past yr: 0 0 0 - 0  Injury with Fall? 0 0 1 - 1  Comment - fell off of a ladder because it had a broken leg. - - -  Risk for fall due to : History of fall(s);Medication side effect - - - -    Any stairs in or around the home? Yes  If so, are there any without handrails? Yes  Home free of loose throw rugs in walkways, pet beds, electrical cords, etc? Yes  Adequate lighting in your home to reduce risk of falls? Yes   ASSISTIVE DEVICES UTILIZED TO PREVENT FALLS:  Life alert? No  Use of a cane, walker or w/c? No  Grab bars in the bathroom? Yes  Shower chair or bench in shower? Yes  Elevated toilet seat or a handicapped toilet? Yes   TIMED UP AND GO:  Was the test performed? No .    Gait steady and fast without use of assistive device  Cognitive Function:     6CIT Screen 02/24/2020 02/17/2019  What Year? 0 points 0 points  What month? 0 points 0 points  What time? 0 points 0 points  Count back from 20 2 points 0 points  Months in reverse 0 points 0 points  Repeat phrase 4 points 0 points  Total Score 6 0    Immunizations Immunization History  Administered Date(s) Administered  . Influenza, High Dose Seasonal PF 05/14/2018  . Influenza,inj,Quad PF,6+ Mos 04/16/2019  . Influenza,inj,quad, With Preservative 04/22/2018  . Influenza-Unspecified 04/20/2019  . Moderna SARS-COVID-2 Vaccination 08/24/2019, 09/18/2019  . Pneumococcal Polysaccharide-23 05/03/2009, 02/17/2019  . Tdap 09/15/2018    TDAP status: Up to date Flu Vaccine status: Up to date Pneumococcal vaccine status: sent to pharmacy Covid-19 vaccine status: Completed vaccines  Qualifies for Shingles Vaccine? Yes   Zostavax completed No   Shingrix Completed?: No.    Education has been provided regarding the importance of this vaccine. Patient has been advised  to call insurance company to determine out of pocket expense if they have not yet received this  vaccine. Advised may also receive vaccine at local pharmacy or Health Dept. Verbalized acceptance and understanding.  Screening Tests Health Maintenance  Topic Date Due  . OPHTHALMOLOGY EXAM  01/19/2020  . FOOT EXAM  02/17/2020  . PNA vac Low Risk Adult (2 of 2 - PCV13) 02/17/2020  . INFLUENZA VACCINE  02/21/2020  . HEMOGLOBIN A1C  03/30/2020  . MAMMOGRAM  11/15/2021  . COLONOSCOPY  01/12/2026  . TETANUS/TDAP  09/15/2028  . DEXA SCAN  Completed  . COVID-19 Vaccine  Completed  . Hepatitis C Screening  Completed    Health Maintenance  Health Maintenance Due  Topic Date Due  . OPHTHALMOLOGY EXAM  01/19/2020  . FOOT EXAM  02/17/2020  . PNA vac Low Risk Adult (2 of 2 - PCV13) 02/17/2020  . INFLUENZA VACCINE  02/21/2020    Colorectal cancer screening: Completed 01/13/2016. Repeat every 10 years Mammogram status: Completed 11/16/2019. Repeat every year Bone Density status: Completed 05/08/2019.   Lung Cancer Screening: (Low Dose CT Chest recommended if Age 80-80 years, 30 pack-year currently smoking OR have quit w/in 15years.) does not qualify.   Lung Cancer Screening Referral: no  Additional Screening:  Hepatitis C Screening: does qualify; Completed 08/16/2012  Vision Screening: Recommended annual ophthalmology exams for early detection of glaucoma and other disorders of the eye. Is the patient up to date with their annual eye exam?  Yes  Who is the provider or what is the name of the office in which the patient attends annual eye exams? [place] If pt is not established with a provider, would they like to be referred to a provider to establish care? No .   Dental Screening: Recommended annual dental exams for proper oral hygiene  Community Resource Referral / Chronic Care Management: CRR required this visit?  No   CCM required this visit?  No      Plan:     I have personally reviewed and noted the following in the patient's chart:   . Medical and  social history . Use of alcohol, tobacco or illicit drugs  . Current medications and supplements . Functional ability and status . Nutritional status . Physical activity . Advanced directives . List of other physicians . Hospitalizations, surgeries, and ER visits in previous 12 months . Vitals . Screenings to include cognitive, depression, and falls . Referrals and appointments  In addition, I have reviewed and discussed with patient certain preventive protocols, quality metrics, and best practice recommendations. A written personalized care plan for preventive services as well as general preventive health recommendations were provided to patient.     Kellie Simmering, LPN   [id number]   Nurse Notes: Requesting eye exam.

## 2020-02-24 NOTE — Patient Instructions (Addendum)
Change Dexilant dosing to M-F  ONLY Skip the weekends   Health Maintenance After Age 67 After age 43, you are at a higher risk for certain long-term diseases and infections as well as injuries from falls. Falls are a major cause of broken bones and head injuries in people who are older than age 26. Getting regular preventive care can help to keep you healthy and well. Preventive care includes getting regular testing and making lifestyle changes as recommended by your health care provider. Talk with your health care provider about:  Which screenings and tests you should have. A screening is a test that checks for a disease when you have no symptoms.  A diet and exercise plan that is right for you. What should I know about screenings and tests to prevent falls? Screening and testing are the best ways to find a health problem early. Early diagnosis and treatment give you the best chance of managing medical conditions that are common after age 14. Certain conditions and lifestyle choices may make you more likely to have a fall. Your health care provider may recommend:  Regular vision checks. Poor vision and conditions such as cataracts can make you more likely to have a fall. If you wear glasses, make sure to get your prescription updated if your vision changes.  Medicine review. Work with your health care provider to regularly review all of the medicines you are taking, including over-the-counter medicines. Ask your health care provider about any side effects that may make you more likely to have a fall. Tell your health care provider if any medicines that you take make you feel dizzy or sleepy.  Osteoporosis screening. Osteoporosis is a condition that causes the bones to get weaker. This can make the bones weak and cause them to break more easily.  Blood pressure screening. Blood pressure changes and medicines to control blood pressure can make you feel dizzy.  Strength and balance checks. Your  health care provider may recommend certain tests to check your strength and balance while standing, walking, or changing positions.  Foot health exam. Foot pain and numbness, as well as not wearing proper footwear, can make you more likely to have a fall.  Depression screening. You may be more likely to have a fall if you have a fear of falling, feel emotionally low, or feel unable to do activities that you used to do.  Alcohol use screening. Using too much alcohol can affect your balance and may make you more likely to have a fall. What actions can I take to lower my risk of falls? General instructions  Talk with your health care provider about your risks for falling. Tell your health care provider if: ? You fall. Be sure to tell your health care provider about all falls, even ones that seem minor. ? You feel dizzy, sleepy, or off-balance.  Take over-the-counter and prescription medicines only as told by your health care provider. These include any supplements.  Eat a healthy diet and maintain a healthy weight. A healthy diet includes low-fat dairy products, low-fat (lean) meats, and fiber from whole grains, beans, and lots of fruits and vegetables. Home safety  Remove any tripping hazards, such as rugs, cords, and clutter.  Install safety equipment such as grab bars in bathrooms and safety rails on stairs.  Keep rooms and walkways well-lit. Activity   Follow a regular exercise program to stay fit. This will help you maintain your balance. Ask your health care provider what types of  exercise are appropriate for you.  If you need a cane or walker, use it as recommended by your health care provider.  Wear supportive shoes that have nonskid soles. Lifestyle  Do not drink alcohol if your health care provider tells you not to drink.  If you drink alcohol, limit how much you have: ? 0-1 drink a day for women. ? 0-2 drinks a day for men.  Be aware of how much alcohol is in your  drink. In the [place] one drink equals one typical bottle of beer (12 oz), one-half glass of wine (5 oz), or one shot of hard liquor (1 oz).  Do not use any products that contain nicotine or tobacco, such as cigarettes and e-cigarettes. If you need help quitting, ask your health care provider. Summary  Having a healthy lifestyle and getting preventive care can help to protect your health and wellness after age 67.  Screening and testing are the best way to find a health problem early and help you avoid having a fall. Early diagnosis and treatment give you the best chance for managing medical conditions that are more common for people who are older than age 34.  Falls are a major cause of broken bones and head injuries in people who are older than age 72. Take precautions to prevent a fall at home.  Work with your health care provider to learn what changes you can make to improve your health and wellness and to prevent falls. This information is not intended to replace advice given to you by your health care provider. Make sure you discuss any questions you have with your health care provider. Document Revised: 10/30/2018 Document Reviewed: 05/22/2017 Elsevier Patient Education  2020 [employer].

## 2020-02-24 NOTE — Patient Instructions (Signed)
Michele Pope , Thank you for taking time to come for your Medicare Wellness Visit. I appreciate your ongoing commitment to your health goals. Please review the following plan we discussed and let me know if I can assist you in the future.   Screening recommendations/referrals: Colonoscopy: completed 01/13/2016 Mammogram: completed 11/16/2019 Bone Density: completed 05/08/2019 Recommended yearly ophthalmology/optometry visit for glaucoma screening and checkup Recommended yearly dental visit for hygiene and checkup  Vaccinations: Influenza vaccine: due Pneumococcal vaccine: sent to pharmacy Tdap vaccine: completed 09/15/2018 Shingles vaccine: sent to pharmacy   Covid-19:09/18/2019, 08/24/2019  Advanced directives: Advance directive discussed with you today. I have provided a copy for you to complete at home and have notarized. Once this is complete please bring a copy in to our office so we can scan it into your chart.  Conditions/risks identified: none  Next appointment: Follow up in one year for your annual wellness visit    Preventive Care 65 Years and Older, Female Preventive care refers to lifestyle choices and visits with your health care provider that can promote health and wellness. What does preventive care include?  A yearly physical exam. This is also called an annual well check.  Dental exams once or twice a year.  Routine eye exams. Ask your health care provider how often you should have your eyes checked.  Personal lifestyle choices, including:  Daily care of your teeth and gums.  Regular physical activity.  Eating a healthy diet.  Avoiding tobacco and drug use.  Limiting alcohol use.  Practicing safe sex.  Taking low-dose aspirin every day.  Taking vitamin and mineral supplements as recommended by your health care provider. What happens during an annual well check? The services and screenings done by your health care provider during your annual well check  will depend on your age, overall health, lifestyle risk factors, and family history of disease. Counseling  Your health care provider may ask you questions about your:  Alcohol use.  Tobacco use.  Drug use.  Emotional well-being.  Home and relationship well-being.  Sexual activity.  Eating habits.  History of falls.  Memory and ability to understand (cognition).  Work and work Astronomer.  Reproductive health. Screening  You may have the following tests or measurements:  Height, weight, and BMI.  Blood pressure.  Lipid and cholesterol levels. These may be checked every 5 years, or more frequently if you are over 19 years old.  Skin check.  Lung cancer screening. You may have this screening every year starting at age 74 if you have a 30-pack-year history of smoking and currently smoke or have quit within the past 15 years.  Fecal occult blood test (FOBT) of the stool. You may have this test every year starting at age 27.  Flexible sigmoidoscopy or colonoscopy. You may have a sigmoidoscopy every 5 years or a colonoscopy every 10 years starting at age 59.  Hepatitis C blood test.  Hepatitis B blood test.  Sexually transmitted disease (STD) testing.  Diabetes screening. This is done by checking your blood sugar (glucose) after you have not eaten for a while (fasting). You may have this done every 1-3 years.  Bone density scan. This is done to screen for osteoporosis. You may have this done starting at age 67.  Mammogram. This may be done every 1-2 years. Talk to your health care provider about how often you should have regular mammograms. Talk with your health care provider about your test results, treatment options, and if necessary, the  need for more tests. Vaccines  Your health care provider may recommend certain vaccines, such as:  Influenza vaccine. This is recommended every year.  Tetanus, diphtheria, and acellular pertussis (Tdap, Td) vaccine. You may  need a Td booster every 10 years.  Zoster vaccine. You may need this after age 64.  Pneumococcal 13-valent conjugate (PCV13) vaccine. One dose is recommended after age 22.  Pneumococcal polysaccharide (PPSV23) vaccine. One dose is recommended after age 24. Talk to your health care provider about which screenings and vaccines you need and how often you need them. This information is not intended to replace advice given to you by your health care provider. Make sure you discuss any questions you have with your health care provider. Document Released: 08/05/2015 Document Revised: 03/28/2016 Document Reviewed: 05/10/2015 Elsevier Interactive Patient Education  2017 [place] Prevention in the Home Falls can cause injuries. They can happen to people of all ages. There are many things you can do to make your home safe and to help prevent falls. What can I do on the outside of my home?  Regularly fix the edges of walkways and driveways and fix any cracks.  Remove anything that might make you trip as you walk through a door, such as a raised step or threshold.  Trim any bushes or trees on the path to your home.  Use bright outdoor lighting.  Clear any walking paths of anything that might make someone trip, such as rocks or tools.  Regularly check to see if handrails are loose or broken. Make sure that both sides of any steps have handrails.  Any raised decks and porches should have guardrails on the edges.  Have any leaves, snow, or ice cleared regularly.  Use sand or salt on walking paths during winter.  Clean up any spills in your garage right away. This includes oil or grease spills. What can I do in the bathroom?  Use night lights.  Install grab bars by the toilet and in the tub and shower. Do not use towel bars as grab bars.  Use non-skid mats or decals in the tub or shower.  If you need to sit down in the shower, use a plastic, non-slip stool.  Keep the floor  dry. Clean up any water that spills on the floor as soon as it happens.  Remove soap buildup in the tub or shower regularly.  Attach bath mats securely with double-sided non-slip rug tape.  Do not have throw rugs and other things on the floor that can make you trip. What can I do in the bedroom?  Use night lights.  Make sure that you have a light by your bed that is easy to reach.  Do not use any sheets or blankets that are too big for your bed. They should not hang down onto the floor.  Have a firm chair that has side arms. You can use this for support while you get dressed.  Do not have throw rugs and other things on the floor that can make you trip. What can I do in the kitchen?  Clean up any spills right away.  Avoid walking on wet floors.  Keep items that you use a lot in easy-to-reach places.  If you need to reach something above you, use a strong step stool that has a grab bar.  Keep electrical cords out of the way.  Do not use floor polish or wax that makes floors slippery. If you must use wax,  use non-skid floor wax.  Do not have throw rugs and other things on the floor that can make you trip. What can I do with my stairs?  Do not leave any items on the stairs.  Make sure that there are handrails on both sides of the stairs and use them. Fix handrails that are broken or loose. Make sure that handrails are as long as the stairways.  Check any carpeting to make sure that it is firmly attached to the stairs. Fix any carpet that is loose or worn.  Avoid having throw rugs at the top or bottom of the stairs. If you do have throw rugs, attach them to the floor with carpet tape.  Make sure that you have a light switch at the top of the stairs and the bottom of the stairs. If you do not have them, ask someone to add them for you. What else can I do to help prevent falls?  Wear shoes that:  Do not have high heels.  Have rubber bottoms.  Are comfortable and fit you  well.  Are closed at the toe. Do not wear sandals.  If you use a stepladder:  Make sure that it is fully opened. Do not climb a closed stepladder.  Make sure that both sides of the stepladder are locked into place.  Ask someone to hold it for you, if possible.  Clearly mark and make sure that you can see:  Any grab bars or handrails.  First and last steps.  Where the edge of each step is.  Use tools that help you move around (mobility aids) if they are needed. These include:  Canes.  Walkers.  Scooters.  Crutches.  Turn on the lights when you go into a dark area. Replace any light bulbs as soon as they burn out.  Set up your furniture so you have a clear path. Avoid moving your furniture around.  If any of your floors are uneven, fix them.  If there are any pets around you, be aware of where they are.  Review your medicines with your doctor. Some medicines can make you feel dizzy. This can increase your chance of falling. Ask your doctor what other things that you can do to help prevent falls. This information is not intended to replace advice given to you by your health care provider. Make sure you discuss any questions you have with your health care provider. Document Released: 05/05/2009 Document Revised: 12/15/2015 Document Reviewed: 08/13/2014 Elsevier Interactive Patient Education  2017 [employer].

## 2020-02-25 ENCOUNTER — Other Ambulatory Visit: Payer: Self-pay | Admitting: Internal Medicine

## 2020-02-25 LAB — CMP14+EGFR
ALT: 43 IU/L — ABNORMAL HIGH (ref 0–32)
AST: 47 IU/L — ABNORMAL HIGH (ref 0–40)
Albumin/Globulin Ratio: 1.3 (ref 1.2–2.2)
Albumin: 4.2 g/dL (ref 3.8–4.8)
Alkaline Phosphatase: 77 IU/L (ref 48–121)
BUN/Creatinine Ratio: 16 (ref 12–28)
BUN: 13 mg/dL (ref 8–27)
Bilirubin Total: 0.5 mg/dL (ref 0.0–1.2)
CO2: 22 mmol/L (ref 20–29)
Calcium: 9.8 mg/dL (ref 8.7–10.3)
Chloride: 105 mmol/L (ref 96–106)
Creatinine, Ser: 0.81 mg/dL (ref 0.57–1.00)
GFR calc Af Amer: 88 mL/min/{1.73_m2} (ref >59)
GFR calc non Af Amer: 76 mL/min/{1.73_m2} (ref >59)
Globulin, Total: 3.2 g/dL (ref 1.5–4.5)
Glucose: 89 mg/dL (ref 65–99)
Potassium: 4.3 mmol/L (ref 3.5–5.2)
Sodium: 144 mmol/L (ref 134–144)
Total Protein: 7.4 g/dL (ref 6.0–8.5)

## 2020-02-25 LAB — VITAMIN D 25 HYDROXY (VIT D DEFICIENCY, FRACTURES): Vit D, 25-Hydroxy: 50.6 ng/mL (ref 30.0–100.0)

## 2020-02-25 LAB — HEMOGLOBIN A1C
Est. average glucose Bld gHb Est-mCnc: 128 mg/dL
Hgb A1c MFr Bld: 6.1 % — ABNORMAL HIGH (ref 4.8–5.6)

## 2020-02-25 LAB — VITAMIN B12: Vitamin B-12: 725 pg/mL (ref 232–1245)

## 2020-02-25 LAB — CBC
Hematocrit: 39.2 % (ref 34.0–46.6)
Hemoglobin: 13.2 g/dL (ref 11.1–15.9)
MCH: 26.4 pg — ABNORMAL LOW (ref 26.6–33.0)
MCHC: 33.7 g/dL (ref 31.5–35.7)
MCV: 78 fL — ABNORMAL LOW (ref 79–97)
Platelets: 307 10*3/uL (ref 150–450)
RBC: 5 x10E6/uL (ref 3.77–5.28)
RDW: 15 % (ref 11.7–15.4)
WBC: 6.8 10*3/uL (ref 3.4–10.8)

## 2020-02-26 ENCOUNTER — Encounter: Payer: Self-pay | Admitting: Internal Medicine

## 2020-03-01 LAB — SPECIMEN STATUS REPORT

## 2020-03-01 LAB — IRON AND TIBC
Iron Saturation: 17 % (ref 15–55)
Iron: 59 ug/dL (ref 27–139)
Total Iron Binding Capacity: 354 ug/dL (ref 250–450)
UIBC: 295 ug/dL (ref 118–369)

## 2020-03-23 ENCOUNTER — Other Ambulatory Visit: Payer: Self-pay | Admitting: Internal Medicine

## 2020-04-22 ENCOUNTER — Other Ambulatory Visit: Payer: Self-pay | Admitting: Internal Medicine

## 2020-05-24 ENCOUNTER — Other Ambulatory Visit: Payer: Self-pay | Admitting: Internal Medicine

## 2020-06-04 ENCOUNTER — Other Ambulatory Visit: Payer: Self-pay | Admitting: Internal Medicine

## 2020-06-10 ENCOUNTER — Encounter: Payer: Self-pay | Admitting: Internal Medicine

## 2020-06-16 ENCOUNTER — Other Ambulatory Visit: Payer: Self-pay | Admitting: Internal Medicine

## 2020-07-05 ENCOUNTER — Encounter: Payer: Self-pay | Admitting: Internal Medicine

## 2020-07-05 ENCOUNTER — Other Ambulatory Visit: Payer: Self-pay

## 2020-07-05 ENCOUNTER — Ambulatory Visit (INDEPENDENT_AMBULATORY_CARE_PROVIDER_SITE_OTHER): Payer: Medicare Other | Admitting: Internal Medicine

## 2020-07-05 VITALS — BP 118/68 | HR 88 | Temp 97.9°F | Ht 66.0 in | Wt 198.4 lb

## 2020-07-05 DIAGNOSIS — I10 Essential (primary) hypertension: Secondary | ICD-10-CM | POA: Diagnosis not present

## 2020-07-05 DIAGNOSIS — E1165 Type 2 diabetes mellitus with hyperglycemia: Secondary | ICD-10-CM | POA: Diagnosis not present

## 2020-07-05 DIAGNOSIS — R1032 Left lower quadrant pain: Secondary | ICD-10-CM

## 2020-07-05 DIAGNOSIS — Z6832 Body mass index (BMI) 32.0-32.9, adult: Secondary | ICD-10-CM

## 2020-07-05 DIAGNOSIS — R6881 Early satiety: Secondary | ICD-10-CM | POA: Diagnosis not present

## 2020-07-05 DIAGNOSIS — R7989 Other specified abnormal findings of blood chemistry: Secondary | ICD-10-CM

## 2020-07-05 DIAGNOSIS — E6609 Other obesity due to excess calories: Secondary | ICD-10-CM

## 2020-07-05 NOTE — Progress Notes (Signed)
I,Tianna Badgett,acting as a Education administrator for [name], MD.,have documented all relevant documentation on the behalf of [name], MD,as directed by  [name], MD while in the presence of [name], MD.  This visit occurred during the SARS-CoV-2 public health emergency.  Safety protocols were in place, including screening questions prior to the visit, additional usage of staff PPE, and extensive cleaning of exam room while observing appropriate contact time as indicated for disinfecting solutions.  Subjective:     Patient ID: Michele Pope , female    DOB: Jun 15, 1951 , 68 y.o.   MRN: [id number]   Chief Complaint  Patient presents with   Hypertension   Diabetes    HPI  Patient is here today for htn and DM check. She reports compliance with medications.   Diabetes She presents for her follow-up diabetic visit. She has type 2 diabetes mellitus. There are no hypoglycemic associated symptoms. Pertinent negatives for diabetes include no blurred vision and no chest pain. There are no hypoglycemic complications. Risk factors for coronary artery disease include diabetes mellitus, dyslipidemia, hypertension and post-menopausal.  Hypertension This is a chronic problem. The current episode started more than 1 year ago. The problem has been gradually improving since onset. The problem is controlled. Pertinent negatives include no blurred vision, chest pain, palpitations or shortness of breath.     Past Medical History:  Diagnosis Date   Diabetes mellitus without complication (HCC)    High cholesterol    HTN (hypertension)      Family History  Problem Relation Age of Onset   Heart attack Mother    Blindness Father      Current Outpatient Medications:    Apoaequorin (PREVAGEN PO), Take by mouth 2 (two) times a week. , Disp: , Rfl:    Blood Glucose Monitoring Suppl (ONE TOUCH ULTRA 2) w/Device KIT, Use as directed to check blood sugars 1 time per day dx: e11.22,  Disp: 1 each, Rfl: 1   Cholecalciferol (VITAMIN D3) 125 MCG (5000 UT) CAPS, Take by mouth., Disp: , Rfl:    diclofenac sodium (VOLTAREN) 1 % GEL, Apply topically 4 (four) times daily., Disp: , Rfl:    ezetimibe (ZETIA) 10 MG tablet, TAKE 1 TABLET BY MOUTH EVERY DAY, Disp: 90 tablet, Rfl: 1   glucose blood (ONE TOUCH ULTRA TEST) test strip, Use as instructed to check blood sugars 1 time per day dx: e11.65, Disp: 100 each, Rfl: 11   icosapent Ethyl (VASCEPA) 1 g capsule, Take 1 capsule (1 g total) by mouth daily. 1 capsule daily, Disp: 90 capsule, Rfl: 1   levocetirizine (XYZAL) 5 MG tablet, Take 5 mg by mouth every evening., Disp: , Rfl:    lisinopril (ZESTRIL) 10 MG tablet, TAKE 1 TABLET BY MOUTH EVERY DAY, Disp: 90 tablet, Rfl: 1   loratadine (CLARITIN) 10 MG tablet, TAKE 1 TABLET BY MOUTH EVERY DAY, Disp: 90 tablet, Rfl: 1   mometasone (NASONEX) 50 MCG/ACT nasal spray, SPRAY 2 SPRAYS INTO EACH NOSTRIL EVERY DAY, Disp: 17 each, Rfl: 1   JANUMET XR 50-1000 MG TB24, TAKE 1 TABLET BY MOUTH TWICE A DAY, Disp: 180 tablet, Rfl: 1   Allergies  Allergen Reactions   Meloxicam Diarrhea   Pitavastatin      Review of Systems  Constitutional: Negative.   Eyes: Negative for blurred vision.  Respiratory: Negative.  Negative for shortness of breath.   Cardiovascular: Negative.  Negative for chest pain and palpitations.  Gastrointestinal: Positive for abdominal  pain.       She c/o LLQ pain. Also feels she gets full easily. States her sx started after getting COVID booster. There is some nausea, denies constipation. Though BM does help to relieve LLQ pain. Triggered usually after eating.   Neurological: Negative.      Today's Vitals   07/05/20 0946  BP: 118/68  Pulse: 88  Temp: 97.9 F (36.6 C)  TempSrc: Oral  Weight: 198 lb 6.4 oz (90 kg)  Height: _0  (1.676 m)   Body mass index is 32.02 kg/m.  Wt Readings from Last 3 Encounters:  07/05/20 198 lb 6.4 oz (90 kg)  02/24/20 205  lb 9.6 oz (93.3 kg)  02/24/20 205 lb 9.6 oz (93.3 kg)     Objective:  Physical Exam Vitals and nursing note reviewed.  Constitutional:      Appearance: Normal appearance.  HENT:     Head: Normocephalic and atraumatic.  Cardiovascular:     Rate and Rhythm: Normal rate and regular rhythm.     Heart sounds: Normal heart sounds.  Pulmonary:     Effort: Pulmonary effort is normal.     Breath sounds: Normal breath sounds.  Abdominal:     General: Bowel sounds are normal.     Palpations: Abdomen is soft.     Tenderness: There is abdominal tenderness.     Comments: LLQ tenderness to palpation  Skin:    General: Skin is warm.  Neurological:     General: No focal deficit present.     Mental Status: She is alert.  Psychiatric:        Mood and Affect: Mood normal.        Behavior: Behavior normal.         Assessment And Plan:     1. Uncontrolled type 2 diabetes mellitus with hyperglycemia (HCC) Comments: Chronic. I will check labs as listed below. I will adjust meds as needed.  - Amylase - Hemoglobin A1c - CMP14+EGFR  2. Essential hypertension, benign Comments: Chronic, well controlled. She will continue with current meds. She is encouraged to avoid adding salt to her foods.   3. LLQ pain Comments: She reports her sx started about 2 months ago, but she received her booster on 11/3. She agrees to CT abd/pelvis. I will make further recommendations once her results are available for review.  - CT ABDOMEN PELVIS W CONTRAST; Future  4. Early satiety Comments: She agrees to GI referral for possible EGD and further evaluation. She is not on GLP-1 therapy.  - Lipase - Ambulatory referral to Gastroenterology  5. Elevated LFTs Comments: Previous labs reviewed, I will recheck LFTs today along with GGT. She is encouraged to cut out sugary beverages and refined carbs.  - Gamma GT, GGT ([id number])  6. Class 1 obesity due to excess calories with serious comorbidity and body mass index  (BMI) of 32.0 to 32.9 in adult Comments: She has lost 7 pounds since August 2021. This was unintentional, though she does admit she cut out sodas b/c they were burning her stomach.  She is encouraged to strive for BMI less than 30 to decrease cardiac risk. Advised to aim for at least 150 minutes of exercise per week.     Patient was given opportunity to ask questions. Patient verbalized understanding of the plan and was able to repeat key elements of the plan. All questions were answered to their satisfaction.  [name], MD   I, [name], MD, have reviewed  all documentation for this visit. The documentation on 07/11/20 for the exam, diagnosis, procedures, and orders are all accurate and complete.  THE PATIENT IS ENCOURAGED TO PRACTICE SOCIAL DISTANCING DUE TO THE COVID-19 PANDEMIC.

## 2020-07-05 NOTE — Patient Instructions (Signed)

## 2020-07-06 LAB — CMP14+EGFR
ALT: 33 IU/L — ABNORMAL HIGH (ref 0–32)
AST: 38 IU/L (ref 0–40)
Albumin/Globulin Ratio: 1.4 (ref 1.2–2.2)
Albumin: 4.4 g/dL (ref 3.8–4.8)
Alkaline Phosphatase: 71 IU/L (ref 44–121)
BUN/Creatinine Ratio: 12 (ref 12–28)
BUN: 11 mg/dL (ref 8–27)
Bilirubin Total: 0.6 mg/dL (ref 0.0–1.2)
CO2: 23 mmol/L (ref 20–29)
Calcium: 9.8 mg/dL (ref 8.7–10.3)
Chloride: 104 mmol/L (ref 96–106)
Creatinine, Ser: 0.94 mg/dL (ref 0.57–1.00)
GFR calc Af Amer: 73 mL/min/{1.73_m2} (ref >59)
GFR calc non Af Amer: 63 mL/min/{1.73_m2} (ref >59)
Globulin, Total: 3.1 g/dL (ref 1.5–4.5)
Glucose: 85 mg/dL (ref 65–99)
Potassium: 4.2 mmol/L (ref 3.5–5.2)
Sodium: 141 mmol/L (ref 134–144)
Total Protein: 7.5 g/dL (ref 6.0–8.5)

## 2020-07-06 LAB — AMYLASE: Amylase: 56 U/L (ref 31–110)

## 2020-07-06 LAB — LIPASE: Lipase: 49 U/L (ref 14–72)

## 2020-07-06 LAB — HEMOGLOBIN A1C
Est. average glucose Bld gHb Est-mCnc: 123 mg/dL
Hgb A1c MFr Bld: 5.9 % — ABNORMAL HIGH (ref 4.8–5.6)

## 2020-07-06 LAB — GAMMA GT: GGT: 31 IU/L (ref 0–60)

## 2020-07-08 ENCOUNTER — Other Ambulatory Visit: Payer: Self-pay | Admitting: Internal Medicine

## 2020-07-15 ENCOUNTER — Other Ambulatory Visit: Payer: Self-pay | Admitting: Internal Medicine

## 2020-07-20 ENCOUNTER — Other Ambulatory Visit: Payer: Self-pay | Admitting: Internal Medicine

## 2020-08-05 ENCOUNTER — Other Ambulatory Visit: Payer: Self-pay | Admitting: Internal Medicine

## 2020-08-09 ENCOUNTER — Other Ambulatory Visit: Payer: Self-pay | Admitting: Internal Medicine

## 2020-08-24 ENCOUNTER — Ambulatory Visit: Payer: Medicare Other | Admitting: Internal Medicine

## 2020-08-24 ENCOUNTER — Other Ambulatory Visit: Payer: Self-pay | Admitting: Internal Medicine

## 2020-10-19 ENCOUNTER — Other Ambulatory Visit: Payer: Self-pay | Admitting: Internal Medicine

## 2020-10-19 ENCOUNTER — Encounter: Payer: Self-pay | Admitting: Internal Medicine

## 2020-11-07 ENCOUNTER — Encounter: Payer: Self-pay | Admitting: Internal Medicine

## 2020-11-07 ENCOUNTER — Other Ambulatory Visit: Payer: Self-pay

## 2020-11-07 ENCOUNTER — Ambulatory Visit (INDEPENDENT_AMBULATORY_CARE_PROVIDER_SITE_OTHER): Payer: Medicare Other | Admitting: Internal Medicine

## 2020-11-07 VITALS — BP 114/78 | HR 85 | Temp 98.2°F | Ht 66.0 in | Wt 194.8 lb

## 2020-11-07 DIAGNOSIS — E1165 Type 2 diabetes mellitus with hyperglycemia: Secondary | ICD-10-CM | POA: Diagnosis not present

## 2020-11-07 DIAGNOSIS — I10 Essential (primary) hypertension: Secondary | ICD-10-CM | POA: Diagnosis not present

## 2020-11-07 DIAGNOSIS — Z6831 Body mass index (BMI) 31.0-31.9, adult: Secondary | ICD-10-CM

## 2020-11-07 DIAGNOSIS — E6609 Other obesity due to excess calories: Secondary | ICD-10-CM | POA: Diagnosis not present

## 2020-11-07 DIAGNOSIS — Z23 Encounter for immunization: Secondary | ICD-10-CM

## 2020-11-07 MED ORDER — PREVNAR 20 0.5 ML IM SUSY: 0.5000 mL | PREFILLED_SYRINGE | INTRAMUSCULAR | 0 refills | Status: AC

## 2020-11-07 NOTE — Progress Notes (Signed)
I,[name],acting as a Education administrator for [name], MD.,have documented all relevant documentation on the behalf of [name], MD,as directed by  [name], MD while in the presence of [name], MD.  This visit occurred during the SARS-CoV-2 public health emergency.  Safety protocols were in place, including screening questions prior to the visit, additional usage of staff PPE, and extensive cleaning of exam room while observing appropriate contact time as indicated for disinfecting solutions.  Subjective:     Patient ID: Michele Pope , female    DOB: February 19, 1951 , 70 y.o.   MRN: [id number]   Chief Complaint  Patient presents with  . Diabetes  . Hypertension    HPI  Patient is here today for htn and DM check. She reports compliance with medications. She states her sugars run between 85-130. States the average BS is about 100.   Diabetes She presents for her follow-up diabetic visit. She has type 2 diabetes mellitus. There are no hypoglycemic associated symptoms. Pertinent negatives for diabetes include no blurred vision and no chest pain. There are no hypoglycemic complications. Risk factors for coronary artery disease include diabetes mellitus, dyslipidemia, hypertension and post-menopausal.  Hypertension This is a chronic problem. The current episode started more than 1 year ago. The problem has been gradually improving since onset. The problem is controlled. Pertinent negatives include no blurred vision, chest pain, palpitations or shortness of breath.     Past Medical History:  Diagnosis Date  . Diabetes mellitus without complication ([place])   . High cholesterol   . HTN (hypertension)      Family History  Problem Relation Age of Onset  . Heart attack Mother   . Blindness Father      Current Outpatient Medications:  .  Blood Glucose Monitoring Suppl (ONE TOUCH ULTRA 2) w/Device KIT, Use as directed to check blood sugars 1 time per day dx: e11.22,  Disp: 1 each, Rfl: 1 .  Cholecalciferol (VITAMIN D3) 125 MCG (5000 UT) CAPS, Take by mouth., Disp: , Rfl:  .  dexlansoprazole (DEXILANT) 60 MG capsule, 1 cap(s), Disp: , Rfl:  .  diclofenac sodium (VOLTAREN) 1 % GEL, Apply topically 4 (four) times daily., Disp: , Rfl:  .  ezetimibe (ZETIA) 10 MG tablet, TAKE 1 TABLET BY MOUTH EVERY DAY, Disp: 90 tablet, Rfl: 1 .  glucose blood (ONE TOUCH ULTRA TEST) test strip, Use as instructed to check blood sugars 1 time per day dx: e11.65, Disp: 100 each, Rfl: 11 .  icosapent Ethyl (VASCEPA) 1 g capsule, Take 1 capsule (1 g total) by mouth daily. 1 capsule daily, Disp: 90 capsule, Rfl: 1 .  JANUMET XR 50-1000 MG TB24, TAKE 1 TABLET BY MOUTH TWICE A DAY, Disp: 180 tablet, Rfl: 1 .  levocetirizine (XYZAL) 5 MG tablet, Take 5 mg by mouth every evening., Disp: , Rfl:  .  lisinopril (ZESTRIL) 10 MG tablet, TAKE 1 TABLET BY MOUTH EVERY DAY, Disp: 90 tablet, Rfl: 1 .  loratadine (CLARITIN) 10 MG tablet, TAKE 1 TABLET BY MOUTH EVERY DAY, Disp: 90 tablet, Rfl: 1 .  Misc Natural Products (NEURIVA PO), Take by mouth. 1 per day, Disp: , Rfl:  .  mometasone (NASONEX) 50 MCG/ACT nasal spray, SPRAY 2 SPRAYS INTO EACH NOSTRIL EVERY DAY, Disp: 51 each, Rfl: 1   Allergies  Allergen Reactions  . Meloxicam Diarrhea  . Pitavastatin      Review of Systems  Constitutional: Negative.   Eyes: Negative for blurred  vision.  Respiratory: Negative.  Negative for shortness of breath.   Cardiovascular: Negative.  Negative for chest pain and palpitations.  Gastrointestinal: Negative.   Psychiatric/Behavioral: Negative.   All other systems reviewed and are negative.    Today's Vitals   11/07/20 1007  BP: 114/78  Pulse: 85  Temp: 98.2 F (36.8 C)  TempSrc: Oral  Weight: 194 lb 12.8 oz (88.4 kg)  Height: 5' 6"  (1.676 m)  PainSc: 1   PainLoc: Abdomen   Body mass index is 31.44 kg/m.  Wt Readings from Last 3 Encounters:  11/07/20 194 lb 12.8 oz (88.4 kg)  07/05/20 198  lb 6.4 oz (90 kg)  02/24/20 205 lb 9.6 oz (93.3 kg)   Objective:  Physical Exam Vitals and nursing note reviewed.  Constitutional:      Appearance: Normal appearance.  HENT:     Head: Normocephalic and atraumatic.     Nose:     Comments: Masked     Mouth/Throat:     Comments: Masked  Cardiovascular:     Rate and Rhythm: Normal rate and regular rhythm.     Heart sounds: Normal heart sounds.  Pulmonary:     Effort: Pulmonary effort is normal.     Breath sounds: Normal breath sounds.  Musculoskeletal:     Cervical back: Normal range of motion.  Skin:    General: Skin is warm.  Neurological:     General: No focal deficit present.     Mental Status: She is alert.  Psychiatric:        Mood and Affect: Mood normal.        Behavior: Behavior normal.         Assessment And Plan:     1. Uncontrolled type 2 diabetes mellitus with hyperglycemia ([place]) Comments: I will check labs as listed below. She was commended for cutting out sodas.  She will rto in August 2022 for her next physical exam. - Hemoglobin A1c - BMP8+EGFR - Lipid panel  2. Essential hypertension, benign Comments: Chronic, well controlled. She will continue with current meds. She is encouraged to limit her salt intake.   3. Class 1 obesity due to excess calories with serious comorbidity and body mass index (BMI) of 31.0 to 31.9 in adult Comments: She was congratulated on losing 4 pounds since her last visit. She was commended for cutting out sodas and cutting back on sweets.   4. Immunization due Comments: I will send Prevnar-20 to her local pharmacy.  - Pneumococcal conjugate vaccine 20-valent (Prevnar 20)  Patient was given opportunity to ask questions. Patient verbalized understanding of the plan and was able to repeat key elements of the plan. All questions were answered to their satisfaction.   I, [name], MD, have reviewed all documentation for this visit. The documentation on 11/07/20 for the  exam, diagnosis, procedures, and orders are all accurate and complete.   IF YOU HAVE BEEN REFERRED TO A SPECIALIST, IT MAY TAKE 1-2 WEEKS TO SCHEDULE/PROCESS THE REFERRAL. IF YOU HAVE NOT HEARD FROM US/SPECIALIST IN TWO WEEKS, PLEASE GIVE [place] A CALL AT [phone] X [phone].   THE PATIENT IS ENCOURAGED TO PRACTICE SOCIAL DISTANCING DUE TO THE COVID-19 PANDEMIC.

## 2020-11-07 NOTE — Patient Instructions (Signed)
Diabetes Mellitus and Foot Care Foot care is an important part of your health, especially when you have diabetes. Diabetes may cause you to have problems because of poor blood flow (circulation) to your feet and legs, which can cause your skin to:  Become thinner and drier.  Break more easily.  Heal more slowly.  Peel and crack. You may also have nerve damage (neuropathy) in your legs and feet, causing decreased feeling in them. This means that you may not notice minor injuries to your feet that could lead to more serious problems. Noticing and addressing any potential problems early is the best way to prevent future foot problems. How to care for your feet Foot hygiene  Wash your feet daily with warm water and mild soap. Do not use hot water. Then, pat your feet and the areas between your toes until they are completely dry. Do not soak your feet as this can dry your skin.  Trim your toenails straight across. Do not dig under them or around the cuticle. File the edges of your nails with an emery board or nail file.  Apply a moisturizing lotion or petroleum jelly to the skin on your feet and to dry, brittle toenails. Use lotion that does not contain alcohol and is unscented. Do not apply lotion between your toes.   Shoes and socks  Wear clean socks or stockings every day. Make sure they are not too tight. Do not wear knee-high stockings since they may decrease blood flow to your legs.  Wear shoes that fit properly and have enough cushioning. Always look in your shoes before you put them on to be sure there are no objects inside.  To break in new shoes, wear them for just a few hours a day. This prevents injuries on your feet. Wounds, scrapes, corns, and calluses  Check your feet daily for blisters, cuts, bruises, sores, and redness. If you cannot see the bottom of your feet, use a mirror or ask someone for help.  Do not cut corns or calluses or try to remove them with medicine.  If you  find a minor scrape, cut, or break in the skin on your feet, keep it and the skin around it clean and dry. You may clean these areas with mild soap and water. Do not clean the area with peroxide, alcohol, or iodine.  If you have a wound, scrape, corn, or callus on your foot, look at it several times a day to make sure it is healing and not infected. Check for: ? Redness, swelling, or pain. ? Fluid or blood. ? Warmth. ? Pus or a bad smell.   General tips  Do not cross your legs. This may decrease blood flow to your feet.  Do not use heating pads or hot water bottles on your feet. They may burn your skin. If you have lost feeling in your feet or legs, you may not know this is happening until it is too late.  Protect your feet from hot and cold by wearing shoes, such as at the beach or on hot pavement.  Schedule a complete foot exam at least once a year (annually) or more often if you have foot problems. Report any cuts, sores, or bruises to your health care provider immediately. Where to find more information  American Diabetes Association: www.diabetes.org  Association of Diabetes Care & Education Specialists: www.diabeteseducator.org Contact a health care provider if:  You have a medical condition that increases your risk of infection and   you have any cuts, sores, or bruises on your feet.  You have an injury that is not healing.  You have redness on your legs or feet.  You feel burning or tingling in your legs or feet.  You have pain or cramps in your legs and feet.  Your legs or feet are numb.  Your feet always feel cold.  You have pain around any toenails. Get help right away if:  You have a wound, scrape, corn, or callus on your foot and: ? You have pain, swelling, or redness that gets worse. ? You have fluid or blood coming from the wound, scrape, corn, or callus. ? Your wound, scrape, corn, or callus feels warm to the touch. ? You have pus or a bad smell coming from  the wound, scrape, corn, or callus. ? You have a fever. ? You have a red line going up your leg. Summary  Check your feet every day for blisters, cuts, bruises, sores, and redness.  Apply a moisturizing lotion or petroleum jelly to the skin on your feet and to dry, brittle toenails.  Wear shoes that fit properly and have enough cushioning.  If you have foot problems, report any cuts, sores, or bruises to your health care provider immediately.  Schedule a complete foot exam at least once a year (annually) or more often if you have foot problems. This information is not intended to replace advice given to you by your health care provider. Make sure you discuss any questions you have with your health care provider. Document Revised: 01/28/2020 Document Reviewed: 01/28/2020 Elsevier Patient Education  2021 Elsevier Inc.  

## 2020-11-08 LAB — BMP8+EGFR
BUN/Creatinine Ratio: 10 — ABNORMAL LOW (ref 12–28)
BUN: 9 mg/dL (ref 8–27)
CO2: 22 mmol/L (ref 20–29)
Calcium: 10.1 mg/dL (ref 8.7–10.3)
Chloride: 103 mmol/L (ref 96–106)
Creatinine, Ser: 0.89 mg/dL (ref 0.57–1.00)
Glucose: 86 mg/dL (ref 65–99)
Potassium: 4.6 mmol/L (ref 3.5–5.2)
Sodium: 142 mmol/L (ref 134–144)
eGFR: 71 mL/min/{1.73_m2} (ref >59)

## 2020-11-08 LAB — LIPID PANEL
Chol/HDL Ratio: 3.8 ratio (ref 0.0–4.4)
Cholesterol, Total: 149 mg/dL (ref 100–199)
HDL: 39 mg/dL — ABNORMAL LOW (ref >39)
LDL Chol Calc (NIH): 82 mg/dL (ref 0–99)
Triglycerides: 160 mg/dL — ABNORMAL HIGH (ref 0–149)
VLDL Cholesterol Cal: 28 mg/dL (ref 5–40)

## 2020-11-08 LAB — HEMOGLOBIN A1C
Est. average glucose Bld gHb Est-mCnc: 123 mg/dL
Hgb A1c MFr Bld: 5.9 % — ABNORMAL HIGH (ref 4.8–5.6)

## 2020-11-18 LAB — HM MAMMOGRAPHY: HM Mammogram: NORMAL (ref 0–4)

## 2020-11-30 ENCOUNTER — Encounter: Payer: Self-pay | Admitting: Internal Medicine

## 2020-12-26 ENCOUNTER — Other Ambulatory Visit: Payer: Self-pay

## 2020-12-26 ENCOUNTER — Encounter: Payer: Self-pay | Admitting: Internal Medicine

## 2020-12-26 ENCOUNTER — Ambulatory Visit (INDEPENDENT_AMBULATORY_CARE_PROVIDER_SITE_OTHER): Payer: Medicare Other | Admitting: Internal Medicine

## 2020-12-26 VITALS — BP 110/82 | HR 83 | Temp 97.9°F | Ht 66.0 in | Wt 192.2 lb

## 2020-12-26 DIAGNOSIS — E1165 Type 2 diabetes mellitus with hyperglycemia: Secondary | ICD-10-CM

## 2020-12-26 DIAGNOSIS — R197 Diarrhea, unspecified: Secondary | ICD-10-CM

## 2020-12-26 DIAGNOSIS — R1084 Generalized abdominal pain: Secondary | ICD-10-CM

## 2020-12-26 MED ORDER — MOMETASONE FUROATE 50 MCG/ACT NA SUSP: NASAL | 1 refills | Status: DC

## 2020-12-26 NOTE — Patient Instructions (Signed)
Nausea, Adult Nausea is the feeling that you have an upset stomach or that you are about to vomit. Nausea on its own is not usually a serious concern, but it may be an early sign of a more serious medical problem. As nausea gets worse, it can lead to vomiting. If vomiting develops, or if you are not able to drink enough fluids, you are at risk of becoming dehydrated. Dehydration can make you tired and thirsty, cause you to have a dry mouth, and decrease how often you urinate. Older adults and people with other diseases or a weak disease-fighting system (immune system) are at higher risk for dehydration. The main goals of treating your nausea are:  To relieve your nausea.  To limit repeated nausea episodes.  To prevent vomiting and dehydration. Follow these instructions at home: Watch your symptoms for any changes. Tell your health care provider about them. Follow these instructions as told by your health care provider. Eating and drinking  Take an oral rehydration solution (ORS). This is a drink that is sold at pharmacies and retail stores.  Drink clear fluids slowly and in small amounts as you are able. Clear fluids include water, ice chips, low-calorie sports drinks, and fruit juice that has water added (diluted fruit juice).  Eat bland, easy-to-digest foods in small amounts as you are able. These foods include bananas, applesauce, rice, lean meats, toast, and crackers.  Avoid drinking fluids that contain a lot of sugar or caffeine, such as energy drinks, sports drinks, and soda.  Avoid alcohol.  Avoid spicy or fatty foods.      General instructions  Take over-the-counter and prescription medicines only as told by your health care provider.  Rest at home while you recover.  Drink enough fluid to keep your urine pale yellow.  Breathe slowly and deeply when you feel nauseous.  Avoid smelling things that have strong odors.  Wash your hands often using soap and water. If soap and  water are not available, use hand sanitizer.  Make sure that all people in your household wash their hands well and often.  Keep all follow-up visits as told by your health care provider. This is important. Contact a health care provider if:  Your nausea gets worse.  Your nausea does not go away after two days.  You vomit.  You cannot drink fluids without vomiting.  You have any of the following: ? New symptoms. ? A fever. ? A headache. ? Muscle cramps. ? A rash. ? Pain while urinating.  You feel light-headed or dizzy. Get help right away if:  You have pain in your chest, neck, arm, or jaw.  You feel extremely weak or you faint.  You have vomit that is bright red or looks like coffee grounds.  You have bloody or black stools or stools that look like tar.  You have a severe headache, a stiff neck, or both.  You have severe pain, cramping, or bloating in your abdomen.  You have difficulty breathing or are breathing very quickly.  Your heart is beating very quickly.  Your skin feels cold and clammy.  You feel confused.  You have signs of dehydration, such as: ? Dark urine, very little urine, or no urine. ? Cracked lips. ? Dry mouth. ? Sunken eyes. ? Sleepiness. ? Weakness. These symptoms may represent a serious problem that is an emergency. Do not wait to see if the symptoms will go away. Get medical help right away. Call your local emergency services (  911 in the [place] Do not drive yourself to the hospital. Summary  Nausea is the feeling that you have an upset stomach or that you are about to vomit. Nausea on its own is not usually a serious concern, but it may be an early sign of a more serious medical problem.  If vomiting develops, or if you are not able to drink enough fluids, you are at risk of becoming dehydrated.  Follow recommendations for eating and drinking and take over-the-counter and prescription medicines only as told by your health care  provider.  Contact a health care provider right away if your symptoms worsen or you have new symptoms.  Keep all follow-up visits as told by your health care provider. This is important. This information is not intended to replace advice given to you by your health care provider. Make sure you discuss any questions you have with your health care provider. Document Revised: 06/09/2019 Document Reviewed: 12/17/2017 Elsevier Patient Education  2021 Elsevier Inc.  

## 2020-12-26 NOTE — Progress Notes (Signed)
I,Michele Pope,acting as a Education administrator for Michele Greenland, MD.,have documented all relevant documentation on the behalf of Michele Greenland, MD,as directed by  Michele Greenland, MD while in the presence of Michele Greenland, MD.  This visit occurred during the SARS-CoV-2 public health emergency.  Safety protocols were in place, including screening questions prior to the visit, additional usage of staff PPE, and extensive cleaning of exam room while observing appropriate contact time as indicated for disinfecting solutions.  Subjective:     Patient ID: Michele Pope , female    DOB: [birth year] , 70 y.o.   MRN: [id number]   Chief Complaint  Patient presents with  . Abdominal Pain    HPI  The patient is here today for evaluation of persistent abdominal pain and diarrhea. She was feeling fine at her last visit, her sx had improved with Dexilant. However, her sx have returned. She has been evaluated by GI, Dr. Collene Mares and is scheduled for colonoscopy on July 1st. She would like to change her diabetes meds. Has not identified any specific triggers, thinks sx due to Capac.   Abdominal Pain This is a recurrent problem. The current episode started more than 1 month ago. The onset quality is gradual. The problem occurs intermittently. The problem has been unchanged. The pain is located in the generalized abdominal region. The pain is at a severity of 5/10. The pain is moderate. The quality of the pain is aching. The abdominal pain does not radiate. Associated symptoms include diarrhea and nausea. She has tried proton pump inhibitors for the symptoms.     Past Medical History:  Diagnosis Date  . Diabetes mellitus without complication ([place])   . High cholesterol   . HTN (hypertension)      Family History  Problem Relation Age of Onset  . Heart attack Mother   . Blindness Father      Current Outpatient Medications:  .  Blood Glucose Monitoring Suppl (ONE TOUCH ULTRA 2) w/Device KIT, Use as  directed to check blood sugars 1 time per day dx: e11.22, Disp: 1 each, Rfl: 1 .  Cholecalciferol (VITAMIN D3) 125 MCG (5000 UT) CAPS, Take by mouth., Disp: , Rfl:  .  dexlansoprazole (DEXILANT) 60 MG capsule, 1 cap(s), Disp: , Rfl:  .  ezetimibe (ZETIA) 10 MG tablet, TAKE 1 TABLET BY MOUTH EVERY DAY, Disp: 90 tablet, Rfl: 1 .  glucose blood (ONE TOUCH ULTRA TEST) test strip, Use as instructed to check blood sugars 1 time per day dx: e11.65, Disp: 100 each, Rfl: 11 .  icosapent Ethyl (VASCEPA) 1 g capsule, Take 1 capsule (1 g total) by mouth daily. 1 capsule daily, Disp: 90 capsule, Rfl: 1 .  JANUMET XR 50-1000 MG TB24, TAKE 1 TABLET BY MOUTH TWICE A DAY, Disp: 180 tablet, Rfl: 1 .  levocetirizine (XYZAL) 5 MG tablet, Take 5 mg by mouth every evening., Disp: , Rfl:  .  lisinopril (ZESTRIL) 10 MG tablet, TAKE 1 TABLET BY MOUTH EVERY DAY, Disp: 90 tablet, Rfl: 1 .  loratadine (CLARITIN) 10 MG tablet, TAKE 1 TABLET BY MOUTH EVERY DAY, Disp: 90 tablet, Rfl: 1 .  Misc Natural Products (NEURIVA PO), Take by mouth. 1 per day, Disp: , Rfl:  .  diclofenac sodium (VOLTAREN) 1 % GEL, Apply topically 4 (four) times daily. (Patient not taking: Reported on 12/26/2020), Disp: , Rfl:  .  mometasone (NASONEX) 50 MCG/ACT nasal spray, SPRAY 2 SPRAYS INTO EACH NOSTRIL EVERY DAY, Disp: 51 each, Rfl:  1   Allergies  Allergen Reactions  . Meloxicam Diarrhea  . Pitavastatin      Review of Systems  Constitutional: Negative.   Respiratory: Negative.   Cardiovascular: Negative.   Gastrointestinal: Positive for abdominal pain, diarrhea and nausea.  Neurological: Negative.   Psychiatric/Behavioral: Negative.      Today's Vitals   12/26/20 1109  BP: 110/82  Pulse: 83  Temp: 97.9 F (36.6 C)  TempSrc: Oral  Weight: 192 lb 3.2 oz (87.2 kg)  Height: _0  (1.676 m)  PainSc: 3   PainLoc: Abdomen   Body mass index is 31.02 kg/m.  Wt Readings from Last 3 Encounters:  12/26/20 192 lb 3.2 oz (87.2 kg)  11/07/20  194 lb 12.8 oz (88.4 kg)  07/05/20 198 lb 6.4 oz (90 kg)   BP Readings from Last 3 Encounters:  12/26/20 110/82  11/07/20 114/78  07/05/20 118/68   Objective:  Physical Exam Vitals and nursing note reviewed.  Constitutional:      Appearance: Normal appearance. She is well-developed.  HENT:     Head: Normocephalic and atraumatic.     Mouth/Throat:     Comments: Masked  Cardiovascular:     Rate and Rhythm: Normal rate and regular rhythm.     Heart sounds: Normal heart sounds.  Pulmonary:     Effort: Pulmonary effort is normal.     Breath sounds: Normal breath sounds.  Abdominal:     General: Bowel sounds are normal. There is no distension.     Palpations: Abdomen is soft.     Tenderness: There is no abdominal tenderness.  Skin:    General: Skin is warm.  Neurological:     General: No focal deficit present.     Mental Status: She is alert.  Psychiatric:        Mood and Affect: Mood normal.        Behavior: Behavior normal.         Assessment And Plan:     1. Generalized abdominal pain Comments: I will stop Janumet. She is encouraged to keep upcoming colonoscopy appt. She will c/w Dexilant.  - TSH - Amylase - Lipase  2. Diarrhea, unspecified type Comments: She is encouraged to keep colonoscopy appt. She reports GI did not check labs, I will check labs as listed below. I will be sure to send a copy of these labs. Pt advised the metformin in Janumet may be exacerbating her sx. Again, I will d/c Janumet.  - TSH - Amylase - Lipase  3. Uncontrolled type 2 diabetes mellitus with hyperglycemia ([place]) Comments: I will stop Janumet. She was given samples of Farxiga 19m to take once daily. Pt advised of possible UTI, yeast infection. Encouraged to stay well hydrated. Advised to take with breakfast. She agrees to f/u in four weeks for re-evaluation. I will recheck bmp at that time. All questions were answered to her satisfaction. Cardiac/renal benefits were discussed in detail  as well.    Patient was given opportunity to ask questions. Patient verbalized understanding of the plan and was able to repeat key elements of the plan. All questions were answered to their satisfaction.   I, RMaximino Greenland MD, have reviewed all documentation for this visit. The documentation on 12/26/20 for the exam, diagnosis, procedures, and orders are all accurate and complete.   IF YOU HAVE BEEN REFERRED TO A SPECIALIST, IT MAY TAKE 1-2 WEEKS TO SCHEDULE/PROCESS THE REFERRAL. IF YOU HAVE NOT HEARD FROM US/SPECIALIST IN TWO WEEKS, PLEASE  GIVE [place] A CALL AT [phone].   THE PATIENT IS ENCOURAGED TO PRACTICE SOCIAL DISTANCING DUE TO THE COVID-19 PANDEMIC.

## 2020-12-27 LAB — AMYLASE: Amylase: 57 U/L (ref 31–110)

## 2020-12-27 LAB — LIPASE: Lipase: 41 U/L (ref 14–72)

## 2020-12-27 LAB — TSH: TSH: 1.61 u[IU]/mL (ref 0.450–4.500)

## 2020-12-28 ENCOUNTER — Encounter: Payer: Self-pay | Admitting: Internal Medicine

## 2021-01-04 ENCOUNTER — Encounter: Payer: Self-pay | Admitting: Internal Medicine

## 2021-01-04 MED ORDER — SITAGLIPTIN PHOSPHATE 100 MG PO TABS: 100.0000 mg | ORAL_TABLET | Freq: Every day | ORAL | 1 refills | Status: DC

## 2021-01-13 ENCOUNTER — Other Ambulatory Visit: Payer: Self-pay | Admitting: Internal Medicine

## 2021-01-26 ENCOUNTER — Other Ambulatory Visit: Payer: Self-pay

## 2021-01-26 ENCOUNTER — Encounter: Payer: Self-pay | Admitting: Internal Medicine

## 2021-01-26 ENCOUNTER — Ambulatory Visit (INDEPENDENT_AMBULATORY_CARE_PROVIDER_SITE_OTHER): Payer: Medicare Other | Admitting: Internal Medicine

## 2021-01-26 VITALS — BP 116/80 | HR 94 | Temp 98.1°F | Ht 66.0 in | Wt 195.6 lb

## 2021-01-26 DIAGNOSIS — K591 Functional diarrhea: Secondary | ICD-10-CM | POA: Diagnosis not present

## 2021-01-26 DIAGNOSIS — R197 Diarrhea, unspecified: Secondary | ICD-10-CM

## 2021-01-26 DIAGNOSIS — I10 Essential (primary) hypertension: Secondary | ICD-10-CM | POA: Diagnosis not present

## 2021-01-26 DIAGNOSIS — E1165 Type 2 diabetes mellitus with hyperglycemia: Secondary | ICD-10-CM

## 2021-01-26 NOTE — Patient Instructions (Signed)

## 2021-01-26 NOTE — Progress Notes (Signed)
I,[name],acting as a Education administrator for [name], MD.,have documented all relevant documentation on the behalf of [name], MD,as directed by  [name], MD while in the presence of [name], MD.  This visit occurred during the SARS-CoV-2 public health emergency.  Safety protocols were in place, including screening questions prior to the visit, additional usage of staff PPE, and extensive cleaning of exam room while observing appropriate contact time as indicated for disinfecting solutions.  Subjective:     Patient ID: Michele Pope , female    DOB: Feb 03, 1951 , 70 y.o.   MRN: [id number]   Chief Complaint  Patient presents with   Diabetes   Hypertension     HPI  The patient is here today for a diabetes and HTN f/u.  She was taken off of Janumet last visit due to stomach issues. She was then started on Januvia, but then she stated her BS were elevated. I added Wilder Glade and she states this caused her to be constipated. She therefore, went back on Janumet XR. Reports she has not had any stomach issues.   The patient states her appointment for her eye exam is next Tuesday.  Diabetes She presents for her follow-up diabetic visit. She has type 2 diabetes mellitus. There are no hypoglycemic associated symptoms. Pertinent negatives for diabetes include no blurred vision and no chest pain. There are no hypoglycemic complications. Risk factors for coronary artery disease include diabetes mellitus, dyslipidemia, hypertension and post-menopausal.  Hypertension This is a chronic problem. The current episode started more than 1 year ago. The problem has been gradually improving since onset. The problem is controlled. Pertinent negatives include no blurred vision, chest pain, palpitations or shortness of breath.    Past Medical History:  Diagnosis Date   Diabetes mellitus without complication ([facility])    High cholesterol    HTN (hypertension)      Family History  Problem  Relation Age of Onset   Heart attack Mother    Blindness Father      Current Outpatient Medications:    Blood Glucose Monitoring Suppl (ONE TOUCH ULTRA 2) w/Device KIT, Use as directed to check blood sugars 1 time per day dx: e11.22, Disp: 1 each, Rfl: 1   Cholecalciferol (VITAMIN D3) 125 MCG (5000 UT) CAPS, Take by mouth., Disp: , Rfl:    dexlansoprazole (DEXILANT) 60 MG capsule, 1 cap(s), Disp: , Rfl:    diclofenac sodium (VOLTAREN) 1 % GEL, Apply topically 4 (four) times daily., Disp: , Rfl:    glucose blood (ONE TOUCH ULTRA TEST) test strip, Use as instructed to check blood sugars 1 time per day dx: e11.65, Disp: 100 each, Rfl: 11   icosapent Ethyl (VASCEPA) 1 g capsule, Take 1 capsule (1 g total) by mouth daily. 1 capsule daily, Disp: 90 capsule, Rfl: 1   levocetirizine (XYZAL) 5 MG tablet, Take 5 mg by mouth every evening., Disp: , Rfl:    lisinopril (ZESTRIL) 10 MG tablet, TAKE 1 TABLET BY MOUTH EVERY DAY, Disp: 90 tablet, Rfl: 1   Misc Natural Products (NEURIVA PO), Take by mouth. 1 per day, Disp: , Rfl:    mometasone (NASONEX) 50 MCG/ACT nasal spray, SPRAY 2 SPRAYS INTO EACH NOSTRIL EVERY DAY, Disp: 51 each, Rfl: 1   dapagliflozin propanediol (FARXIGA) 10 MG TABS tablet, Take 1 tablet (10 mg total) by mouth daily before breakfast., Disp: 90 tablet, Rfl: 1   ezetimibe (ZETIA) 10 MG tablet, TAKE 1 TABLET BY MOUTH EVERY  DAY, Disp: 90 tablet, Rfl: 1   JANUMET XR 50-1000 MG TB24, TAKE 1 TABLET BY MOUTH TWICE A DAY, Disp: 180 tablet, Rfl: 1   loratadine (CLARITIN) 10 MG tablet, TAKE 1 TABLET BY MOUTH EVERY DAY, Disp: 90 tablet, Rfl: 1   Allergies  Allergen Reactions   Meloxicam Diarrhea   Pitavastatin      Review of Systems  Constitutional: Negative.   Eyes:  Negative for blurred vision.  Respiratory: Negative.  Negative for shortness of breath.   Cardiovascular: Negative.  Negative for chest pain and palpitations.  Gastrointestinal: Negative.        On further evaluation of her  stomach issues, she admits to drinking some diet drinks. She has noticed that when she doesn't drink as much, her sx improve.   Psychiatric/Behavioral: Negative.    All other systems reviewed and are negative.   Today's Vitals   01/26/21 1559  BP: 116/80  Pulse: 94  Temp: 98.1 F (36.7 C)  TempSrc: Oral  Weight: 195 lb 9.6 oz (88.7 kg)  Height: $Remove'5\' 6"'KOMKKWA$  (1.676 m)  PainSc: 0-No pain   Body mass index is 31.57 kg/m.  Wt Readings from Last 3 Encounters:  01/26/21 195 lb 9.6 oz (88.7 kg)  12/26/20 192 lb 3.2 oz (87.2 kg)  11/07/20 194 lb 12.8 oz (88.4 kg)    BP Readings from Last 3 Encounters:  01/26/21 116/80  12/26/20 110/82  11/07/20 114/78    Objective:  Physical Exam Vitals and nursing note reviewed.  Constitutional:      Appearance: Normal appearance.  HENT:     Head: Normocephalic and atraumatic.     Nose:     Comments: Masked     Mouth/Throat:     Comments: Masked  Cardiovascular:     Rate and Rhythm: Normal rate and regular rhythm.     Heart sounds: Normal heart sounds.  Pulmonary:     Effort: Pulmonary effort is normal.     Breath sounds: Normal breath sounds.  Musculoskeletal:     Cervical back: Normal range of motion.  Skin:    General: Skin is warm.  Neurological:     General: No focal deficit present.     Mental Status: She is alert.  Psychiatric:        Mood and Affect: Mood normal.        Behavior: Behavior normal.        Assessment And Plan:     1. Uncontrolled type 2 diabetes mellitus with hyperglycemia ([place]) Comments: She is now taking Janumet once daily. Advised to resume Farxiga in the mornings.  - BMP8+EGFR - Hemoglobin A1c  2. Essential hypertension, benign Comments: Chronic, well controlled. Advised to follow low sodium diet.   3. Functional diarrhea Comments: Likely related to artificial sweeteners. She is encouraged to use these products sparingly.     Patient was given opportunity to ask questions. Patient verbalized  understanding of the plan and was able to repeat key elements of the plan. All questions were answered to their satisfaction.  [name], MD   I, [name], MD, have reviewed all documentation for this visit. The documentation on 02/11/21 for the exam, diagnosis, procedures, and orders are all accurate and complete.   IF YOU HAVE BEEN REFERRED TO A SPECIALIST, IT MAY TAKE 1-2 WEEKS TO SCHEDULE/PROCESS THE REFERRAL. IF YOU HAVE NOT HEARD FROM US/SPECIALIST IN TWO WEEKS, PLEASE GIVE [place] A CALL AT [phone].   THE PATIENT IS  ENCOURAGED TO PRACTICE SOCIAL DISTANCING DUE TO THE COVID-19 PANDEMIC.

## 2021-01-27 LAB — BMP8+EGFR
BUN/Creatinine Ratio: 13 (ref 12–28)
BUN: 12 mg/dL (ref 8–27)
CO2: 25 mmol/L (ref 20–29)
Calcium: 10.2 mg/dL (ref 8.7–10.3)
Chloride: 100 mmol/L (ref 96–106)
Creatinine, Ser: 0.9 mg/dL (ref 0.57–1.00)
Glucose: 84 mg/dL (ref 65–99)
Potassium: 4.7 mmol/L (ref 3.5–5.2)
Sodium: 139 mmol/L (ref 134–144)
eGFR: 70 mL/min/{1.73_m2} (ref >59)

## 2021-01-27 LAB — HEMOGLOBIN A1C
Est. average glucose Bld gHb Est-mCnc: 126 mg/dL
Hgb A1c MFr Bld: 6 % — ABNORMAL HIGH (ref 4.8–5.6)

## 2021-01-30 ENCOUNTER — Other Ambulatory Visit: Payer: Self-pay | Admitting: Internal Medicine

## 2021-01-31 LAB — HM DIABETES EYE EXAM

## 2021-02-01 ENCOUNTER — Other Ambulatory Visit: Payer: Self-pay | Admitting: Internal Medicine

## 2021-02-02 ENCOUNTER — Other Ambulatory Visit: Payer: Self-pay

## 2021-02-02 ENCOUNTER — Encounter: Payer: Self-pay | Admitting: Internal Medicine

## 2021-02-02 MED ORDER — DAPAGLIFLOZIN PROPANEDIOL 10 MG PO TABS: 10.0000 mg | ORAL_TABLET | Freq: Every day | ORAL | 1 refills | Status: DC

## 2021-02-03 ENCOUNTER — Encounter: Payer: Self-pay | Admitting: Internal Medicine

## 2021-02-19 ENCOUNTER — Other Ambulatory Visit: Payer: Self-pay | Admitting: Internal Medicine

## 2021-02-21 ENCOUNTER — Encounter: Payer: Self-pay | Admitting: Internal Medicine

## 2021-03-02 ENCOUNTER — Ambulatory Visit: Payer: Medicare Other

## 2021-03-02 ENCOUNTER — Encounter: Payer: Medicare Other | Admitting: Internal Medicine

## 2021-03-22 ENCOUNTER — Ambulatory Visit: Payer: Medicare Other

## 2021-04-13 ENCOUNTER — Ambulatory Visit (INDEPENDENT_AMBULATORY_CARE_PROVIDER_SITE_OTHER): Payer: Medicare Other

## 2021-04-13 VITALS — Ht 66.0 in | Wt 190.0 lb

## 2021-04-13 DIAGNOSIS — Z0000 Encounter for general adult medical examination without abnormal findings: Secondary | ICD-10-CM

## 2021-04-13 NOTE — Patient Instructions (Signed)
Michele Pope , Thank you for taking time to come for your Medicare Wellness Visit. I appreciate your ongoing commitment to your health goals. Please review the following plan we discussed and let me know if I can assist you in the future.   Screening recommendations/referrals: Colonoscopy: scheduled 05/01/2021 Mammogram: completed 11/18/2020 Bone Density: completed 05/08/2019 Recommended yearly ophthalmology/optometry visit for glaucoma screening and checkup Recommended yearly dental visit for hygiene and checkup  Vaccinations: Influenza vaccine: completed 04/13/2021 Pneumococcal vaccine: completed 02/17/2019 Tdap vaccine: completed 09/15/2018, due 09/15/2028 Shingles vaccine: completed   Covid-19:09/21/2020, 05/25/2020, 09/18/2019, 08/24/2019  Advanced directives: Advance directive discussed with you today.   Conditions/risks identified: none  Next appointment: Follow up in one year for your annual wellness visit    Preventive Care 65 Years and Older, Female Preventive care refers to lifestyle choices and visits with your health care provider that can promote health and wellness. What does preventive care include? A yearly physical exam. This is also called an annual well check. Dental exams once or twice a year. Routine eye exams. Ask your health care provider how often you should have your eyes checked. Personal lifestyle choices, including: Daily care of your teeth and gums. Regular physical activity. Eating a healthy diet. Avoiding tobacco and drug use. Limiting alcohol use. Practicing safe sex. Taking low-dose aspirin every day. Taking vitamin and mineral supplements as recommended by your health care provider. What happens during an annual well check? The services and screenings done by your health care provider during your annual well check will depend on your age, overall health, lifestyle risk factors, and family history of disease. Counseling  Your health care provider may  ask you questions about your: Alcohol use. Tobacco use. Drug use. Emotional well-being. Home and relationship well-being. Sexual activity. Eating habits. History of falls. Memory and ability to understand (cognition). Work and work Astronomer. Reproductive health. Screening  You may have the following tests or measurements: Height, weight, and BMI. Blood pressure. Lipid and cholesterol levels. These may be checked every 5 years, or more frequently if you are over 2 years old. Skin check. Lung cancer screening. You may have this screening every year starting at age 76 if you have a 30-pack-year history of smoking and currently smoke or have quit within the past 15 years. Fecal occult blood test (FOBT) of the stool. You may have this test every year starting at age 36. Flexible sigmoidoscopy or colonoscopy. You may have a sigmoidoscopy every 5 years or a colonoscopy every 10 years starting at age 4. Hepatitis C blood test. Hepatitis B blood test. Sexually transmitted disease (STD) testing. Diabetes screening. This is done by checking your blood sugar (glucose) after you have not eaten for a while (fasting). You may have this done every 1-3 years. Bone density scan. This is done to screen for osteoporosis. You may have this done starting at age 66. Mammogram. This may be done every 1-2 years. Talk to your health care provider about how often you should have regular mammograms. Talk with your health care provider about your test results, treatment options, and if necessary, the need for more tests. Vaccines  Your health care provider may recommend certain vaccines, such as: Influenza vaccine. This is recommended every year. Tetanus, diphtheria, and acellular pertussis (Tdap, Td) vaccine. You may need a Td booster every 10 years. Zoster vaccine. You may need this after age 87. Pneumococcal 13-valent conjugate (PCV13) vaccine. One dose is recommended after age 41. Pneumococcal  polysaccharide (PPSV23) vaccine. One  dose is recommended after age 87. Talk to your health care provider about which screenings and vaccines you need and how often you need them. This information is not intended to replace advice given to you by your health care provider. Make sure you discuss any questions you have with your health care provider. Document Released: 08/05/2015 Document Revised: 03/28/2016 Document Reviewed: 05/10/2015 Elsevier Interactive Patient Education  2017 [employer] Prevention in the Home Falls can cause injuries. They can happen to people of all ages. There are many things you can do to make your home safe and to help prevent falls. What can I do on the outside of my home? Regularly fix the edges of walkways and driveways and fix any cracks. Remove anything that might make you trip as you walk through a door, such as a raised step or threshold. Trim any bushes or trees on the path to your home. Use bright outdoor lighting. Clear any walking paths of anything that might make someone trip, such as rocks or tools. Regularly check to see if handrails are loose or broken. Make sure that both sides of any steps have handrails. Any raised decks and porches should have guardrails on the edges. Have any leaves, snow, or ice cleared regularly. Use sand or salt on walking paths during winter. Clean up any spills in your garage right away. This includes oil or grease spills. What can I do in the bathroom? Use night lights. Install grab bars by the toilet and in the tub and shower. Do not use towel bars as grab bars. Use non-skid mats or decals in the tub or shower. If you need to sit down in the shower, use a plastic, non-slip stool. Keep the floor dry. Clean up any water that spills on the floor as soon as it happens. Remove soap buildup in the tub or shower regularly. Attach bath mats securely with double-sided non-slip rug tape. Do not have throw rugs and other  things on the floor that can make you trip. What can I do in the bedroom? Use night lights. Make sure that you have a light by your bed that is easy to reach. Do not use any sheets or blankets that are too big for your bed. They should not hang down onto the floor. Have a firm chair that has side arms. You can use this for support while you get dressed. Do not have throw rugs and other things on the floor that can make you trip. What can I do in the kitchen? Clean up any spills right away. Avoid walking on wet floors. Keep items that you use a lot in easy-to-reach places. If you need to reach something above you, use a strong step stool that has a grab bar. Keep electrical cords out of the way. Do not use floor polish or wax that makes floors slippery. If you must use wax, use non-skid floor wax. Do not have throw rugs and other things on the floor that can make you trip. What can I do with my stairs? Do not leave any items on the stairs. Make sure that there are handrails on both sides of the stairs and use them. Fix handrails that are broken or loose. Make sure that handrails are as long as the stairways. Check any carpeting to make sure that it is firmly attached to the stairs. Fix any carpet that is loose or worn. Avoid having throw rugs at the top or bottom of the stairs.  If you do have throw rugs, attach them to the floor with carpet tape. Make sure that you have a light switch at the top of the stairs and the bottom of the stairs. If you do not have them, ask someone to add them for you. What else can I do to help prevent falls? Wear shoes that: Do not have high heels. Have rubber bottoms. Are comfortable and fit you well. Are closed at the toe. Do not wear sandals. If you use a stepladder: Make sure that it is fully opened. Do not climb a closed stepladder. Make sure that both sides of the stepladder are locked into place. Ask someone to hold it for you, if possible. Clearly  mark and make sure that you can see: Any grab bars or handrails. First and last steps. Where the edge of each step is. Use tools that help you move around (mobility aids) if they are needed. These include: Canes. Walkers. Scooters. Crutches. Turn on the lights when you go into a dark area. Replace any light bulbs as soon as they burn out. Set up your furniture so you have a clear path. Avoid moving your furniture around. If any of your floors are uneven, fix them. If there are any pets around you, be aware of where they are. Review your medicines with your doctor. Some medicines can make you feel dizzy. This can increase your chance of falling. Ask your doctor what other things that you can do to help prevent falls. This information is not intended to replace advice given to you by your health care provider. Make sure you discuss any questions you have with your health care provider. Document Released: 05/05/2009 Document Revised: 12/15/2015 Document Reviewed: 08/13/2014 Elsevier Interactive Patient Education  2017 [employer].

## 2021-04-13 NOTE — Progress Notes (Signed)
I connected with Michele Pope today by telephone and verified that I am speaking with the correct person using two identifiers. Location patient: home Location provider: work Persons participating in the virtual visit: Michele Pope, Glenna Durand LPN.   I discussed the limitations, risks, security and privacy concerns of performing an evaluation and management service by telephone and the availability of in person appointments. I also discussed with the patient that there may be a patient responsible charge related to this service. The patient expressed understanding and verbally consented to this telephonic visit.    Interactive audio and video telecommunications were attempted between this provider and patient, however failed, due to patient having technical difficulties OR patient did not have access to video capability.  We continued and completed visit with audio only.     Vital signs may be patient reported or missing.  Subjective:   Michele Pope is a 70 y.o. female who presents for Medicare Annual (Subsequent) preventive examination.  Review of Systems     Cardiac Risk Factors include: advanced age (>36mn, >>3women);hypertension;obesity (BMI >30kg/m2);sedentary lifestyle     Objective:    Today's Vitals   04/13/21 1156  Weight: 190 lb (86.2 kg)  Height: 5' 6" (1.676 m)   Body mass index is 30.67 kg/m.  Advanced Directives 04/13/2021 02/24/2020 02/17/2019  Does Patient Have a Medical Advance Directive? No No No  Would patient like information on creating a medical advance directive? - Yes (MAU/Ambulatory/Procedural Areas - Information given) No - Patient declined    Current Medications (verified) Outpatient Encounter Medications as of 04/13/2021  Medication Sig   Blood Glucose Monitoring Suppl (ONE TOUCH ULTRA 2) w/Device KIT Use as directed to check blood sugars 1 time per day dx: e11.22   Cholecalciferol (VITAMIN D3) 125 MCG (5000 UT) CAPS Take by mouth.    dapagliflozin propanediol (FARXIGA) 10 MG TABS tablet Take 1 tablet (10 mg total) by mouth daily before breakfast.   dexlansoprazole (DEXILANT) 60 MG capsule 1 cap(s)   diclofenac sodium (VOLTAREN) 1 % GEL Apply topically 4 (four) times daily.   ezetimibe (ZETIA) 10 MG tablet TAKE 1 TABLET BY MOUTH EVERY DAY   glucose blood (ONE TOUCH ULTRA TEST) test strip Use as instructed to check blood sugars 1 time per day dx: e11.65   icosapent Ethyl (VASCEPA) 1 g capsule Take 1 capsule (1 g total) by mouth daily. 1 capsule daily   JANUMET XR 50-1000 MG TB24 TAKE 1 TABLET BY MOUTH TWICE A DAY   levocetirizine (XYZAL) 5 MG tablet Take 5 mg by mouth every evening.   lisinopril (ZESTRIL) 10 MG tablet TAKE 1 TABLET BY MOUTH EVERY DAY   loratadine (CLARITIN) 10 MG tablet TAKE 1 TABLET BY MOUTH EVERY DAY   Misc Natural Products (NEURIVA PO) Take by mouth. 1 per day   mometasone (NASONEX) 50 MCG/ACT nasal spray SPRAY 2 SPRAYS INTO EACH NOSTRIL EVERY DAY   No facility-administered encounter medications on file as of 04/13/2021.    Allergies (verified) Meloxicam and Pitavastatin   History: Past Medical History:  Diagnosis Date   Diabetes mellitus without complication (HCC)    High cholesterol    HTN (hypertension)    Past Surgical History:  Procedure Laterality Date   BREAST EXCISIONAL BIOPSY Right    SHOULDER ARTHROSCOPY W/ ROTATOR CUFF REPAIR  08/22/2018   TOTAL ABDOMINAL HYSTERECTOMY  1995   Family History  Problem Relation Age of Onset   Heart attack Mother    Blindness Father  Social History   Socioeconomic History   Marital status: Married    Spouse name: Not on file   Number of children: Not on file   Years of education: Not on file   Highest education level: Not on file  Occupational History   Not on file  Tobacco Use   Smoking status: Never   Smokeless tobacco: Never  Vaping Use   Vaping Use: Never used  Substance and Sexual Activity   Alcohol use: Never   Drug use: Never    Sexual activity: Yes  Other Topics Concern   Not on file  Social History Narrative   Not on file   Social Determinants of Health   Financial Resource Strain: Low Risk    Difficulty of Paying Living Expenses: Not hard at all  Food Insecurity: No Food Insecurity   Worried About Charity fundraiser in the Last Year: Never true   [place] in the Last Year: Never true  Transportation Needs: No Transportation Needs   Lack of Transportation (Medical): No   Lack of Transportation (Non-Medical): No  Physical Activity: Inactive   Days of Exercise per Week: 0 days   Minutes of Exercise per Session: 0 min  Stress: No Stress Concern Present   Feeling of Stress : Not at all  Social Connections: Not on file    Tobacco Counseling Counseling given: Not Answered   Clinical Intake:  Pre-visit preparation completed: Yes  Pain : No/denies pain     Nutritional Status: BMI > 30  Obese Nutritional Risks: None Diabetes: Yes  How often do you need to have someone help you when you read instructions, pamphlets, or other written materials from your doctor or pharmacy?: 1 - Never What is the last grade level you completed in school?: associates degree  Diabetic? Yes Nutrition Risk Assessment:  Has the patient had any N/V/D within the last 2 months?  No  Does the patient have any non-healing wounds?  No  Has the patient had any unintentional weight loss or weight gain?  No   Diabetes:  Is the patient diabetic?  Yes  If diabetic, was a CBG obtained today?  No  Did the patient bring in their glucometer from home?  No  How often do you monitor your CBG's? Every 2-3 days.   Financial Strains and Diabetes Management:  Are you having any financial strains with the device, your supplies or your medication? No .  Does the patient want to be seen by Chronic Care Management for management of their diabetes?  No  Would the patient like to be referred to a Nutritionist or for Diabetic  Management?  No   Diabetic Exams:  Diabetic Eye Exam: Completed 01/31/2021 Diabetic Foot Exam: Completed 02/24/2020   Interpreter Needed?: No  Information entered by :: NAllen LPN   Activities of Daily Living In your present state of health, do you have any difficulty performing the following activities: 04/13/2021  Hearing? N  Vision? N  Difficulty concentrating or making decisions? N  Walking or climbing stairs? N  Dressing or bathing? N  Doing errands, shopping? N  Preparing Food and eating ? N  Using the Toilet? N  In the past six months, have you accidently leaked urine? N  Do you have problems with loss of bowel control? N  Managing your Medications? N  Managing your Finances? N  Housekeeping or managing your Housekeeping? N  Some recent data might be hidden    Patient  Care Team: Glendale Chard, MD as PCP - General (Internal Medicine)  Indicate any recent Medical Services you may have received from other than [facility] providers in the past year (date may be approximate).     Assessment:   This is a routine wellness examination for [place].  Hearing/Vision screen Vision Screening - Comments:: Regular eye exams, [facility]  Dietary issues and exercise activities discussed: Current Exercise Habits: The patient does not participate in regular exercise at present   Goals Addressed             This Visit's Progress    Patient Stated       04/13/2021, eat healthier       Depression Screen PHQ 2/9 Scores 04/13/2021 02/24/2020 02/23/2020 02/17/2019 11/11/2018 09/15/2018 06/27/2018  PHQ - 2 Score 0 0 0 0 0 0 0  PHQ- 9 Score - 1 - - - - -    Fall Risk Fall Risk  04/13/2021 02/24/2020 02/23/2020 12/10/2018 11/11/2018  Falls in the past year? 0 _0 0  Comment - slipped off ladder - - -  Number falls in past yr: - 0 0 0 -  Injury with Fall? - 0 0 1 -  Comment - - fell off of a ladder because it had a broken leg. - -  Risk for fall due to : Medication side effect History of  fall(s);Medication side effect - - -  Follow up Falls evaluation completed;Education provided;Falls prevention discussed - - - -    FALL RISK PREVENTION PERTAINING TO THE HOME:  Any stairs in or around the home? Yes  If so, are there any without handrails? No  Home free of loose throw rugs in walkways, pet beds, electrical cords, etc? Yes  Adequate lighting in your home to reduce risk of falls? Yes   ASSISTIVE DEVICES UTILIZED TO PREVENT FALLS:  Life alert? No  Use of a cane, walker or w/c? No  Grab bars in the bathroom? Yes  Shower chair or bench in shower? No  Elevated toilet seat or a handicapped toilet? Yes   TIMED UP AND GO:  Was the test performed? No .      Cognitive Function:     6CIT Screen 04/13/2021 02/24/2020 02/17/2019  What Year? 0 points 0 points 0 points  What month? 0 points 0 points 0 points  What time? 0 points 0 points 0 points  Count back from 20 0 points 2 points 0 points  Months in reverse 0 points 0 points 0 points  Repeat phrase 2 points 4 points 0 points  Total Score 2 6 0    Immunizations Immunization History  Administered Date(s) Administered   Influenza, High Dose Seasonal PF 05/14/2018   Influenza,inj,Quad PF,6+ Mos 04/16/2019   Influenza,inj,quad, With Preservative 04/22/2018   Influenza-Unspecified 04/20/2019, 04/25/2020, 04/13/2021   Moderna Sars-Covid-2 Vaccination 08/24/2019, 09/18/2019, 05/25/2020, 09/21/2020   Pneumococcal Polysaccharide-23 05/03/2009, 02/17/2019   Tdap 09/15/2018   Zoster Recombinat (Shingrix) 02/26/2020    TDAP status: Up to date  Flu Vaccine status: Up to date  Pneumococcal vaccine status: Up to date  Covid-19 vaccine status: Completed vaccines  Qualifies for Shingles Vaccine? Yes   Zostavax completed No   Shingrix Completed?: Yes  Screening Tests Health Maintenance  Topic Date Due   Zoster Vaccines- Shingrix (2 of 2) 04/22/2020   COVID-19 Vaccine (5 - Booster for Moderna series) 01/21/2021    FOOT EXAM  02/23/2021   HEMOGLOBIN A1C  07/29/2021   OPHTHALMOLOGY  EXAM  01/31/2022   MAMMOGRAM  11/19/2022   TETANUS/TDAP  09/15/2028   COLONOSCOPY (Pts 45-30yr Insurance coverage will need to be confirmed)  12/01/2030   INFLUENZA VACCINE  Completed   DEXA SCAN  Completed   Hepatitis C Screening  Completed   HPV VACCINES  Aged Out    Health Maintenance  Health Maintenance Due  Topic Date Due   Zoster Vaccines- Shingrix (2 of 2) 04/22/2020   COVID-19 Vaccine (5 - Booster for Moderna series) 01/21/2021   FOOT EXAM  02/23/2021    Colorectal cancer screening: Type of screening: Colonoscopy. Completed 01/13/2016. Repeat every 10 years  Mammogram status: Completed 11/18/2020. Repeat every year  Bone Density status: Completed 05/08/2019.   Lung Cancer Screening: (Low Dose CT Chest recommended if Age 68-80years, 30 pack-year currently smoking OR have quit w/in 15years.) does not qualify.   Lung Cancer Screening Referral: no  Additional Screening:  Hepatitis C Screening: does qualify; Completed 08/16/2012  Vision Screening: Recommended annual ophthalmology exams for early detection of glaucoma and other disorders of the eye. Is the patient up to date with their annual eye exam?  Yes  Who is the provider or what is the name of the office in which the patient attends annual eye exams? [facility] If pt is not established with a provider, would they like to be referred to a provider to establish care? No .   Dental Screening: Recommended annual dental exams for proper oral hygiene  Community Resource Referral / Chronic Care Management: CRR required this visit?  No   CCM required this visit?  No      Plan:     I have personally reviewed and noted the following in the patient's chart:   Medical and social history Use of alcohol, tobacco or illicit drugs  Current medications and supplements including opioid prescriptions.  Functional ability and status Nutritional  status Physical activity Advanced directives List of other physicians Hospitalizations, surgeries, and ER visits in previous 12 months Vitals Screenings to include cognitive, depression, and falls Referrals and appointments  In addition, I have reviewed and discussed with patient certain preventive protocols, quality metrics, and best practice recommendations. A written personalized care plan for preventive services as well as general preventive health recommendations were provided to patient.     NKellie Simmering LPN   [id number]  Nurse Notes:

## 2021-05-01 LAB — HM COLONOSCOPY

## 2021-05-02 ENCOUNTER — Encounter: Payer: Self-pay | Admitting: Internal Medicine

## 2021-05-08 ENCOUNTER — Ambulatory Visit (INDEPENDENT_AMBULATORY_CARE_PROVIDER_SITE_OTHER): Payer: Medicare Other | Admitting: Internal Medicine

## 2021-05-08 ENCOUNTER — Encounter: Payer: Self-pay | Admitting: Internal Medicine

## 2021-05-08 ENCOUNTER — Other Ambulatory Visit: Payer: Self-pay

## 2021-05-08 VITALS — BP 120/76 | HR 88 | Temp 98.5°F | Ht 66.0 in | Wt 193.0 lb

## 2021-05-08 DIAGNOSIS — Z0000 Encounter for general adult medical examination without abnormal findings: Secondary | ICD-10-CM

## 2021-05-08 DIAGNOSIS — E1165 Type 2 diabetes mellitus with hyperglycemia: Secondary | ICD-10-CM

## 2021-05-08 DIAGNOSIS — E6609 Other obesity due to excess calories: Secondary | ICD-10-CM

## 2021-05-08 DIAGNOSIS — M25572 Pain in left ankle and joints of left foot: Secondary | ICD-10-CM | POA: Diagnosis not present

## 2021-05-08 DIAGNOSIS — I10 Essential (primary) hypertension: Secondary | ICD-10-CM

## 2021-05-08 DIAGNOSIS — Z0001 Encounter for general adult medical examination with abnormal findings: Secondary | ICD-10-CM

## 2021-05-08 DIAGNOSIS — Z6831 Body mass index (BMI) 31.0-31.9, adult: Secondary | ICD-10-CM

## 2021-05-08 LAB — POCT UA - MICROALBUMIN
Albumin/Creatinine Ratio, Urine, POC: <30
Creatinine, POC: 200 mg/dL
Microalbumin Ur, POC: 10 mg/L

## 2021-05-08 LAB — POCT URINALYSIS DIPSTICK
Bilirubin, UA: NEGATIVE
Glucose, UA: POSITIVE — AB
Ketones, UA: NEGATIVE
Leukocytes, UA: NEGATIVE
Nitrite, UA: NEGATIVE
Protein, UA: NEGATIVE
Spec Grav, UA: 1.02 (ref 1.010–1.025)
Urobilinogen, UA: 0.2 E.U./dL
pH, UA: 5.5 (ref 5.0–8.0)

## 2021-05-08 MED ORDER — FLUCONAZOLE 150 MG PO TABS: ORAL_TABLET | ORAL | 0 refills | Status: DC

## 2021-05-08 NOTE — Patient Instructions (Signed)
Health Maintenance After Age 68 After age 68, you are at a higher risk for certain long-term diseases and infections as well as injuries from falls. Falls are a major cause of broken bones and head injuries in people who are older than age 68. Getting regular preventive care can help to keep you healthy and well. Preventive care includes getting regular testing and making lifestyle changes as recommended by your health care provider. Talk with your health care provider about: Which screenings and tests you should have. A screening is a test that checks for a disease when you have no symptoms. A diet and exercise plan that is right for you. What should I know about screenings and tests to prevent falls? Screening and testing are the best ways to find a health problem early. Early diagnosis and treatment give you the best chance of managing medical conditions that are common after age 68. Certain conditions and lifestyle choices may make you more likely to have a fall. Your health care provider may recommend: Regular vision checks. Poor vision and conditions such as cataracts can make you more likely to have a fall. If you wear glasses, make sure to get your prescription updated if your vision changes. Medicine review. Work with your health care provider to regularly review all of the medicines you are taking, including over-the-counter medicines. Ask your health care provider about any side effects that may make you more likely to have a fall. Tell your health care provider if any medicines that you take make you feel dizzy or sleepy. Osteoporosis screening. Osteoporosis is a condition that causes the bones to get weaker. This can make the bones weak and cause them to break more easily. Blood pressure screening. Blood pressure changes and medicines to control blood pressure can make you feel dizzy. Strength and balance checks. Your health care provider may recommend certain tests to check your strength and  balance while standing, walking, or changing positions. Foot health exam. Foot pain and numbness, as well as not wearing proper footwear, can make you more likely to have a fall. Depression screening. You may be more likely to have a fall if you have a fear of falling, feel emotionally low, or feel unable to do activities that you used to do. Alcohol use screening. Using too much alcohol can affect your balance and may make you more likely to have a fall. What actions can I take to lower my risk of falls? General instructions Talk with your health care provider about your risks for falling. Tell your health care provider if: You fall. Be sure to tell your health care provider about all falls, even ones that seem minor. You feel dizzy, sleepy, or off-balance. Take over-the-counter and prescription medicines only as told by your health care provider. These include any supplements. Eat a healthy diet and maintain a healthy weight. A healthy diet includes low-fat dairy products, low-fat (lean) meats, and fiber from whole grains, beans, and lots of fruits and vegetables. Home safety Remove any tripping hazards, such as rugs, cords, and clutter. Install safety equipment such as grab bars in bathrooms and safety rails on stairs. Keep rooms and walkways well-lit. Activity  Follow a regular exercise program to stay fit. This will help you maintain your balance. Ask your health care provider what types of exercise are appropriate for you. If you need a cane or walker, use it as recommended by your health care provider. Wear supportive shoes that have nonskid soles. Lifestyle Do not   drink alcohol if your health care provider tells you not to drink. If you drink alcohol, limit how much you have: 0-1 drink a day for women. 0-2 drinks a day for men. Be aware of how much alcohol is in your drink. In the [place] one drink equals one typical bottle of beer (12 oz), one-half glass of wine (5 oz), or one shot of  hard liquor (1 oz). Do not use any products that contain nicotine or tobacco, such as cigarettes and e-cigarettes. If you need help quitting, ask your health care provider. Summary Having a healthy lifestyle and getting preventive care can help to protect your health and wellness after age 68. Screening and testing are the best way to find a health problem early and help you avoid having a fall. Early diagnosis and treatment give you the best chance for managing medical conditions that are more common for people who are older than age 68. Falls are a major cause of broken bones and head injuries in people who are older than age 68. Take precautions to prevent a fall at home. Work with your health care provider to learn what changes you can make to improve your health and wellness and to prevent falls. This information is not intended to replace advice given to you by your health care provider. Make sure you discuss any questions you have with your health care provider. Document Revised: 09/16/2020 Document Reviewed: 06/24/2020 Elsevier Patient Education  2022 [employer] Inc.  

## 2021-05-08 NOTE — Progress Notes (Signed)
I,Michele Pope,acting as a Education administrator for Michele Greenland, MD.,have documented all relevant documentation on the behalf of Michele Greenland, MD,as directed by  Michele Greenland, MD while in the presence of Michele Greenland, MD.  This visit occurred during the SARS-CoV-2 public health emergency.  Safety protocols were in place, including screening questions prior to the visit, additional usage of staff PPE, and extensive cleaning of exam room while observing appropriate contact time as indicated for disinfecting solutions.  Subjective:     Patient ID: Michele Pope , female    DOB: 21-May-1951 , 70 y.o.   MRN: [id number]   Chief Complaint  Patient presents with   Annual Exam   Diabetes   Hypertension    HPI  The patient is here today for a physical examination.  She is no longer followed by GYN for pelvic exams. She has no specific concerns or complaints at this time.   Diabetes She presents for her follow-up diabetic visit. She has type 2 diabetes mellitus. There are no hypoglycemic associated symptoms. Pertinent negatives for diabetes include no blurred vision and no chest pain. There are no hypoglycemic complications. Risk factors for coronary artery disease include diabetes mellitus, dyslipidemia, hypertension and post-menopausal. She is following a diabetic diet. She participates in exercise intermittently. There is no change in her home blood glucose trend. An ACE inhibitor/angiotensin II receptor blocker is being taken.  Hypertension This is a chronic problem. The current episode started more than 1 year ago. The problem has been gradually improving since onset. The problem is controlled. Pertinent negatives include no blurred vision, chest pain, palpitations or shortness of breath. The current treatment provides moderate improvement.    Past Medical History:  Diagnosis Date   Diabetes mellitus without complication (HCC)    High cholesterol    HTN (hypertension)      Family History   Problem Relation Age of Onset   Heart attack Mother    Blindness Father      Current Outpatient Medications:    Blood Glucose Monitoring Suppl (ONE TOUCH ULTRA 2) w/Device KIT, Use as directed to check blood sugars 1 time per day dx: e11.22, Disp: 1 each, Rfl: 1   Cholecalciferol (VITAMIN D3) 125 MCG (5000 UT) CAPS, Take by mouth., Disp: , Rfl:    dapagliflozin propanediol (FARXIGA) 10 MG TABS tablet, Take 1 tablet (10 mg total) by mouth daily before breakfast., Disp: 90 tablet, Rfl: 1   dexlansoprazole (DEXILANT) 60 MG capsule, 1 cap(s), Disp: , Rfl:    diclofenac sodium (VOLTAREN) 1 % GEL, Apply topically 4 (four) times daily., Disp: , Rfl:    ezetimibe (ZETIA) 10 MG tablet, TAKE 1 TABLET BY MOUTH EVERY DAY, Disp: 90 tablet, Rfl: 1   fluconazole (DIFLUCAN) 150 MG tablet, Take one tab po today, repeat in 2 days, Disp: 2 tablet, Rfl: 0   glucose blood (ONE TOUCH ULTRA TEST) test strip, Use as instructed to check blood sugars 1 time per day dx: e11.65, Disp: 100 each, Rfl: 11   icosapent Ethyl (VASCEPA) 1 g capsule, Take 1 capsule (1 g total) by mouth daily. 1 capsule daily, Disp: 90 capsule, Rfl: 1   JANUMET XR 50-1000 MG TB24, TAKE 1 TABLET BY MOUTH TWICE A DAY, Disp: 180 tablet, Rfl: 1   levocetirizine (XYZAL) 5 MG tablet, Take 5 mg by mouth every evening., Disp: , Rfl:    lisinopril (ZESTRIL) 10 MG tablet, TAKE 1 TABLET BY MOUTH EVERY DAY, Disp: 90 tablet, Rfl: 1  loratadine (CLARITIN) 10 MG tablet, TAKE 1 TABLET BY MOUTH EVERY DAY, Disp: 90 tablet, Rfl: 1   Misc Natural Products (NEURIVA PO), Take by mouth. 1 per day, Disp: , Rfl:    mometasone (NASONEX) 50 MCG/ACT nasal spray, SPRAY 2 SPRAYS INTO EACH NOSTRIL EVERY DAY, Disp: 51 each, Rfl: 1   Allergies  Allergen Reactions   Meloxicam Diarrhea   Pitavastatin       The patient states she uses post menopausal status for birth control. Last LMP was No LMP recorded. Patient is postmenopausal.. Negative for Dysmenorrhea. Negative  for: breast discharge, breast lump(s), breast pain and breast self exam. Associated symptoms include abnormal vaginal bleeding. Pertinent negatives include abnormal bleeding (hematology), anxiety, decreased libido, depression, difficulty falling sleep, dyspareunia, history of infertility, nocturia, sexual dysfunction, sleep disturbances, urinary incontinence, urinary urgency, vaginal discharge and vaginal itching. Diet regular.The patient states her exercise level is  intermittent.  . The patient's tobacco use is:  Social History   Tobacco Use  Smoking Status Never  Smokeless Tobacco Never  . She has been exposed to passive smoke. The patient's alcohol use is:  Social History   Substance and Sexual Activity  Alcohol Use Never    Review of Systems  Constitutional: Negative.   HENT: Negative.    Eyes: Negative.  Negative for blurred vision.  Respiratory: Negative.  Negative for shortness of breath.   Cardiovascular: Negative.  Negative for chest pain and palpitations.  Endocrine: Negative.   Genitourinary: Negative.   Musculoskeletal:  Positive for arthralgias.       She c/o left ankle pain. States she may have twisted it when parking at work. States she may have twisted it in a hole in the ground.  Skin: Negative.   Allergic/Immunologic: Negative.   Neurological: Negative.   Hematological: Negative.   Psychiatric/Behavioral: Negative.      Today's Vitals   05/08/21 1502  BP: 120/76  Pulse: 88  Temp: 98.5 F (36.9 C)  Weight: 193 lb (87.5 kg)  Height: 5' 6"  (1.676 m)  PainSc: 0-No pain   Body mass index is 31.15 kg/m.   Wt Readings from Last 3 Encounters:  05/08/21 193 lb (87.5 kg)  04/13/21 190 lb (86.2 kg)  01/26/21 195 lb 9.6 oz (88.7 kg)     Objective:  Physical Exam Vitals and nursing note reviewed.  Constitutional:      Appearance: Normal appearance.  HENT:     Head: Normocephalic and atraumatic.     Right Ear: Tympanic membrane, ear canal and external ear  normal.     Left Ear: Tympanic membrane, ear canal and external ear normal.     Nose:     Comments: Masked     Mouth/Throat:     Comments: Masked  Eyes:     Extraocular Movements: Extraocular movements intact.     Conjunctiva/sclera: Conjunctivae normal.     Pupils: Pupils are equal, round, and reactive to light.  Cardiovascular:     Rate and Rhythm: Normal rate and regular rhythm.     Pulses: Normal pulses.          Dorsalis pedis pulses are 2+ on the right side and 2+ on the left side.     Heart sounds: Normal heart sounds.  Pulmonary:     Effort: Pulmonary effort is normal.     Breath sounds: Normal breath sounds.  Chest:  Breasts:    Tanner Score is 5.     Right: Normal.  Left: Normal.  Abdominal:     General: Abdomen is flat. Bowel sounds are normal.     Palpations: Abdomen is soft.  Genitourinary:    Comments: deferred Musculoskeletal:        General: Normal range of motion.     Cervical back: Normal range of motion and neck supple.  Feet:     Right foot:     Protective Sensation: 5 sites tested.  5 sites sensed.     Skin integrity: Dry skin present.     Toenail Condition: Right toenails are normal.     Left foot:     Protective Sensation: 5 sites tested.  5 sites sensed.     Skin integrity: Dry skin present.     Toenail Condition: Left toenails are normal.     Comments: Swelling left lateral ankle, slight pain with movement. No overlying erythema Skin:    General: Skin is warm and dry.  Neurological:     General: No focal deficit present.     Mental Status: She is alert and oriented to person, place, and time.  Psychiatric:        Mood and Affect: Mood normal.        Behavior: Behavior normal.        Assessment And Plan:     1. Encounter for general adult medical examination without abnormal findings Comments: A full exam was performed. Importance of monthly self breast exams was discussed with the patient. PATIENT IS ADVISED TO GET 30-45 MINUTES  REGULAR EXERCISE NO LESS THAN FOUR TO FIVE DAYS PER WEEK - BOTH WEIGHTBEARING EXERCISES AND AEROBIC ARE RECOMMENDED.  PATIENT IS ADVISED TO FOLLOW A HEALTHY DIET WITH AT LEAST SIX FRUITS/VEGGIES PER DAY, DECREASE INTAKE OF RED MEAT, AND TO INCREASE FISH INTAKE TO TWO DAYS PER WEEK.  MEATS/FISH SHOULD NOT BE FRIED, BAKED OR BROILED IS PREFERABLE.  IT IS ALSO IMPORTANT TO CUT BACK ON YOUR SUGAR INTAKE. PLEASE AVOID ANYTHING WITH ADDED SUGAR, CORN SYRUP OR OTHER SWEETENERS. IF YOU MUST USE A SWEETENER, YOU CAN TRY STEVIA. IT IS ALSO IMPORTANT TO AVOID ARTIFICIALLY SWEETENERS AND DIET BEVERAGES. LASTLY, I SUGGEST WEARING SPF 50 SUNSCREEN ON EXPOSED PARTS AND ESPECIALLY WHEN IN THE DIRECT SUNLIGHT FOR AN EXTENDED PERIOD OF TIME.  PLEASE AVOID FAST FOOD RESTAURANTS AND INCREASE YOUR WATER INTAKE.  - CMP14+EGFR - CBC - Lipid panel - Hemoglobin A1c  2. Uncontrolled type 2 diabetes mellitus with hyperglycemia ([place]) Comments: Diabetic foot exam was performed. She will f/u in 4 months. I DISCUSSED WITH THE PATIENT AT LENGTH REGARDING THE GOALS OF GLYCEMIC CONTROL AND POSSIBLE LONG-TERM COMPLICATIONS.  I  ALSO STRESSED THE IMPORTANCE OF COMPLIANCE WITH HOME GLUCOSE MONITORING, DIETARY RESTRICTIONS INCLUDING AVOIDANCE OF SUGARY DRINKS/PROCESSED FOODS,  ALONG WITH REGULAR EXERCISE.  I  ALSO STRESSED THE IMPORTANCE OF ANNUAL EYE EXAMS, SELF FOOT CARE AND COMPLIANCE WITH OFFICE VISITS.  - POCT Urinalysis Dipstick (81002) - POCT UA - Microalbumin  3. Essential hypertension, benign Comments: Chronic, well controlled. EKG performed, NSR w/o acute changes. Encouraged to follow low sodium diet. Agrees to f/u in 4-6 months.  - EKG 12-Lead  4. Acute left ankle pain Comments: She agrees to Podiatry referral for radiographic studies and further evaluation.  - Ambulatory referral to Podiatry  5. Class 1 obesity due to excess calories with serious comorbidity and body mass index (BMI) of 31.0 to 31.9 in adult She is  encouraged to strive for BMI less than 30 to decrease cardiac risk. Advised  to aim for at least 150 minutes of exercise per week.   Patient was given opportunity to ask questions. Patient verbalized understanding of the plan and was able to repeat key elements of the plan. All questions were answered to their satisfaction.   I, Michele Greenland, MD, have reviewed all documentation for this visit. The documentation on 05/08/21 for the exam, diagnosis, procedures, and orders are all accurate and complete.   THE PATIENT IS ENCOURAGED TO PRACTICE SOCIAL DISTANCING DUE TO THE COVID-19 PANDEMIC.

## 2021-05-09 LAB — LIPID PANEL
Chol/HDL Ratio: 3.7 ratio (ref 0.0–4.4)
Cholesterol, Total: 143 mg/dL (ref 100–199)
HDL: 39 mg/dL — ABNORMAL LOW (ref >39)
LDL Chol Calc (NIH): 67 mg/dL (ref 0–99)
Triglycerides: 223 mg/dL — ABNORMAL HIGH (ref 0–149)
VLDL Cholesterol Cal: 37 mg/dL (ref 5–40)

## 2021-05-09 LAB — CMP14+EGFR
ALT: 22 IU/L (ref 0–32)
AST: 29 IU/L (ref 0–40)
Albumin/Globulin Ratio: 1.3 (ref 1.2–2.2)
Albumin: 4.5 g/dL (ref 3.8–4.8)
Alkaline Phosphatase: 92 IU/L (ref 44–121)
BUN/Creatinine Ratio: 11 — ABNORMAL LOW (ref 12–28)
BUN: 11 mg/dL (ref 8–27)
Bilirubin Total: 0.4 mg/dL (ref 0.0–1.2)
CO2: 24 mmol/L (ref 20–29)
Calcium: 9.9 mg/dL (ref 8.7–10.3)
Chloride: 103 mmol/L (ref 96–106)
Creatinine, Ser: 0.96 mg/dL (ref 0.57–1.00)
Globulin, Total: 3.4 g/dL (ref 1.5–4.5)
Glucose: 96 mg/dL (ref 70–99)
Potassium: 4.5 mmol/L (ref 3.5–5.2)
Sodium: 140 mmol/L (ref 134–144)
Total Protein: 7.9 g/dL (ref 6.0–8.5)
eGFR: 64 mL/min/{1.73_m2} (ref >59)

## 2021-05-09 LAB — CBC
Hematocrit: 41.1 % (ref 34.0–46.6)
Hemoglobin: 14.1 g/dL (ref 11.1–15.9)
MCH: 26 pg — ABNORMAL LOW (ref 26.6–33.0)
MCHC: 34.3 g/dL (ref 31.5–35.7)
MCV: 76 fL — ABNORMAL LOW (ref 79–97)
Platelets: 329 10*3/uL (ref 150–450)
RBC: 5.42 x10E6/uL — ABNORMAL HIGH (ref 3.77–5.28)
RDW: 14.7 % (ref 11.7–15.4)
WBC: 7.9 10*3/uL (ref 3.4–10.8)

## 2021-05-09 LAB — HEMOGLOBIN A1C
Est. average glucose Bld gHb Est-mCnc: 128 mg/dL
Hgb A1c MFr Bld: 6.1 % — ABNORMAL HIGH (ref 4.8–5.6)

## 2021-07-17 ENCOUNTER — Other Ambulatory Visit: Payer: Self-pay | Admitting: Internal Medicine

## 2021-07-27 ENCOUNTER — Other Ambulatory Visit: Payer: Self-pay | Admitting: Internal Medicine

## 2021-08-14 ENCOUNTER — Encounter: Payer: Self-pay | Admitting: Internal Medicine

## 2021-09-12 ENCOUNTER — Ambulatory Visit (INDEPENDENT_AMBULATORY_CARE_PROVIDER_SITE_OTHER): Payer: Medicare Other | Admitting: Internal Medicine

## 2021-09-12 ENCOUNTER — Other Ambulatory Visit: Payer: Self-pay

## 2021-09-12 ENCOUNTER — Encounter: Payer: Self-pay | Admitting: Internal Medicine

## 2021-09-12 ENCOUNTER — Ambulatory Visit: Payer: Medicare Other | Admitting: Internal Medicine

## 2021-09-12 VITALS — BP 120/78 | HR 100 | Temp 98.1°F | Ht 66.0 in | Wt 190.0 lb

## 2021-09-12 DIAGNOSIS — Z23 Encounter for immunization: Secondary | ICD-10-CM

## 2021-09-12 DIAGNOSIS — I152 Hypertension secondary to endocrine disorders: Secondary | ICD-10-CM

## 2021-09-12 DIAGNOSIS — E6609 Other obesity due to excess calories: Secondary | ICD-10-CM | POA: Diagnosis not present

## 2021-09-12 DIAGNOSIS — Z6830 Body mass index (BMI) 30.0-30.9, adult: Secondary | ICD-10-CM

## 2021-09-12 DIAGNOSIS — E1159 Type 2 diabetes mellitus with other circulatory complications: Secondary | ICD-10-CM | POA: Diagnosis not present

## 2021-09-12 DIAGNOSIS — E785 Hyperlipidemia, unspecified: Secondary | ICD-10-CM

## 2021-09-12 DIAGNOSIS — E1169 Type 2 diabetes mellitus with other specified complication: Secondary | ICD-10-CM | POA: Diagnosis not present

## 2021-09-12 NOTE — Progress Notes (Signed)
Michele Pope,acting as a Education administrator for Michele Greenland, MD.,have documented all relevant documentation on the behalf of Michele Greenland, MD,as directed by  Michele Greenland, MD while in the presence of Michele Greenland, MD.  This visit occurred during the SARS-CoV-2 public health emergency.  Safety protocols were in place, including screening questions prior to the visit, additional usage of staff PPE, and extensive cleaning of exam room while observing appropriate contact time as indicated for disinfecting solutions.  Subjective:     Patient ID: Michele Pope , female    DOB: 01-22-1951 , 71 y.o.   MRN: [id number]   Chief Complaint  Patient presents with   Diabetes   Hypertension    HPI  The patient is here today for a diabetes and HTN f/u.  She reports compliance with meds. She denies headaches, chest pain and shortness of breath.   Diabetes She presents for her follow-up diabetic visit. She has type 2 diabetes mellitus. There are no hypoglycemic associated symptoms. Pertinent negatives for diabetes include no blurred vision and no chest pain. There are no hypoglycemic complications. Risk factors for coronary artery disease include diabetes mellitus, dyslipidemia, hypertension and post-menopausal.  Hypertension This is a chronic problem. The current episode started more than 1 year ago. The problem has been gradually improving since onset. The problem is controlled. Pertinent negatives include no blurred vision, chest pain, palpitations or shortness of breath.    Past Medical History:  Diagnosis Date   Diabetes mellitus without complication (HCC)    High cholesterol    HTN (hypertension)      Family History  Problem Relation Age of Onset   Heart attack Mother    Blindness Father      Current Outpatient Medications:    Blood Glucose Monitoring Suppl (ONE TOUCH ULTRA 2) w/Device KIT, Use as directed to check blood sugars 1 time per day dx: e11.22, Disp: 1 each, Rfl: 1    Cholecalciferol (VITAMIN D3) 125 MCG (5000 UT) CAPS, Take by mouth., Disp: , Rfl:    dexlansoprazole (DEXILANT) 60 MG capsule, 1 cap(s), Disp: , Rfl:    diclofenac sodium (VOLTAREN) 1 % GEL, Apply topically 4 (four) times daily., Disp: , Rfl:    ezetimibe (ZETIA) 10 MG tablet, TAKE 1 TABLET BY MOUTH EVERY DAY, Disp: 90 tablet, Rfl: 1   FARXIGA 10 MG TABS tablet, TAKE 1 TABLET BY MOUTH DAILY BEFORE BREAKFAST., Disp: 90 tablet, Rfl: 1   glucose blood (ONE TOUCH ULTRA TEST) test strip, Use as instructed to check blood sugars 1 time per day dx: e11.65, Disp: 100 each, Rfl: 11   icosapent Ethyl (VASCEPA) 1 g capsule, Take 1 capsule (1 g total) by mouth daily. 1 capsule daily, Disp: 90 capsule, Rfl: 1   JANUMET XR 50-1000 MG TB24, TAKE 1 TABLET BY MOUTH TWICE A DAY, Disp: 180 tablet, Rfl: 1   levocetirizine (XYZAL) 5 MG tablet, Take 5 mg by mouth every evening., Disp: , Rfl:    lisinopril (ZESTRIL) 10 MG tablet, TAKE 1 TABLET BY MOUTH EVERY DAY, Disp: 90 tablet, Rfl: 1   loratadine (CLARITIN) 10 MG tablet, TAKE 1 TABLET BY MOUTH EVERY DAY, Disp: 90 tablet, Rfl: 1   Misc Natural Products (NEURIVA PO), Take by mouth. 1 per day, Disp: , Rfl:    mometasone (NASONEX) 50 MCG/ACT nasal spray, SPRAY 2 SPRAYS INTO EACH NOSTRIL EVERY DAY, Disp: 51 each, Rfl: 1   Allergies  Allergen Reactions   Meloxicam Diarrhea  Pitavastatin      Review of Systems  Constitutional: Negative.   Eyes:  Negative for blurred vision.  Respiratory: Negative.  Negative for shortness of breath.   Cardiovascular: Negative.  Negative for chest pain and palpitations.  Gastrointestinal: Negative.   Neurological: Negative.   Psychiatric/Behavioral: Negative.      Today's Vitals   09/12/21 1531  BP: 120/78  Pulse: 100  Temp: 98.1 F (36.7 C)  Weight: 190 lb (86.2 kg)  Height: 5' 6"  (1.676 m)  PainSc: 0-No pain   Body mass index is 30.67 kg/m.  Wt Readings from Last 3 Encounters:  09/12/21 190 lb (86.2 kg)  05/08/21 193  lb (87.5 kg)  04/13/21 190 lb (86.2 kg)     Objective:  Physical Exam Vitals and nursing note reviewed.  Constitutional:      Appearance: Normal appearance.  HENT:     Head: Normocephalic and atraumatic.     Nose:     Comments: Masked     Mouth/Throat:     Comments: Masked  Eyes:     Extraocular Movements: Extraocular movements intact.  Cardiovascular:     Rate and Rhythm: Normal rate and regular rhythm.     Heart sounds: Normal heart sounds.  Pulmonary:     Effort: Pulmonary effort is normal.     Breath sounds: Normal breath sounds.  Musculoskeletal:     Cervical back: Normal range of motion.  Skin:    General: Skin is warm.  Neurological:     General: No focal deficit present.     Mental Status: She is alert.  Psychiatric:        Mood and Affect: Mood normal.        Behavior: Behavior normal.        Assessment And Plan:     Problem List Items Addressed This Visit       Cardiovascular and Mediastinum   Hypertension associated with diabetes ([place])    Well controlled. She will continue with current meds. She is encouraged to avoid adding salt to her foods. She will rto in six months for re-evaluation.          Endocrine   Diabetes ([place]) - Primary    No med changes, last a1c 6.1.  She will f/u in 3-4 months for re-evaluation.  I DISCUSSED WITH THE PATIENT AT LENGTH REGARDING THE GOALS OF GLYCEMIC CONTROL AND POSSIBLE LONG-TERM COMPLICATIONS.  I  ALSO STRESSED THE IMPORTANCE OF COMPLIANCE WITH HOME GLUCOSE MONITORING, DIETARY RESTRICTIONS INCLUDING AVOIDANCE OF SUGARY DRINKS/PROCESSED FOODS,  ALONG WITH REGULAR EXERCISE.  I  ALSO STRESSED THE IMPORTANCE OF ANNUAL EYE EXAMS, SELF FOOT CARE AND COMPLIANCE WITH OFFICE VISITS.        Other   Class 1 obesity due to excess calories with serious comorbidity and body mass index (BMI) of 30.0 to 30.9 in adult    Her BMI is acceptable for her demographics. She is encouraged to aim for at least 150 minutes of exercise per  week.      Other Visit Diagnoses     Immunization due       Relevant Orders   Pneumococcal conjugate vaccine 20-valent (Prevnar 20) (Completed)      Patient was given opportunity to ask questions. Patient verbalized understanding of the plan and was able to repeat key elements of the plan. All questions were answered to their satisfaction.   I, Michele Greenland, MD, have reviewed all documentation for this visit. The documentation on 09/12/21  for the exam, diagnosis, procedures, and orders are all accurate and complete.   IF YOU HAVE BEEN REFERRED TO A SPECIALIST, IT MAY TAKE 1-2 WEEKS TO SCHEDULE/PROCESS THE REFERRAL. IF YOU HAVE NOT HEARD FROM US/SPECIALIST IN TWO WEEKS, PLEASE GIVE [place] A CALL AT [phone].   THE PATIENT IS ENCOURAGED TO PRACTICE SOCIAL DISTANCING DUE TO THE COVID-19 PANDEMIC.

## 2021-09-12 NOTE — Assessment & Plan Note (Signed)
Well controlled. She will continue with current meds. She is encouraged to avoid adding salt to her foods. She will rto in six months for re-evaluation.

## 2021-09-12 NOTE — Patient Instructions (Signed)

## 2021-09-12 NOTE — Assessment & Plan Note (Addendum)
No med changes, last a1c 6.1.  She will f/u in 3-4 months for re-evaluation.  I DISCUSSED WITH THE PATIENT AT LENGTH REGARDING THE GOALS OF GLYCEMIC CONTROL AND POSSIBLE LONG-TERM COMPLICATIONS.  I  ALSO STRESSED THE IMPORTANCE OF COMPLIANCE WITH HOME GLUCOSE MONITORING, DIETARY RESTRICTIONS INCLUDING AVOIDANCE OF SUGARY DRINKS/PROCESSED FOODS,  ALONG WITH REGULAR EXERCISE.  I  ALSO STRESSED THE IMPORTANCE OF ANNUAL EYE EXAMS, SELF FOOT CARE AND COMPLIANCE WITH OFFICE VISITS.

## 2021-09-12 NOTE — Assessment & Plan Note (Signed)
Her BMI is acceptable for her demographics. She is encouraged to aim for at least 150 minutes of exercise per week.

## 2021-09-13 LAB — CMP14+EGFR
ALT: 20 IU/L (ref 0–32)
AST: 27 IU/L (ref 0–40)
Albumin/Globulin Ratio: 1.4 (ref 1.2–2.2)
Albumin: 4.5 g/dL (ref 3.8–4.8)
Alkaline Phosphatase: 89 IU/L (ref 44–121)
BUN/Creatinine Ratio: 10 — ABNORMAL LOW (ref 12–28)
BUN: 10 mg/dL (ref 8–27)
Bilirubin Total: 0.4 mg/dL (ref 0.0–1.2)
CO2: 23 mmol/L (ref 20–29)
Calcium: 10.2 mg/dL (ref 8.7–10.3)
Chloride: 101 mmol/L (ref 96–106)
Creatinine, Ser: 0.98 mg/dL (ref 0.57–1.00)
Globulin, Total: 3.2 g/dL (ref 1.5–4.5)
Glucose: 88 mg/dL (ref 70–99)
Potassium: 4.3 mmol/L (ref 3.5–5.2)
Sodium: 140 mmol/L (ref 134–144)
Total Protein: 7.7 g/dL (ref 6.0–8.5)
eGFR: 63 mL/min/{1.73_m2} (ref >59)

## 2021-09-13 LAB — HEMOGLOBIN A1C
Est. average glucose Bld gHb Est-mCnc: 126 mg/dL
Hgb A1c MFr Bld: 6 % — ABNORMAL HIGH (ref 4.8–5.6)

## 2021-09-15 ENCOUNTER — Other Ambulatory Visit: Payer: Self-pay | Admitting: Internal Medicine

## 2021-10-09 ENCOUNTER — Other Ambulatory Visit: Payer: Self-pay | Admitting: Internal Medicine

## 2021-11-20 ENCOUNTER — Encounter: Payer: Self-pay | Admitting: Internal Medicine

## 2021-11-20 LAB — HM DIABETES EYE EXAM

## 2021-11-20 LAB — HM MAMMOGRAPHY

## 2021-11-28 ENCOUNTER — Encounter: Payer: Self-pay | Admitting: Internal Medicine

## 2021-12-04 ENCOUNTER — Other Ambulatory Visit: Payer: Self-pay | Admitting: Internal Medicine

## 2021-12-04 ENCOUNTER — Ambulatory Visit (INDEPENDENT_AMBULATORY_CARE_PROVIDER_SITE_OTHER): Payer: Medicare Other | Admitting: Internal Medicine

## 2021-12-04 ENCOUNTER — Encounter: Payer: Self-pay | Admitting: Internal Medicine

## 2021-12-04 VITALS — BP 112/70 | HR 85 | Temp 97.6°F | Ht 66.0 in | Wt 194.8 lb

## 2021-12-04 DIAGNOSIS — E6609 Other obesity due to excess calories: Secondary | ICD-10-CM | POA: Diagnosis not present

## 2021-12-04 DIAGNOSIS — E1159 Type 2 diabetes mellitus with other circulatory complications: Secondary | ICD-10-CM | POA: Diagnosis not present

## 2021-12-04 DIAGNOSIS — I152 Hypertension secondary to endocrine disorders: Secondary | ICD-10-CM | POA: Diagnosis not present

## 2021-12-04 DIAGNOSIS — E1169 Type 2 diabetes mellitus with other specified complication: Secondary | ICD-10-CM | POA: Diagnosis not present

## 2021-12-04 DIAGNOSIS — Z6831 Body mass index (BMI) 31.0-31.9, adult: Secondary | ICD-10-CM

## 2021-12-04 LAB — BMP8+EGFR
BUN/Creatinine Ratio: 14 (ref 12–28)
BUN: 14 mg/dL (ref 8–27)
CO2: 24 mmol/L (ref 20–29)
Calcium: 9.7 mg/dL (ref 8.7–10.3)
Chloride: 103 mmol/L (ref 96–106)
Creatinine, Ser: 1.03 mg/dL — ABNORMAL HIGH (ref 0.57–1.00)
Glucose: 106 mg/dL — ABNORMAL HIGH (ref 70–99)
Potassium: 4.6 mmol/L (ref 3.5–5.2)
Sodium: 140 mmol/L (ref 134–144)
eGFR: 59 mL/min/{1.73_m2} — ABNORMAL LOW (ref >59)

## 2021-12-04 LAB — HEMOGLOBIN A1C
Est. average glucose Bld gHb Est-mCnc: 123 mg/dL
Hgb A1c MFr Bld: 5.9 % — ABNORMAL HIGH (ref 4.8–5.6)

## 2021-12-04 MED ORDER — OZEMPIC (0.25 OR 0.5 MG/DOSE) 2 MG/1.5ML ~~LOC~~ SOPN: 0.5000 mg | PEN_INJECTOR | SUBCUTANEOUS | 1 refills | Status: DC

## 2021-12-04 MED ORDER — XIGDUO XR 10-1000 MG PO TB24: 1.0000 | ORAL_TABLET | Freq: Every day | ORAL | 1 refills | Status: DC

## 2021-12-04 NOTE — Progress Notes (Signed)
?[name],acting as a Education administrator for [name], MD.,have documented all relevant documentation on the behalf of [name], MD,as directed by  [name], MD while in the presence of [name], MD.  ?This visit occurred during the SARS-CoV-2 public health emergency.  Safety protocols were in place, including screening questions prior to the visit, additional usage of staff PPE, and extensive cleaning of exam room while observing appropriate contact time as indicated for disinfecting solutions. ? ?Subjective:  ?  ? Patient ID: Michele Pope , female    DOB: 01-31-1951 , 71 y.o.   MRN: [id number] ? ? ?Chief Complaint  ?Patient presents with  ? Diabetes  ? Hypertension  ? ? ?HPI ? ?The patient is here today for a diabetes and HTN f/u.  She reports compliance with meds. She denies headaches, chest pain and shortness of breath. She states her sugars run between 100-105 in the mornings. Patient stated she would like to change her diabetes meds to Ozempic. She denies family history of thyroid cancer.  ? ?Diabetes ?She presents for her follow-up diabetic visit. She has type 2 diabetes mellitus. There are no hypoglycemic associated symptoms. Pertinent negatives for diabetes include no blurred vision, no chest pain and no weakness. There are no hypoglycemic complications. Risk factors for coronary artery disease include diabetes mellitus, dyslipidemia, hypertension and post-menopausal.  ?Hypertension ?This is a chronic problem. The current episode started more than 1 year ago. The problem has been gradually improving since onset. The problem is controlled. Pertinent negatives include no blurred vision, chest pain, palpitations or shortness of breath.   ? ?Past Medical History:  ?Diagnosis Date  ? Diabetes mellitus without complication ([place])   ? High cholesterol   ? HTN (hypertension)   ?  ? ?Family History  ?Problem Relation Age of Onset  ? Heart attack Mother   ? Blindness Father    ? ? ? ?Current Outpatient Medications:  ?  BIOTIN PO, Take 1 tablet by mouth daily at 12 noon., Disp: , Rfl:  ?  Blood Glucose Monitoring Suppl (ONE TOUCH ULTRA 2) w/Device KIT, Use as directed to check blood sugars 1 time per day dx: e11.22, Disp: 1 each, Rfl: 1 ?  Cholecalciferol (VITAMIN D3) 125 MCG (5000 UT) CAPS, Take by mouth., Disp: , Rfl:  ?  Cyanocobalamin ([facility] VITAMIN B12 PO), Take 1 tablet by mouth daily at 2 am., Disp: , Rfl:  ?  Dapagliflozin-metFORMIN HCl ER (XIGDUO XR) 04-999 MG TB24, Take 1 tablet by mouth daily., Disp: 90 tablet, Rfl: 1 ?  dexlansoprazole (DEXILANT) 60 MG capsule, 1 cap(s), Disp: , Rfl:  ?  ezetimibe (ZETIA) 10 MG tablet, TAKE 1 TABLET BY MOUTH EVERY DAY, Disp: 90 tablet, Rfl: 1 ?  glucose blood (ONE TOUCH ULTRA TEST) test strip, Use as instructed to check blood sugars 1 time per day dx: e11.65, Disp: 100 each, Rfl: 11 ?  icosapent Ethyl (VASCEPA) 1 g capsule, Take 1 capsule (1 g total) by mouth daily. 1 capsule daily, Disp: 90 capsule, Rfl: 1 ?  lisinopril (ZESTRIL) 10 MG tablet, TAKE 1 TABLET BY MOUTH EVERY DAY, Disp: 90 tablet, Rfl: 1 ?  loratadine (CLARITIN) 10 MG tablet, TAKE 1 TABLET BY MOUTH EVERY DAY, Disp: 90 tablet, Rfl: 1 ?  Misc Natural Products (NEURIVA PO), Take by mouth. 1 per day, Disp: , Rfl:  ?  mometasone (NASONEX) 50 MCG/ACT nasal spray, SPRAY 2 SPRAYS INTO EACH NOSTRIL EVERY DAY, Disp: 51 each, Rfl:  1 ?  Semaglutide,0.25 or 0.5MG/DOS, (OZEMPIC, 0.25 OR 0.5 MG/DOSE,) 2 MG/1.5ML SOPN, Inject 0.5 mg into the skin once a week., Disp: 1.5 mL, Rfl: 1 ?  diclofenac sodium (VOLTAREN) 1 % GEL, Apply topically 4 (four) times daily. (Patient not taking: Reported on 12/04/2021), Disp: , Rfl:  ?  levocetirizine (XYZAL) 5 MG tablet, Take 5 mg by mouth every evening. (Patient not taking: Reported on 12/04/2021), Disp: , Rfl:   ? ?Allergies  ?Allergen Reactions  ? Meloxicam Diarrhea  ? Pitavastatin   ?  ? ?Review of Systems  ?Constitutional: Negative.   ?Eyes:  Negative for  blurred vision.  ?Respiratory: Negative.  Negative for shortness of breath.   ?Cardiovascular: Negative.  Negative for chest pain and palpitations.  ?Gastrointestinal: Negative.   ?Neurological:  Negative for weakness.  ?Psychiatric/Behavioral: Negative.     ? ?Today's Vitals  ? 12/04/21 0905  ?BP: 112/70  ?Pulse: 85  ?Temp: 97.6 ?F (36.4 ?C)  ?Weight: 194 lb 12.8 oz (88.4 kg)  ?Height: 5' 6"  (1.676 m)  ? ?Body mass index is 31.44 kg/m?.  ?Wt Readings from Last 3 Encounters:  ?12/04/21 194 lb 12.8 oz (88.4 kg)  ?09/12/21 190 lb (86.2 kg)  ?05/08/21 193 lb (87.5 kg)  ?  ? ?Objective:  ?Physical Exam ?Vitals and nursing note reviewed.  ?Constitutional:   ?   Appearance: Normal appearance.  ?HENT:  ?   Head: Normocephalic and atraumatic.  ?Eyes:  ?   Extraocular Movements: Extraocular movements intact.  ?Cardiovascular:  ?   Rate and Rhythm: Normal rate and regular rhythm.  ?   Heart sounds: Normal heart sounds.  ?Pulmonary:  ?   Effort: Pulmonary effort is normal.  ?   Breath sounds: Normal breath sounds.  ?Musculoskeletal:  ?   Cervical back: Normal range of motion.  ?Skin: ?   General: Skin is warm.  ?Neurological:  ?   General: No focal deficit present.  ?   Mental Status: She is alert.  ?Psychiatric:     ?   Mood and Affect: Mood normal.     ?   Behavior: Behavior normal.  ?    ?Assessment And Plan:  ?   ?1. Type 2 diabetes mellitus with other specified complication, without long-term current use of insulin ([place]) ?Comments: She expressed wish to change meds - wants to try weekly injection. She denies personal/family history of thyroid cancer. She was advised to stop Janumet XR. Additionally, I will switch her from [place] to [place] 10/1000mg once daily. She was advised to take Xigduo in AM and will administer Ozempic once weekly on Mondays. WE DISCUSSED STARTING OZEMPIC TO HELP HER ACHIEVE GLYCEMIC CONTROL. SHE WILL START WITH 0.15m ONCE WEEKLY ON MONDAYS x 4 weeks,  THEN INCREASE TO 0.5MG ONCE WEEKLY. SHE IS  REMINDED TO STOP EATING WHEN FULL. POSSIBLE SIDE EFFECTS D/W PATIENT. SHE DENIES FAMILY HISTORY OF THYROID CANCER. SHE WILL RTO IN SIX WEEKS FOR RE-EVALUATION. ?- Hemoglobin A1c ?- BMP8+EGFR ? ?2. Hypertension associated with diabetes ([place] ?Comments: Chronic, well controlled. She is encouraged to follow low sodium diet.  She will c/w lisinopril 126mdaily.  ? ?3. Class 1 obesity due to excess calories with serious comorbidity and body mass index (BMI) of 31.0 to 31.9 in adult ? She is encouraged to strive for BMI less than 30 to decrease cardiac risk. Advised to aim for at least 150 minutes of exercise per week.  ? ?Patient was given opportunity to ask questions. Patient verbalized  understanding of the plan and was able to repeat key elements of the plan. All questions were answered to their satisfaction.  ? ?I, [name], MD, have reviewed all documentation for this visit. The documentation on 12/04/21 for the exam, diagnosis, procedures, and orders are all accurate and complete.  ? ?IF YOU HAVE BEEN REFERRED TO A SPECIALIST, IT MAY TAKE 1-2 WEEKS TO SCHEDULE/PROCESS THE REFERRAL. IF YOU HAVE NOT HEARD FROM US/SPECIALIST IN TWO WEEKS, PLEASE GIVE [place] A CALL AT [phone].  ? ?THE PATIENT IS ENCOURAGED TO PRACTICE SOCIAL DISTANCING DUE TO THE COVID-19 PANDEMIC.   ?

## 2021-12-04 NOTE — Patient Instructions (Addendum)
Stop JanumetXR ?Stop Marcelline Deist ? ?Start Xigduo 10/1000mg  once daily in AM ?Start Ozempic 0.25mg  weekly  ? ? ?Diabetes Mellitus and Nutrition, Adult ?When you have diabetes, or diabetes mellitus, it is very important to have healthy eating habits because your blood sugar (glucose) levels are greatly affected by what you eat and drink. Eating healthy foods in the right amounts, at about the same times every day, can help you: ?Manage your blood glucose. ?Lower your risk of heart disease. ?Improve your blood pressure. ?Reach or maintain a healthy weight. ?What can affect my meal plan? ?Every person with diabetes is different, and each person has different needs for a meal plan. Your health care provider may recommend that you work with a dietitian to make a meal plan that is best for you. Your meal plan may vary depending on factors such as: ?The calories you need. ?The medicines you take. ?Your weight. ?Your blood glucose, blood pressure, and cholesterol levels. ?Your activity level. ?Other health conditions you have, such as heart or kidney disease. ?How do carbohydrates affect me? ?Carbohydrates, also called carbs, affect your blood glucose level more than any other type of food. Eating carbs raises the amount of glucose in your blood. ?It is important to know how many carbs you can safely have in each meal. This is different for every person. Your dietitian can help you calculate how many carbs you should have at each meal and for each snack. ?How does alcohol affect me? ?Alcohol can cause a decrease in blood glucose (hypoglycemia), especially if you use insulin or take certain diabetes medicines by mouth. Hypoglycemia can be a life-threatening condition. Symptoms of hypoglycemia, such as sleepiness, dizziness, and confusion, are similar to symptoms of having too much alcohol. ?Do not drink alcohol if: ?Your health care provider tells you not to drink. ?You are pregnant, may be pregnant, or are planning to become  pregnant. ?If you drink alcohol: ?Limit how much you have to: ?0-1 drink a day for women. ?0-2 drinks a day for men. ?Know how much alcohol is in your drink. In the [place] one drink equals one 12 oz bottle of beer (355 mL), one 5 oz glass of wine (148 mL), or one 1? oz glass of hard liquor (44 mL). ?Keep yourself hydrated with water, diet soda, or unsweetened iced tea. Keep in mind that regular soda, juice, and other mixers may contain a lot of sugar and must be counted as carbs. ?What are tips for following this plan? ? ?Reading food labels ?Start by checking the serving size on the Nutrition Facts label of packaged foods and drinks. The number of calories and the amount of carbs, fats, and other nutrients listed on the label are based on one serving of the item. Many items contain more than one serving per package. ?Check the total grams (g) of carbs in one serving. ?Check the number of grams of saturated fats and trans fats in one serving. Choose foods that have a low amount or none of these fats. ?Check the number of milligrams (mg) of salt (sodium) in one serving. Most people should limit total sodium intake to less than 2,300 mg per day. ?Always check the nutrition information of foods labeled as "low-fat" or "nonfat." These foods may be higher in added sugar or refined carbs and should be avoided. ?Talk to your dietitian to identify your daily goals for nutrients listed on the label. ?Shopping ?Avoid buying canned, pre-made, or processed foods. These foods tend to be high  in fat, sodium, and added sugar. ?Shop around the outside edge of the grocery store. This is where you will most often find fresh fruits and vegetables, bulk grains, fresh meats, and fresh dairy products. ?Cooking ?Use low-heat cooking methods, such as baking, instead of high-heat cooking methods, such as deep frying. ?Cook using healthy oils, such as olive, canola, or sunflower oil. ?Avoid cooking with butter, cream, or high-fat meats. ?Meal  planning ?Eat meals and snacks regularly, preferably at the same times every day. Avoid going long periods of time without eating. ?Eat foods that are high in fiber, such as fresh fruits, vegetables, beans, and whole grains. ?Eat 4-6 oz (112-168 g) of lean protein each day, such as lean meat, chicken, fish, eggs, or tofu. One ounce (oz) (28 g) of lean protein is equal to: ?1 oz (28 g) of meat, chicken, or fish. ?1 egg. ?? cup (62 g) of tofu. ?Eat some foods each day that contain healthy fats, such as avocado, nuts, seeds, and fish. ?What foods should I eat? ?Fruits ?Berries. Apples. Oranges. Peaches. Apricots. Plums. Grapes. Mangoes. Papayas. Pomegranates. Kiwi. Cherries. ?Vegetables ?Leafy greens, including lettuce, spinach, kale, chard, collard greens, mustard greens, and cabbage. Beets. Cauliflower. Broccoli. Carrots. Green beans. Tomatoes. Peppers. Onions. Cucumbers. Brussels sprouts. ?Grains ?Whole grains, such as whole-wheat or whole-grain bread, crackers, tortillas, cereal, and pasta. Unsweetened oatmeal. Quinoa. Brown or wild rice. ?Meats and other proteins ?Seafood. Poultry without skin. Lean cuts of poultry and beef. Tofu. Nuts. Seeds. ?Dairy ?Low-fat or fat-free dairy products such as milk, yogurt, and cheese. ?The items listed above may not be a complete list of foods and beverages you can eat and drink. Contact a dietitian for more information. ?What foods should I avoid? ?Fruits ?Fruits canned with syrup. ?Vegetables ?Canned vegetables. Frozen vegetables with butter or cream sauce. ?Grains ?Refined white flour and flour products such as bread, pasta, snack foods, and cereals. Avoid all processed foods. ?Meats and other proteins ?Fatty cuts of meat. Poultry with skin. Breaded or fried meats. Processed meat. Avoid saturated fats. ?Dairy ?Full-fat yogurt, cheese, or milk. ?Beverages ?Sweetened drinks, such as soda or iced tea. ?The items listed above may not be a complete list of foods and beverages you  should avoid. Contact a dietitian for more information. ?Questions to ask a health care provider ?Do I need to meet with a certified diabetes care and education specialist? ?Do I need to meet with a dietitian? ?What number can I call if I have questions? ?When are the best times to check my blood glucose? ?Where to find more information: ?American Diabetes Association: diabetes.org ?Academy of Nutrition and Dietetics: eatright.org ?[employer] of Diabetes and Digestive and Kidney Diseases: StageSync.si ?Association of Diabetes Care & Education Specialists: diabeteseducator.org ?Summary ?It is important to have healthy eating habits because your blood sugar (glucose) levels are greatly affected by what you eat and drink. It is important to use alcohol carefully. ?A healthy meal plan will help you manage your blood glucose and lower your risk of heart disease. ?Your health care provider may recommend that you work with a dietitian to make a meal plan that is best for you. ?This information is not intended to replace advice given to you by your health care provider. Make sure you discuss any questions you have with your health care provider. ?Document Revised: 02/10/2020 Document Reviewed: 02/10/2020 ?Elsevier Patient Education ? 2023 Elsevier Inc. ? ?

## 2021-12-07 ENCOUNTER — Encounter: Payer: Self-pay | Admitting: Internal Medicine

## 2021-12-08 ENCOUNTER — Other Ambulatory Visit: Payer: Self-pay

## 2021-12-08 MED ORDER — OZEMPIC (0.25 OR 0.5 MG/DOSE) 2 MG/3ML ~~LOC~~ SOPN: 0.5000 mL | PEN_INJECTOR | SUBCUTANEOUS | 3 refills | Status: DC

## 2021-12-14 ENCOUNTER — Encounter: Payer: Self-pay | Admitting: Internal Medicine

## 2022-01-09 ENCOUNTER — Ambulatory Visit (INDEPENDENT_AMBULATORY_CARE_PROVIDER_SITE_OTHER): Payer: Medicare (Managed Care) | Admitting: Internal Medicine

## 2022-01-09 ENCOUNTER — Encounter: Payer: Self-pay | Admitting: Internal Medicine

## 2022-01-09 VITALS — BP 108/80 | HR 68 | Temp 98.1°F | Ht 66.0 in | Wt 189.2 lb

## 2022-01-09 DIAGNOSIS — E6609 Other obesity due to excess calories: Secondary | ICD-10-CM

## 2022-01-09 DIAGNOSIS — E785 Hyperlipidemia, unspecified: Secondary | ICD-10-CM

## 2022-01-09 DIAGNOSIS — I152 Hypertension secondary to endocrine disorders: Secondary | ICD-10-CM

## 2022-01-09 DIAGNOSIS — E1169 Type 2 diabetes mellitus with other specified complication: Secondary | ICD-10-CM | POA: Diagnosis not present

## 2022-01-09 DIAGNOSIS — E1159 Type 2 diabetes mellitus with other circulatory complications: Secondary | ICD-10-CM | POA: Diagnosis not present

## 2022-01-09 DIAGNOSIS — Z6830 Body mass index (BMI) 30.0-30.9, adult: Secondary | ICD-10-CM

## 2022-01-09 NOTE — Patient Instructions (Addendum)
Start 0.5mg  once weekly on 6/26 x 4 weeks,  Then 0.5mg  PLUS 0.25mg  x 4 weeks, then 1mg  x 4 week  Type 2 Diabetes Mellitus, Diagnosis, Adult Type 2 diabetes (type 2 diabetes mellitus) is a long-term (chronic) disease. It may happen when there is one or both of these problems: The pancreas does not make enough insulin. The body does not react in a normal way to insulin that it makes. Insulin lets sugars go into cells in your body. If you have type 2 diabetes, sugars cannot get into your cells. Sugars build up in the blood. This causes high blood sugar. What are the causes? The exact cause of this condition is not known. What increases the risk? Having type 2 diabetes in your family. Being overweight or very overweight. Not being active. Your body not reacting in a normal way to the insulin it makes. Having higher than normal blood sugar over time. Having a type of diabetes when you were pregnant. Having a condition that causes small fluid-filled sacs on your ovaries. What are the signs or symptoms? At first, you may have no symptoms. You will get symptoms slowly. They may include: More thirst than normal. More hunger than normal. Needing to pee more than normal. Losing weight without trying. Feeling tired. Feeling weak. Seeing things blurry. Dark patches on your skin. How is this treated? This condition may be treated by a diabetes expert. You may need to: Follow an eating plan made by a food expert (dietitian). Get regular exercise. Find ways to deal with stress. Check blood sugar as often as told. Take medicines. Your doctor will set treatment goals for you. Your blood sugar should be at these levels: Before meals: 80-130 mg/dL ( mmol/L). After meals: below 180 mg/dL (10 mmol/L). Over the last 2-3 months: less than 7%. Follow these instructions at home: Medicines Take your diabetes medicines or insulin every day. Take medicines as told to help you prevent other  problems caused by this condition. You may need: Aspirin. Medicine to lower cholesterol. Medicine to control blood pressure. Questions to ask your doctor Should I meet with a diabetes educator? What medicines do I need, and when should I take them? What will I need to treat my condition at home? When should I check my blood sugar? Where can I find a support group? Who can I call if I have questions? When is my next doctor visit? General instructions Take over-the-counter and prescription medicines only as told by your doctor. Keep all follow-up visits. Where to find more information For help and guidance and more information about diabetes, please go to: American Diabetes Association (ADA): www.diabetes.org American Association of Diabetes Care and Education Specialists (ADCES): www.diabeteseducator.org International Diabetes Federation (IDF): 3.0-1.6 Contact a doctor if: Your blood sugar is at or above 240 mg/dL (DCOnly.dk mmol/L) for 2 days in a row. You have been sick for 2 days or more, and you are not getting better. You have had a fever for 2 days or more, and you are not getting better. You have any of these problems for more than 6 hours: You cannot eat or drink. You feel like you may vomit. You vomit. You have watery poop (diarrhea). Get help right away if: Your blood sugar is lower than 54 mg/dL (3 mmol/L). You feel mixed up (confused). You have trouble thinking clearly. You have trouble breathing. You have medium or large ketone levels in your pee. These symptoms may be an emergency. Get help right away. Call  your local emergency services (911 in the [place] Do not wait to see if the symptoms will go away. Do not drive yourself to the hospital. Summary Type 2 diabetes is a long-term disease. Your pancreas may not make enough insulin, or your body may not react in a normal way to insulin that it makes. This condition is treated with an eating plan, lifestyle changes,  and medicines. Your doctor will set treatment goals for you. These will help you keep your blood sugar in a healthy range. Keep all follow-up visits. This information is not intended to replace advice given to you by your health care provider. Make sure you discuss any questions you have with your health care provider. Document Revised: 10/03/2020 Document Reviewed: 10/03/2020 Elsevier Patient Education  2023 [employer].

## 2022-01-09 NOTE — Progress Notes (Signed)
I,Michele Pope,acting as a scribe for Michele Greenland, MD.,have documented all relevant documentation on the behalf of Michele Greenland, MD,as directed by  Michele Greenland, MD while in the presence of Michele Greenland, MD.  This visit occurred during the SARS-CoV-2 public health emergency.  Safety protocols were in place, including screening questions prior to the visit, additional usage of staff PPE, and extensive cleaning of exam room while observing appropriate contact time as indicated for disinfecting solutions.  Subjective:     Patient ID: Michele Pope , female    DOB: 02-28-1951 , 71 y.o.   MRN: [id number]   Chief Complaint  Patient presents with   Medication follow up    HPI  The patient is here today for a [place] f/u. She reports compliance with medications. She reports running out of needles for Ozempic for the 2 last weeks. She is currently on 0.41m weekly. She has not had any issues with the medication.   Diabetes She presents for her follow-up diabetic visit. She has type 2 diabetes mellitus. There are no hypoglycemic associated symptoms. Pertinent negatives for diabetes include no blurred vision, no chest pain and no weakness. There are no hypoglycemic complications. Risk factors for coronary artery disease include diabetes mellitus, dyslipidemia, hypertension and post-menopausal.  Hypertension This is a chronic problem. The current episode started more than 1 year ago. The problem has been gradually improving since onset. The problem is controlled. Pertinent negatives include no blurred vision, chest pain, palpitations or shortness of breath.     Past Medical History:  Diagnosis Date   Diabetes mellitus without complication (HCC)    High cholesterol    HTN (hypertension)      Family History  Problem Relation Age of Onset   Heart attack Mother    Blindness Father      Current Outpatient Medications:    BIOTIN PO, Take 1 tablet by mouth daily at  12 noon., Disp: , Rfl:    Blood Glucose Monitoring Suppl (ONE TOUCH ULTRA 2) w/Device KIT, Use as directed to check blood sugars 1 time per day dx: e11.22, Disp: 1 each, Rfl: 1   Cholecalciferol (VITAMIN D3) 125 MCG (5000 UT) CAPS, Take by mouth., Disp: , Rfl:    Cyanocobalamin ([employer] VITAMIN B12 PO), Take 1 tablet by mouth daily at 2 am., Disp: , Rfl:    Dapagliflozin-metFORMIN HCl ER (XIGDUO XR) 04-999 MG TB24, Take 1 tablet by mouth daily., Disp: 90 tablet, Rfl: 1   dexlansoprazole (DEXILANT) 60 MG capsule, 1 cap(s), Disp: , Rfl:    glucose blood (ONE TOUCH ULTRA TEST) test strip, Use as instructed to check blood sugars 1 time per day dx: e11.65, Disp: 100 each, Rfl: 11   icosapent Ethyl (VASCEPA) 1 g capsule, Take 1 capsule (1 g total) by mouth daily. 1 capsule daily, Disp: 90 capsule, Rfl: 1   lisinopril (ZESTRIL) 10 MG tablet, TAKE 1 TABLET BY MOUTH EVERY DAY, Disp: 90 tablet, Rfl: 1   loratadine (CLARITIN) 10 MG tablet, TAKE 1 TABLET BY MOUTH EVERY DAY, Disp: 90 tablet, Rfl: 1   Misc Natural Products (NEURIVA PO), Take by mouth. 1 per day, Disp: , Rfl:    mometasone (NASONEX) 50 MCG/ACT nasal spray, SPRAY 2 SPRAYS INTO EACH NOSTRIL EVERY DAY, Disp: 51 each, Rfl: 1   Semaglutide,0.25 or 0.5MG/DOS, (OZEMPIC, 0.25 OR 0.5 MG/DOSE,) 2 MG/3ML SOPN, Inject 0.5 mLs into the skin once a week., Disp: 9 mL, Rfl: 3  diclofenac sodium (VOLTAREN) 1 % GEL, Apply topically 4 (four) times daily. (Patient not taking: Reported on 12/04/2021), Disp: , Rfl:    ezetimibe (ZETIA) 10 MG tablet, TAKE 1 TABLET BY MOUTH EVERY DAY, Disp: 90 tablet, Rfl: 1   levocetirizine (XYZAL) 5 MG tablet, Take 5 mg by mouth every evening. (Patient not taking: Reported on 12/04/2021), Disp: , Rfl:    Allergies  Allergen Reactions   Meloxicam Diarrhea   Pitavastatin      Review of Systems  Constitutional: Negative.   Eyes:  Negative for blurred vision.  Respiratory: Negative.  Negative for shortness of breath.    Cardiovascular: Negative.  Negative for chest pain and palpitations.  Neurological: Negative.  Negative for weakness.  Psychiatric/Behavioral: Negative.       Today's Vitals   01/09/22 1434  BP: 108/80  Pulse: 68  Temp: 98.1 F (36.7 C)  SpO2: 98%  Weight: 189 lb 3.2 oz (85.8 kg)  Height: 5' 6"  (1.676 m)  PainSc: 0-No pain   Body mass index is 30.54 kg/m.  Wt Readings from Last 3 Encounters:  01/09/22 189 lb 3.2 oz (85.8 kg)  12/04/21 194 lb 12.8 oz (88.4 kg)  09/12/21 190 lb (86.2 kg)    Objective:  Physical Exam Vitals and nursing note reviewed.  Constitutional:      Appearance: Normal appearance.  HENT:     Head: Normocephalic and atraumatic.  Cardiovascular:     Rate and Rhythm: Normal rate and regular rhythm.     Heart sounds: Normal heart sounds.  Pulmonary:     Effort: Pulmonary effort is normal.     Breath sounds: Normal breath sounds.  Musculoskeletal:     Cervical back: Normal range of motion.  Skin:    General: Skin is warm.  Neurological:     General: No focal deficit present.     Mental Status: She is alert.  Psychiatric:        Mood and Affect: Mood normal.        Behavior: Behavior normal.         Assessment And Plan:     1. Dyslipidemia associated with type 2 diabetes mellitus ([place]) Comments: Chronic. I plan to increase Ozempic dose to 0.26m weekly. She will f/u in 8 weeks for her next a1c check.   2. Hy[place] diabetes [place] Comments: Chronic, well controlled.   3. Class 1 obesity due to excess calories with serious comorbidity and body mass index (BMI) of 30.0 to 30.9 in adult  She is encouraged to strive for BMI less than 30 to decrease cardiac risk. Advised to aim for at least 150 minutes of exercise per week.    Patient was given opportunity to ask questions. Patient verbalized understanding of the plan and was able to repeat key elements of the plan. All questions were answered to their satisfaction.   I, RMaximino Greenland MD, have reviewed all documentation for this visit. The documentation on 01/09/22 for the exam, diagnosis, procedures, and orders are all accurate and complete.   IF YOU HAVE BEEN REFERRED TO A SPECIALIST, IT MAY TAKE 1-2 WEEKS TO SCHEDULE/PROCESS THE REFERRAL. IF YOU HAVE NOT HEARD FROM US/SPECIALIST IN TWO WEEKS, PLEASE GIVE [place] CALL AT [phone].   THE PATIENT IS ENCOURAGED TO PRACTICE SOCIAL DISTANCING DUE TO THE COVID-19 PANDEMIC.

## 2022-01-10 ENCOUNTER — Other Ambulatory Visit: Payer: Self-pay | Admitting: Internal Medicine

## 2022-01-17 ENCOUNTER — Encounter: Payer: Self-pay | Admitting: Internal Medicine

## 2022-01-17 ENCOUNTER — Ambulatory Visit: Payer: Medicare Other | Admitting: Internal Medicine

## 2022-01-22 ENCOUNTER — Encounter: Payer: Self-pay | Admitting: Internal Medicine

## 2022-01-22 ENCOUNTER — Other Ambulatory Visit: Payer: Self-pay

## 2022-01-22 MED ORDER — ONETOUCH ULTRA 2 W/DEVICE KIT: PACK | 1 refills | Status: DC

## 2022-01-22 MED ORDER — GLUCOSE BLOOD VI STRP: ORAL_STRIP | 11 refills | Status: DC

## 2022-01-24 ENCOUNTER — Encounter: Payer: Self-pay | Admitting: Internal Medicine

## 2022-02-23 ENCOUNTER — Encounter: Payer: Self-pay | Admitting: Internal Medicine

## 2022-02-23 ENCOUNTER — Other Ambulatory Visit: Payer: Self-pay

## 2022-02-23 MED ORDER — OZEMPIC (1 MG/DOSE) 2 MG/1.5ML ~~LOC~~ SOPN: 1.0000 mg | PEN_INJECTOR | SUBCUTANEOUS | 3 refills | Status: DC

## 2022-02-23 MED ORDER — OZEMPIC (0.25 OR 0.5 MG/DOSE) 2 MG/3ML ~~LOC~~ SOPN: 0.5000 mL | PEN_INJECTOR | SUBCUTANEOUS | 3 refills | Status: DC

## 2022-02-27 ENCOUNTER — Other Ambulatory Visit: Payer: Self-pay

## 2022-02-27 MED ORDER — LORATADINE 10 MG PO TABS: 10.0000 mg | ORAL_TABLET | Freq: Every day | ORAL | 1 refills | Status: AC

## 2022-02-27 MED ORDER — ICOSAPENT ETHYL 1 G PO CAPS: 1.0000 g | ORAL_CAPSULE | Freq: Every day | ORAL | 1 refills | Status: DC

## 2022-02-27 MED ORDER — LISINOPRIL 10 MG PO TABS: 10.0000 mg | ORAL_TABLET | Freq: Every day | ORAL | 1 refills | Status: DC

## 2022-02-28 ENCOUNTER — Other Ambulatory Visit: Payer: Self-pay | Admitting: Internal Medicine

## 2022-03-01 ENCOUNTER — Telehealth: Payer: Self-pay

## 2022-03-01 ENCOUNTER — Encounter: Payer: Self-pay | Admitting: Internal Medicine

## 2022-03-01 NOTE — Telephone Encounter (Signed)
Patient notified that her ozempic has been approved until 02/27/23 [facility]

## 2022-03-08 ENCOUNTER — Other Ambulatory Visit: Payer: Self-pay

## 2022-03-08 ENCOUNTER — Other Ambulatory Visit (HOSPITAL_COMMUNITY): Payer: Self-pay

## 2022-03-08 ENCOUNTER — Telehealth: Payer: Self-pay

## 2022-03-08 MED ORDER — OZEMPIC (1 MG/DOSE) 4 MG/3ML ~~LOC~~ SOPN
1.0000 mg | PEN_INJECTOR | SUBCUTANEOUS | 3 refills | Status: DC
  Filled 2022-03-08: qty 3, 28d supply, fill #0

## 2022-03-08 NOTE — Telephone Encounter (Signed)
Patient aware. Patient notified, ozempic sent into [facility] & she will pick up Monday after her appointment.

## 2022-03-12 ENCOUNTER — Other Ambulatory Visit (HOSPITAL_COMMUNITY): Payer: Self-pay

## 2022-03-12 ENCOUNTER — Ambulatory Visit (INDEPENDENT_AMBULATORY_CARE_PROVIDER_SITE_OTHER): Payer: Medicare Other | Admitting: Internal Medicine

## 2022-03-12 ENCOUNTER — Encounter: Payer: Self-pay | Admitting: Internal Medicine

## 2022-03-12 VITALS — BP 110/78 | HR 83 | Temp 98.3°F | Ht 66.0 in | Wt 179.4 lb

## 2022-03-12 DIAGNOSIS — K5903 Drug induced constipation: Secondary | ICD-10-CM

## 2022-03-12 DIAGNOSIS — E663 Overweight: Secondary | ICD-10-CM

## 2022-03-12 DIAGNOSIS — Z6828 Body mass index (BMI) 28.0-28.9, adult: Secondary | ICD-10-CM

## 2022-03-12 DIAGNOSIS — I152 Hypertension secondary to endocrine disorders: Secondary | ICD-10-CM

## 2022-03-12 DIAGNOSIS — E1159 Type 2 diabetes mellitus with other circulatory complications: Secondary | ICD-10-CM

## 2022-03-12 DIAGNOSIS — E785 Hyperlipidemia, unspecified: Secondary | ICD-10-CM

## 2022-03-12 DIAGNOSIS — E1169 Type 2 diabetes mellitus with other specified complication: Secondary | ICD-10-CM | POA: Diagnosis not present

## 2022-03-12 MED ORDER — XIGDUO XR 10-1000 MG PO TB24
1.0000 | ORAL_TABLET | Freq: Every day | ORAL | 1 refills | Status: DC
Start: 2022-03-12 — End: 2022-05-28

## 2022-03-12 MED ORDER — DEXLANSOPRAZOLE 60 MG PO CPDR
DELAYED_RELEASE_CAPSULE | ORAL | 1 refills | Status: DC
Start: 2022-03-12 — End: 2022-04-05

## 2022-03-12 MED ORDER — MOMETASONE FUROATE 50 MCG/ACT NA SUSP: NASAL | 1 refills | Status: DC

## 2022-03-12 NOTE — Patient Instructions (Signed)

## 2022-03-12 NOTE — Progress Notes (Signed)
Barnet Glasgow Martin,acting as a Education administrator for [name], MD.,have documented all relevant documentation on the behalf of [name], MD,as directed by  [name], MD while in the presence of [name], MD.    Subjective:     Patient ID: Michele Pope , female    DOB: 08/06/1950 , 71 y.o.   MRN: [id number]   Chief Complaint  Patient presents with   Diabetes   Hypertension    HPI  The patient is here today for a diabetes and HTN f/u.  She reports compliance with meds. She denies headaches, chest pain and shortness of breath. She states her sugars run between 80-90 sometimes 100 in the mornings. Patient states the Ozempic is making her constipated. She has noticed having more gas pains as well.   BP Readings from Last 3 Encounters: 03/12/22 : 110/78 01/09/22 : 108/80 12/04/21 : 112/70    Diabetes She presents for her follow-up diabetic visit. She has type 2 diabetes mellitus. There are no hypoglycemic associated symptoms. Pertinent negatives for diabetes include no blurred vision, no chest pain and no weakness. There are no hypoglycemic complications. Risk factors for coronary artery disease include diabetes mellitus, dyslipidemia, hypertension and post-menopausal.  Hypertension This is a chronic problem. The current episode started more than 1 year ago. The problem has been gradually improving since onset. The problem is controlled. Pertinent negatives include no blurred vision, chest pain, palpitations or shortness of breath.     Past Medical History:  Diagnosis Date   Diabetes mellitus without complication (HCC)    High cholesterol    HTN (hypertension)      Family History  Problem Relation Age of Onset   Heart attack Mother    Blindness Father      Current Outpatient Medications:    Blood Glucose Monitoring Suppl (ONE TOUCH ULTRA 2) w/Device KIT, Use as directed to check blood sugars 1 time per day dx: e11.22, Disp: 1 kit, Rfl: 1   ezetimibe (ZETIA) 10  MG tablet, TAKE 1 TABLET BY MOUTH EVERY DAY, Disp: 90 tablet, Rfl: 1   glucose blood (ONE TOUCH ULTRA TEST) test strip, Use as instructed to check blood sugars 1 time per day dx: e11.65, Disp: 100 each, Rfl: 11   lisinopril (ZESTRIL) 10 MG tablet, Take 1 tablet (10 mg total) by mouth daily., Disp: 90 tablet, Rfl: 1   loratadine (CLARITIN) 10 MG tablet, Take 1 tablet (10 mg total) by mouth daily., Disp: 90 tablet, Rfl: 1   Misc Natural Products (NEURIVA PO), Take by mouth. 1 per day, Disp: , Rfl:    Semaglutide, 1 MG/DOSE, (OZEMPIC, 1 MG/DOSE,) 4 MG/3ML SOPN, Inject 1 mg into the skin once a week., Disp: 9 mL, Rfl: 3   BIOTIN PO, Take 1 tablet by mouth daily at 12 noon. (Patient not taking: Reported on 03/12/2022), Disp: , Rfl:    Cholecalciferol (VITAMIN D3) 125 MCG (5000 UT) CAPS, Take by mouth. (Patient not taking: Reported on 03/12/2022), Disp: , Rfl:    Cyanocobalamin ([employer] VITAMIN B12 PO), Take 1 tablet by mouth daily at 2 am. (Patient not taking: Reported on 03/12/2022), Disp: , Rfl:    Dapagliflozin-metFORMIN HCl ER (XIGDUO XR) 04-999 MG TB24, Take 1 tablet by mouth daily., Disp: 90 tablet, Rfl: 1   dexlansoprazole (DEXILANT) 60 MG capsule, 1 cap(s), Disp: 90 capsule, Rfl: 1   diclofenac sodium (VOLTAREN) 1 % GEL, Apply topically 4 (four) times daily. (Patient not taking: Reported on 12/04/2021),  Disp: , Rfl:    icosapent Ethyl (VASCEPA) 1 g capsule, Take 1 capsule (1 g total) by mouth daily. 1 capsule daily (Patient not taking: Reported on 03/12/2022), Disp: 90 capsule, Rfl: 1   mometasone (NASONEX) 50 MCG/ACT nasal spray, SPRAY 2 SPRAYS INTO EACH NOSTRIL EVERY DAY, Disp: 51 each, Rfl: 1   Allergies  Allergen Reactions   Meloxicam Diarrhea   Pitavastatin      Review of Systems  Constitutional: Negative.   Eyes:  Negative for blurred vision.  Respiratory: Negative.  Negative for shortness of breath.   Cardiovascular: Negative.  Negative for chest pain and palpitations.  Gastrointestinal:   Positive for constipation.  Neurological: Negative.  Negative for weakness.  Psychiatric/Behavioral: Negative.       Today's Vitals   03/12/22 1441  BP: 110/78  Pulse: 83  Temp: 98.3 F (36.8 C)  TempSrc: Oral  Weight: 179 lb 6.4 oz (81.4 kg)  Height: $Remove'5\' 6"'fJKwPDL$  (1.676 m)  PainSc: 0-No pain   Body mass index is 28.96 kg/m.  Wt Readings from Last 3 Encounters:  03/12/22 179 lb 6.4 oz (81.4 kg)  01/09/22 189 lb 3.2 oz (85.8 kg)  12/04/21 194 lb 12.8 oz (88.4 kg)    Objective:  Physical Exam Vitals and nursing note reviewed.  Constitutional:      Appearance: Normal appearance.  HENT:     Head: Normocephalic and atraumatic.  Eyes:     Extraocular Movements: Extraocular movements intact.  Cardiovascular:     Rate and Rhythm: Normal rate and regular rhythm.     Heart sounds: Normal heart sounds.  Pulmonary:     Effort: Pulmonary effort is normal.     Breath sounds: Normal breath sounds.  Abdominal:     General: Bowel sounds are normal.     Palpations: Abdomen is soft.  Musculoskeletal:     Cervical back: Normal range of motion.  Skin:    General: Skin is warm.  Neurological:     General: No focal deficit present.     Mental Status: She is alert.  Psychiatric:        Mood and Affect: Mood normal.        Behavior: Behavior normal.      Assessment And Plan:     1. Dyslipidemia associated with type 2 diabetes mellitus ([place]) Comments: Chronic, she will c/w Ozempic $RemoveBef'1mg'vBDZyMHzBS$  weekly and Xigduo 04/999 once daily.  - Hemoglobin A1c - CMP14+EGFR  2. Hypertension associated with diabetes ([place]) Comments: Chronic, well controlled. I will check renal function today. She will c/w lisinopril $RemoveBefore'10mg'[id number]$  daily.   3. Drug-induced constipation Comments: Her sx are likely exacerbated/caused by Ozempic. Advised to stay well hydrated and take stool softener. May need Miralax and/or Linzess if sx persist.   4. Overweight with body mass index (BMI) of 28 to 28.9 in adult Comments: She has lost 10  lbs since June! Her BMI is acceptable for her demographic. She is encouraged to aim for at least 150 minutes of exercise per week.     Patient was given opportunity to ask questions. Patient verbalized understanding of the plan and was able to repeat key elements of the plan. All questions were answered to their satisfaction.   I, [name], MD, have reviewed all documentation for this visit. The documentation on 03/12/22 for the exam, diagnosis, procedures, and orders are all accurate and complete.   IF YOU HAVE BEEN REFERRED TO A SPECIALIST, IT MAY TAKE 1-2 WEEKS TO SCHEDULE/PROCESS THE REFERRAL.  IF YOU HAVE NOT HEARD FROM US/SPECIALIST IN TWO WEEKS, PLEASE GIVE [place] A CALL AT [phone].   THE PATIENT IS ENCOURAGED TO PRACTICE SOCIAL DISTANCING DUE TO THE COVID-19 PANDEMIC.

## 2022-03-13 LAB — CMP14+EGFR
ALT: 17 IU/L (ref 0–32)
AST: 25 IU/L (ref 0–40)
Albumin/Globulin Ratio: 1.5 (ref 1.2–2.2)
Albumin: 4.2 g/dL (ref 3.9–4.9)
Alkaline Phosphatase: 102 IU/L (ref 44–121)
BUN/Creatinine Ratio: 10 — ABNORMAL LOW (ref 12–28)
BUN: 10 mg/dL (ref 8–27)
Bilirubin Total: 0.5 mg/dL (ref 0.0–1.2)
CO2: 21 mmol/L (ref 20–29)
Calcium: 10 mg/dL (ref 8.7–10.3)
Chloride: 102 mmol/L (ref 96–106)
Creatinine, Ser: 1 mg/dL (ref 0.57–1.00)
Globulin, Total: 2.8 g/dL (ref 1.5–4.5)
Glucose: 77 mg/dL (ref 70–99)
Potassium: 4.2 mmol/L (ref 3.5–5.2)
Sodium: 139 mmol/L (ref 134–144)
Total Protein: 7 g/dL (ref 6.0–8.5)
eGFR: 61 mL/min/{1.73_m2} (ref >59)

## 2022-03-13 LAB — HEMOGLOBIN A1C
Est. average glucose Bld gHb Est-mCnc: 117 mg/dL
Hgb A1c MFr Bld: 5.7 % — ABNORMAL HIGH (ref 4.8–5.6)

## 2022-04-04 ENCOUNTER — Encounter: Payer: Self-pay | Admitting: Internal Medicine

## 2022-04-05 ENCOUNTER — Other Ambulatory Visit: Payer: Self-pay | Admitting: Internal Medicine

## 2022-04-05 MED ORDER — OMEPRAZOLE 40 MG PO CPDR: 40.0000 mg | DELAYED_RELEASE_CAPSULE | Freq: Every day | ORAL | 0 refills | Status: DC

## 2022-04-16 ENCOUNTER — Other Ambulatory Visit: Payer: Self-pay | Admitting: Internal Medicine

## 2022-04-21 ENCOUNTER — Ambulatory Visit (INDEPENDENT_AMBULATORY_CARE_PROVIDER_SITE_OTHER): Payer: Medicare Other

## 2022-04-21 DIAGNOSIS — Z0000 Encounter for general adult medical examination without abnormal findings: Secondary | ICD-10-CM | POA: Diagnosis not present

## 2022-04-21 NOTE — Patient Instructions (Signed)

## 2022-04-21 NOTE — Progress Notes (Signed)
Subjective:   Michele Pope is a 71 y.o. female who presents for Medicare Annual (Subsequent) preventive examination. I connected with  Jeralene Peters on 04/21/22 by a audio enabled telemedicine application and verified that I am speaking with the correct person using two identifiers.  Patient Location: Home  Provider Location: Office/Clinic  I discussed the limitations of evaluation and management by telemedicine. The patient expressed understanding and agreed to proceed.       Objective:    There were no vitals filed for this visit. There is no height or weight on file to calculate BMI.     04/13/2021   12:01 PM 02/24/2020   10:39 AM 02/17/2019    4:08 PM  Advanced Directives  Does Patient Have a Medical Advance Directive? No No No  Would patient like information on creating a medical advance directive?  Yes (MAU/Ambulatory/Procedural Areas - Information given) No - Patient declined    Current Medications (verified) Outpatient Encounter Medications as of 04/21/2022  Medication Sig   BIOTIN PO Take 1 tablet by mouth daily at 12 noon. (Patient not taking: Reported on 03/12/2022)   Blood Glucose Monitoring Suppl (ONE TOUCH ULTRA 2) w/Device KIT Use as directed to check blood sugars 1 time per day dx: e11.22   Cholecalciferol (VITAMIN D3) 125 MCG (5000 UT) CAPS Take by mouth. (Patient not taking: Reported on 03/12/2022)   Cyanocobalamin ([employer] VITAMIN B12 PO) Take 1 tablet by mouth daily at 2 am. (Patient not taking: Reported on 03/12/2022)   Dapagliflozin-metFORMIN HCl ER (XIGDUO XR) 04-999 MG TB24 Take 1 tablet by mouth daily.   diclofenac sodium (VOLTAREN) 1 % GEL Apply topically 4 (four) times daily. (Patient not taking: Reported on 12/04/2021)   ezetimibe (ZETIA) 10 MG tablet TAKE 1 TABLET BY MOUTH EVERY DAY   glucose blood (ONE TOUCH ULTRA TEST) test strip Use as instructed to check blood sugars 1 time per day dx: e11.65   icosapent Ethyl (VASCEPA) 1 g capsule Take 1 capsule (1 g  total) by mouth daily. 1 capsule daily (Patient not taking: Reported on 03/12/2022)   lisinopril (ZESTRIL) 10 MG tablet TAKE 1 TABLET BY MOUTH EVERY DAY   loratadine (CLARITIN) 10 MG tablet Take 1 tablet (10 mg total) by mouth daily.   Misc Natural Products (NEURIVA PO) Take by mouth. 1 per day   mometasone (NASONEX) 50 MCG/ACT nasal spray SPRAY 2 SPRAYS INTO EACH NOSTRIL EVERY DAY   omeprazole (PRILOSEC) 40 MG capsule Take 1 capsule (40 mg total) by mouth daily.   Semaglutide, 1 MG/DOSE, (OZEMPIC, 1 MG/DOSE,) 4 MG/3ML SOPN Inject 1 mg into the skin once a week.   No facility-administered encounter medications on file as of 04/21/2022.    Allergies (verified) Meloxicam and Pitavastatin   History: Past Medical History:  Diagnosis Date   Diabetes mellitus without complication (HCC)    High cholesterol    HTN (hypertension)    Past Surgical History:  Procedure Laterality Date   BREAST EXCISIONAL BIOPSY Right 2000   SHOULDER ARTHROSCOPY W/ ROTATOR CUFF REPAIR Right 08/22/2018   TOTAL ABDOMINAL HYSTERECTOMY  1995   Family History  Problem Relation Age of Onset   Heart attack Mother    Blindness Father    Social History   Socioeconomic History   Marital status: Married    Spouse name: Not on file   Number of children: Not on file   Years of education: Not on file   Highest education level: Not on file  Occupational  History   Not on file  Tobacco Use   Smoking status: Never   Smokeless tobacco: Never  Vaping Use   Vaping Use: Never used  Substance and Sexual Activity   Alcohol use: Never   Drug use: Never   Sexual activity: Yes  Other Topics Concern   Not on file  Social History Narrative   Not on file   Social Determinants of Health   Financial Resource Strain: Low Risk  (04/13/2021)   Overall Financial Resource Strain (CARDIA)    Difficulty of Paying Living Expenses: Not hard at all  Food Insecurity: No Food Insecurity (04/13/2021)   Hunger Vital Sign    Worried  About Running Out of Food in the Last Year: Never true    Ran Out of Food in the Last Year: Never true  Transportation Needs: No Transportation Needs (04/13/2021)   PRAPARE - Hydrologist (Medical): No    Lack of Transportation (Non-Medical): No  Physical Activity: Inactive (04/13/2021)   Exercise Vital Sign    Days of Exercise per Week: 0 days    Minutes of Exercise per Session: 0 min  Stress: No Stress Concern Present (04/13/2021)   [place]    Feeling of Stress : Not at all  Social Connections: Not on file    Tobacco Counseling Counseling given: Not Answered   Clinical Intake:                 Diabetic? yes Nutrition Risk Assessment:  Has the patient had any N/V/D within the last 2 months?  No  Does the patient have any non-healing wounds?  No  Has the patient had any unintentional weight loss or weight gain?  No   Diabetes:  Is the patient diabetic?  Yes  If diabetic, was a CBG obtained today?  No  Did the patient bring in their glucometer from home?   na How often do you monitor your CBG's? 3 times a week.   Financial Strains and Diabetes Management:  Are you having any financial strains with the device, your supplies or your medication? No .  Does the patient want to be seen by Chronic Care Management for management of their diabetes?  No  Would the patient like to be referred to a Nutritionist or for Diabetic Management?  No   Diabetic Exams:  Diabetic Eye Exam: Completed 11/20/2021 Diabetic Foot Exam: Completed           Activities of Daily Living     No data to display           Patient Care Team: Glendale Chard, MD as PCP - General (Internal Medicine)  Indicate any recent Medical Services you may have received from other than [facility] providers in the past year (date may be approximate).     Assessment:   This is a routine wellness examination  for [place].  Hearing/Vision screen No results found.  Dietary issues and exercise activities discussed:     Goals Addressed   None   Depression Screen    03/12/2022    2:40 PM 04/13/2021   12:02 PM 02/24/2020   10:41 AM 02/23/2020    3:41 PM 02/17/2019    3:20 PM 11/11/2018    9:22 AM 09/15/2018    9:07 AM  PHQ 2/9 Scores  PHQ - 2 Score 0 0 0 0 0 0 0  PHQ- 9 Score   1  Fall Risk    03/12/2022    2:40 PM 04/13/2021   12:02 PM 02/24/2020   10:40 AM 02/23/2020    3:41 PM 12/10/2018   12:09 PM  [place] in the past year? 0 0 _0 Comment   slipped off ladder    Number falls in past yr: 0  0 0 0  Injury with Fall? 0  0 0 1  Comment    fell off of a ladder because it had a broken leg.   Risk for fall due to :  Medication side effect History of fall(s);Medication side effect    Follow up Falls evaluation completed Falls evaluation completed;Education provided;Falls prevention discussed       FALL RISK PREVENTION PERTAINING TO THE HOME:  Any stairs in or around the home? Yes  If so, are there any without handrails? Yes  Home free of loose throw rugs in walkways, pet beds, electrical cords, etc? Yes  Adequate lighting in your home to reduce risk of falls? Yes   ASSISTIVE DEVICES UTILIZED TO PREVENT FALLS:  Life alert? No  Use of a cane, walker or w/c? No  Grab bars in the bathroom? Yes  Shower chair or bench in shower? No  Elevated toilet seat or a handicapped toilet? No    Cognitive Function:        04/13/2021   12:03 PM 02/24/2020   10:42 AM 02/17/2019    3:37 PM  6CIT Screen  What Year? 0 points 0 points 0 points  What month? 0 points 0 points 0 points  What time? 0 points 0 points 0 points  Count back from 20 0 points 2 points 0 points  Months in reverse 0 points 0 points 0 points  Repeat phrase 2 points 4 points 0 points  Total Score 2 points 6 points 0 points    Immunizations Immunization History  Administered Date(s) Administered   Fluad  Quad(high Dose 65+) 04/13/2021   Influenza, High Dose Seasonal PF 05/14/2018   Influenza,inj,Quad PF,6+ Mos 04/16/2019   Influenza,inj,quad, With Preservative 04/22/2018   Influenza-Unspecified 04/20/2019, 04/25/2020, 04/13/2021   Moderna Sars-Covid-2 Vaccination 08/24/2019, 09/18/2019, 05/25/2020, 09/21/2020   PNEUMOCOCCAL CONJUGATE-20 09/12/2021   Pneumococcal Polysaccharide-23 05/03/2009, 02/17/2019   Tdap 09/15/2018   Zoster Recombinat (Shingrix) 02/26/2020, 08/05/2020    TDAP status: Up to date  Flu Vaccine status: Up to date  Pneumococcal vaccine status: Up to date  Covid-19 vaccine status: Completed vaccines  Qualifies for Shingles Vaccine? Yes   Zostavax completed Yes   Shingrix Completed?: Yes  Screening Tests Health Maintenance  Topic Date Due   COVID-19 Vaccine (5 - Moderna risk series) 11/16/2020   Diabetic kidney evaluation - Urine ACR  05/08/2022   FOOT EXAM  05/08/2022   HEMOGLOBIN A1C  09/12/2022   MAMMOGRAM  11/21/2022   OPHTHALMOLOGY EXAM  11/21/2022   Diabetic kidney evaluation - GFR measurement  03/13/2023   TETANUS/TDAP  09/15/2028   COLONOSCOPY (Pts 45-67yr Insurance coverage will need to be confirmed)  05/02/2031   Pneumonia Vaccine 69 Years old  Completed   INFLUENZA VACCINE  Completed   DEXA SCAN  Completed   Hepatitis C Screening  Completed   Zoster Vaccines- Shingrix  Completed   HPV VACCINES  Aged Out    Health Maintenance  Health Maintenance Due  Topic Date Due   COVID-19 Vaccine (5 - Moderna risk series) 11/16/2020   Diabetic kidney evaluation - Urine ACR  05/08/2022  Colorectal cancer screening: Type of screening: Colonoscopy. Completed 05/01/2021. Repeat every 7 years  Mammogram status: Completed 11/20/2021. Repeat every year  Bone Density status: Ordered 04/21/2022. Pt provided with contact info and advised to call to schedule appt.  Lung Cancer Screening: (Low Dose CT Chest recommended if Age 13-80 years, 30 pack-year  currently smoking OR have quit w/in 15years.) does not qualify.   Lung Cancer Screening Referral: na  Additional Screening:  Hepatitis C Screening: does qualify; Completed yes  Vision Screening: Recommended annual ophthalmology exams for early detection of glaucoma and other disorders of the eye. Is the patient up to date with their annual eye exam?  Yes  Who is the provider or what is the name of the office in which the patient attends annual eye exams? [facility].  If pt is not established with a provider, would they like to be referred to a provider to establish care?  established .   Dental Screening: Recommended annual dental exams for proper oral hygiene  Community Resource Referral / Chronic Care Management: CRR required this visit?  No   CCM required this visit?  No      Plan:     I have personally reviewed and noted the following in the patient's chart:   Medical and social history Use of alcohol, tobacco or illicit drugs  Current medications and supplements including opioid prescriptions. Patient is not currently taking opioid prescriptions. Functional ability and status Nutritional status Physical activity Advanced directives List of other physicians Hospitalizations, surgeries, and ER visits in previous 12 months Vitals Screenings to include cognitive, depression, and falls Referrals and appointments  In addition, I have reviewed and discussed with patient certain preventive protocols, quality metrics, and best practice recommendations. A written personalized care plan for preventive services as well as general preventive health recommendations were provided to patient.     Octavio Manns   04/21/2022   Nurse Notes:   Ms. Critcher , Thank you for taking time to come for your Medicare Wellness Visit. I appreciate your ongoing commitment to your health goals. Please review the following plan we discussed and let me know if I can assist you in  the future.   These are the goals we discussed:  Goals      Increase physical activity     Patient Stated     02/24/2020, no goals     Patient Stated     04/13/2021, eat healthier     retire     Wants to retire at the end of the year.         This is a list of the screening recommended for you and due dates:  Health Maintenance  Topic Date Due   COVID-19 Vaccine (5 - Moderna risk series) 11/16/2020   Yearly kidney health urinalysis for diabetes  05/08/2022   Complete foot exam   05/08/2022   Hemoglobin A1C  09/12/2022   Mammogram  11/21/2022   Eye exam for diabetics  11/21/2022   Yearly kidney function blood test for diabetes  03/13/2023   Tetanus Vaccine  09/15/2028   Colon Cancer Screening  05/02/2031   Pneumonia Vaccine  Completed   Flu Shot  Completed   DEXA scan (bone density measurement)  Completed   Hepatitis C Screening: USPSTF Recommendation to screen - Ages 82-79 yo.  Completed   Zoster (Shingles) Vaccine  Completed   HPV Vaccine  Aged Out

## 2022-05-02 ENCOUNTER — Other Ambulatory Visit: Payer: Self-pay

## 2022-05-02 ENCOUNTER — Encounter: Payer: Self-pay | Admitting: Internal Medicine

## 2022-05-02 MED ORDER — OZEMPIC (1 MG/DOSE) 4 MG/3ML ~~LOC~~ SOPN: 1.0000 mg | PEN_INJECTOR | SUBCUTANEOUS | 3 refills | Status: DC

## 2022-05-10 ENCOUNTER — Ambulatory Visit: Payer: Medicare Other

## 2022-05-10 ENCOUNTER — Ambulatory Visit: Payer: Medicare Other | Admitting: Internal Medicine

## 2022-05-16 ENCOUNTER — Encounter: Payer: Medicare Other | Admitting: Internal Medicine

## 2022-05-25 ENCOUNTER — Telehealth: Payer: Self-pay

## 2022-05-28 ENCOUNTER — Other Ambulatory Visit: Payer: Self-pay

## 2022-05-28 ENCOUNTER — Encounter: Payer: Self-pay | Admitting: Internal Medicine

## 2022-05-28 ENCOUNTER — Ambulatory Visit (INDEPENDENT_AMBULATORY_CARE_PROVIDER_SITE_OTHER): Payer: Medicare Other | Admitting: Internal Medicine

## 2022-05-28 VITALS — BP 120/80 | HR 86 | Temp 98.1°F | Ht 66.0 in | Wt 168.2 lb

## 2022-05-28 DIAGNOSIS — E663 Overweight: Secondary | ICD-10-CM

## 2022-05-28 DIAGNOSIS — R519 Headache, unspecified: Secondary | ICD-10-CM | POA: Diagnosis not present

## 2022-05-28 DIAGNOSIS — Z0000 Encounter for general adult medical examination without abnormal findings: Secondary | ICD-10-CM | POA: Diagnosis not present

## 2022-05-28 DIAGNOSIS — E1169 Type 2 diabetes mellitus with other specified complication: Secondary | ICD-10-CM | POA: Diagnosis not present

## 2022-05-28 DIAGNOSIS — I152 Hypertension secondary to endocrine disorders: Secondary | ICD-10-CM

## 2022-05-28 DIAGNOSIS — E785 Hyperlipidemia, unspecified: Secondary | ICD-10-CM

## 2022-05-28 DIAGNOSIS — E1159 Type 2 diabetes mellitus with other circulatory complications: Secondary | ICD-10-CM

## 2022-05-28 DIAGNOSIS — Z6827 Body mass index (BMI) 27.0-27.9, adult: Secondary | ICD-10-CM

## 2022-05-28 DIAGNOSIS — H9313 Tinnitus, bilateral: Secondary | ICD-10-CM

## 2022-05-28 DIAGNOSIS — E559 Vitamin D deficiency, unspecified: Secondary | ICD-10-CM

## 2022-05-28 LAB — POCT URINALYSIS DIPSTICK
Bilirubin, UA: NEGATIVE
Blood, UA: NEGATIVE
Glucose, UA: POSITIVE — AB
Ketones, UA: NEGATIVE
Nitrite, UA: NEGATIVE
Protein, UA: NEGATIVE
Spec Grav, UA: <=1.005 — AB (ref 1.010–1.025)
Urobilinogen, UA: 0.2 E.U./dL
pH, UA: 5.5 (ref 5.0–8.0)

## 2022-05-28 MED ORDER — XIGDUO XR 10-1000 MG PO TB24: 1.0000 | ORAL_TABLET | Freq: Every day | ORAL | 1 refills | Status: DC

## 2022-05-28 MED ORDER — ICOSAPENT ETHYL 1 G PO CAPS: 1.0000 g | ORAL_CAPSULE | Freq: Every day | ORAL | 1 refills | Status: DC

## 2022-05-28 NOTE — Patient Instructions (Signed)

## 2022-05-28 NOTE — Progress Notes (Signed)
Barnet Glasgow Martin,acting as a Education administrator for [name], MD.,have documented all relevant documentation on the behalf of [name], MD,as directed by  [name], MD while in the presence of [name], MD.   Subjective:     Patient ID: Michele Pope , female    DOB: Dec 02, 1950 , 71 y.o.   MRN: [id number]   Chief Complaint  Patient presents with   Annual Exam    HPI  Patient presents today for HM.  She is no longer followed by GYN for her pelvic exams.  She reports compliance with medications and has no other issues at this time. She denies headaches, chest pain and shortness of breath.  She admits that she did not take her BP meds prior to leaving home, she just took while on her way driving here from [place], [place].     BP Readings from Last 3 Encounters: 05/28/22 : (!) 140/70 03/12/22 : 110/78 01/09/22 : 108/80    Diabetes She presents for her follow-up diabetic visit. She has type 2 diabetes mellitus. There are no hypoglycemic associated symptoms. Pertinent negatives for diabetes include no blurred vision, no chest pain and no weakness. There are no hypoglycemic complications. Risk factors for coronary artery disease include diabetes mellitus, dyslipidemia, hypertension and post-menopausal. She is following a diabetic diet. She participates in exercise intermittently. There is no change in her home blood glucose trend. An ACE inhibitor/angiotensin II receptor blocker is being taken.  Hypertension This is a chronic problem. The current episode started more than 1 year ago. The problem has been gradually improving since onset. The problem is controlled. Pertinent negatives include no blurred vision, chest pain, palpitations or shortness of breath. The current treatment provides moderate improvement.     Past Medical History:  Diagnosis Date   Diabetes mellitus without complication (HCC)    High cholesterol    HTN (hypertension)      Family History  Problem  Relation Age of Onset   Heart attack Mother    Blindness Father      Current Outpatient Medications:    Blood Glucose Monitoring Suppl (ONE TOUCH ULTRA 2) w/Device KIT, Use as directed to check blood sugars 1 time per day dx: e11.22, Disp: 1 kit, Rfl: 1   Cholecalciferol (VITAMIN D3) 125 MCG (5000 UT) CAPS, Take by mouth., Disp: , Rfl:    Cyanocobalamin ([employer]S VITAMIN B12 PO), Take 1 tablet by mouth daily at 2 am., Disp: , Rfl:    Dapagliflozin Pro-metFORMIN ER (XIGDUO XR) 04-999 MG TB24, Take 1 tablet by mouth daily., Disp: 90 tablet, Rfl: 1   diclofenac sodium (VOLTAREN) 1 % GEL, Apply topically 4 (four) times daily., Disp: , Rfl:    ezetimibe (ZETIA) 10 MG tablet, TAKE 1 TABLET BY MOUTH EVERY DAY, Disp: 90 tablet, Rfl: 1   glucose blood (ONE TOUCH ULTRA TEST) test strip, Use as instructed to check blood sugars 1 time per day dx: e11.65, Disp: 100 each, Rfl: 11   lisinopril (ZESTRIL) 10 MG tablet, TAKE 1 TABLET BY MOUTH EVERY DAY, Disp: 90 tablet, Rfl: 1   loratadine (CLARITIN) 10 MG tablet, Take 1 tablet (10 mg total) by mouth daily., Disp: 90 tablet, Rfl: 1   Misc Natural Products (NEURIVA PO), Take by mouth. 1 per day, Disp: , Rfl:    mometasone (NASONEX) 50 MCG/ACT nasal spray, SPRAY 2 SPRAYS INTO EACH NOSTRIL EVERY DAY, Disp: 51 each, Rfl: 1   omeprazole (PRILOSEC) 40 MG capsule, Take 1  capsule (40 mg total) by mouth daily., Disp: 90 capsule, Rfl: 0   Semaglutide, 1 MG/DOSE, (OZEMPIC, 1 MG/DOSE,) 4 MG/3ML SOPN, Inject 1 mg into the skin once a week., Disp: 9 mL, Rfl: 3   icosapent Ethyl (VASCEPA) 1 g capsule, Take 1 capsule (1 g total) by mouth daily. 1 capsule daily, Disp: 90 capsule, Rfl: 1   Allergies  Allergen Reactions   Meloxicam Diarrhea   Pitavastatin       The patient states she uses post menopausal status for birth control. Last LMP was No LMP recorded. Patient is postmenopausal.. Negative for Dysmenorrhea. Negative for: breast discharge, breast lump(s), breast pain and  breast self exam. Associated symptoms include abnormal vaginal bleeding. Pertinent negatives include abnormal bleeding (hematology), anxiety, decreased libido, depression, difficulty falling sleep, dyspareunia, history of infertility, nocturia, sexual dysfunction, sleep disturbances, urinary incontinence, urinary urgency, vaginal discharge and vaginal itching. Diet regular.The patient states her exercise level is  moderate.  . The patient's tobacco use is:  Social History   Tobacco Use  Smoking Status Never  Smokeless Tobacco Never  . She has been exposed to passive smoke. The patient's alcohol use is:  Social History   Substance and Sexual Activity  Alcohol Use Never    Review of Systems  Constitutional: Negative.   HENT: Negative.    Eyes: Negative.  Negative for blurred vision.  Respiratory: Negative.  Negative for shortness of breath.   Cardiovascular: Negative.  Negative for chest pain and palpitations.  Gastrointestinal: Negative.   Endocrine: Negative.   Genitourinary: Negative.   Musculoskeletal: Negative.   Skin: Negative.   Allergic/Immunologic: Negative.   Neurological: Negative.  Negative for weakness.  Hematological: Negative.   Psychiatric/Behavioral: Negative.    All other systems reviewed and are negative.    Today's Vitals   05/28/22 0956 05/28/22 1011  BP: (!) 140/70 120/80  Pulse: 86   Temp: 98.1 F (36.7 C)   TempSrc: Oral   Weight: 168 lb 3.2 oz (76.3 kg)   Height: 5' 6" (1.676 m)   PainSc: 0-No pain    Body mass index is 27.15 kg/m.  Wt Readings from Last 3 Encounters:  05/28/22 168 lb 3.2 oz (76.3 kg)  03/12/22 179 lb 6.4 oz (81.4 kg)  01/09/22 189 lb 3.2 oz (85.8 kg)    Objective:  Physical Exam Vitals and nursing note reviewed.  Constitutional:      Appearance: Normal appearance.  HENT:     Head: Normocephalic and atraumatic.     Right Ear: Tympanic membrane, ear canal and external ear normal.     Left Ear: Tympanic membrane, ear  canal and external ear normal.     Nose:     Comments: MASKED    Mouth/Throat:     Comments: MASKED  Eyes:     Extraocular Movements: Extraocular movements intact.     Conjunctiva/sclera: Conjunctivae normal.     Pupils: Pupils are equal, round, and reactive to light.  Cardiovascular:     Rate and Rhythm: Normal rate and regular rhythm.     Pulses: Normal pulses.          Dorsalis pedis pulses are 2+ on the right side and 2+ on the left side.     Heart sounds: Normal heart sounds.  Pulmonary:     Effort: Pulmonary effort is normal.     Breath sounds: Normal breath sounds.  Chest:  Breasts:    Tanner Score is 5.     Right: Normal.  Left: Normal.  Abdominal:     General: Abdomen is flat. Bowel sounds are normal.     Palpations: Abdomen is soft.  Genitourinary:    Comments: deferred Musculoskeletal:        General: Normal range of motion.     Cervical back: Normal range of motion and neck supple.  Feet:     Right foot:     Protective Sensation: 5 sites tested.  5 sites sensed.     Skin integrity: Skin integrity normal.     Toenail Condition: Right toenails are normal.     Left foot:     Protective Sensation: 5 sites tested.  5 sites sensed.     Skin integrity: Skin integrity normal.     Toenail Condition: Left toenails are normal.  Skin:    General: Skin is warm and dry.  Neurological:     General: No focal deficit present.     Mental Status: She is alert and oriented to person, place, and time.  Psychiatric:        Mood and Affect: Mood normal.        Behavior: Behavior normal.      Assessment And Plan:     1. Annual physical exam Comments: A full exam was performed. Importance of monthly self breast exams was discussed with the patient.  PATIENT IS ADVISED TO GET 30-45 MINUTES REGULAR EXERCISE NO LESS THAN FOUR TO FIVE DAYS PER WEEK - BOTH WEIGHTBEARING EXERCISES AND AEROBIC ARE RECOMMENDED.  PATIENT IS ADVISED TO FOLLOW A HEALTHY DIET WITH AT LEAST SIX  FRUITS/VEGGIES PER DAY, DECREASE INTAKE OF RED MEAT, AND TO INCREASE FISH INTAKE TO TWO DAYS PER WEEK.  MEATS/FISH SHOULD NOT BE FRIED, BAKED OR BROILED IS PREFERABLE.  IT IS ALSO IMPORTANT TO CUT BACK ON YOUR SUGAR INTAKE. PLEASE AVOID ANYTHING WITH ADDED SUGAR, CORN SYRUP OR OTHER SWEETENERS. IF YOU MUST USE A SWEETENER, YOU CAN TRY STEVIA. IT IS ALSO IMPORTANT TO AVOID ARTIFICIALLY SWEETENERS AND DIET BEVERAGES. LASTLY, I SUGGEST WEARING SPF 50 SUNSCREEN ON EXPOSED PARTS AND ESPECIALLY WHEN IN THE DIRECT SUNLIGHT FOR AN EXTENDED PERIOD OF TIME.  PLEASE AVOID FAST FOOD RESTAURANTS AND INCREASE YOUR WATER INTAKE.  2. Hypertension associated with diabetes ([place]) Comments: Chronic, BP well controlled. EKG performed, NSR w/ LAFB.  She will c/w lisinopril 50m daily. She will f/u in 6 months, encouraged to follow low sodium diet. - EKG 12-Lead - CBC no Diff - Lipid panel  3. Dyslipidemia due to type 2 diabetes mellitus ([place] Comments: Chronic, diabetic foot exam was performed. She will f/u in 3-4 months for re-evaluation.  I reviewed patient's medical record, external notes, lab results, and imaging reports. As I have discussed with the patient in detail, compliance with diabetes medications, meal planning, exercise, and self-monitoring regimens are essential to improving glucose control. Target HgbA1C is 7.0%. We discussed blood sugar goals including decreasing HgbA1C to target, fasting & premeal blood sugars between 80-130, and 2 hr post meal blood sugars <160. Emphasized that for every 1% that the HgbA1C is above this target range, risk of microvascular complications, including retinopathy, nephropathy, and neuropathy increase by 30%, and risk of macrovascular disease, including heart attack, peripheral vascular disease, and stroke increased by 15%. We discussed carbohydrates and their influence on blood sugars and cited examples of common higher carb foods to include junk food, bread, rice, pasta, corn,  potatoes, fruit, and fruit juices to name a few. We discussed estimating carbohydrate portion sizes, the importance  of meal planning and balancing carbohydrate intake. The positive impact of an active lifestyle on diabetes and health explained, and goals set to incorporate more activity and exercise into weekly schedule. Hypoglycemia recognition, prevention, and treatment discussed with patient. If patient's BG is low, discussed eating 15g of carbs and waiting 15 minutes and rechecking BG, until BG is above 70. If BG doesn't improve to normal, patient is to call office or doctor on call if after hours. - Microalbumin / Creatinine Urine Ratio - POCT Urinalysis Dipstick (81002) - Hemoglobin A1c - icosapent Ethyl (VASCEPA) 1 g capsule; Take 1 capsule (1 g total) by mouth daily. 1 capsule daily  Dispense: 90 capsule; Refill: 1 - BMP8+eGFR  4. Sinus headache Comments: She is currently taking loratadine, I do not recommend decongestants. Therefore, she will try Zyrtec 56m nightly. Seh will let me know if her sx persist.  5. Tinnitus of both ears Comments: She has been evaluated by ENT, I will request her records and recent CT scan.  6. Vitamin D deficiency disease Comments: I will check a vitamin D level and supplement as needed. - Vitamin D (25 hydroxy)  7. Overweight with body mass index (BMI) of 27 to 27.9 in adult Comments: She is encouraged to aim for at least 150 minutes of exercise per week.  Patient was given opportunity to ask questions. Patient verbalized understanding of the plan and was able to repeat key elements of the plan. All questions were answered to their satisfaction.   I, RMaximino Greenland MD, have reviewed all documentation for this visit. The documentation on 05/28/22 for the exam, diagnosis, procedures, and orders are all accurate and complete.   THE PATIENT IS ENCOURAGED TO PRACTICE SOCIAL DISTANCING DUE TO THE COVID-19 PANDEMIC.

## 2022-05-30 LAB — HEMOGLOBIN A1C
Est. average glucose Bld gHb Est-mCnc: 108 mg/dL
Hgb A1c MFr Bld: 5.4 % (ref 4.8–5.6)

## 2022-05-30 LAB — CBC
Hematocrit: 43.4 % (ref 34.0–46.6)
Hemoglobin: 14.7 g/dL (ref 11.1–15.9)
MCH: 26.7 pg (ref 26.6–33.0)
MCHC: 33.9 g/dL (ref 31.5–35.7)
MCV: 79 fL (ref 79–97)
Platelets: 293 10*3/uL (ref 150–450)
RBC: 5.51 x10E6/uL — ABNORMAL HIGH (ref 3.77–5.28)
RDW: 15.3 % (ref 11.7–15.4)
WBC: 5.2 10*3/uL (ref 3.4–10.8)

## 2022-05-30 LAB — MICROALBUMIN / CREATININE URINE RATIO
Creatinine, Urine: 30.4 mg/dL
Microalb/Creat Ratio: <10 mg/g creat (ref 0–29)
Microalbumin, Urine: <3 ug/mL

## 2022-05-30 LAB — BMP8+EGFR
BUN/Creatinine Ratio: 11 — ABNORMAL LOW (ref 12–28)
BUN: 10 mg/dL (ref 8–27)
CO2: 21 mmol/L (ref 20–29)
Calcium: 10 mg/dL (ref 8.7–10.3)
Chloride: 102 mmol/L (ref 96–106)
Creatinine, Ser: 0.9 mg/dL (ref 0.57–1.00)
Glucose: 80 mg/dL (ref 70–99)
Potassium: 4.6 mmol/L (ref 3.5–5.2)
Sodium: 140 mmol/L (ref 134–144)
eGFR: 69 mL/min/{1.73_m2} (ref >59)

## 2022-05-30 LAB — LIPID PANEL
Chol/HDL Ratio: 2.8 ratio (ref 0.0–4.4)
Cholesterol, Total: 133 mg/dL (ref 100–199)
HDL: 48 mg/dL (ref >39)
LDL Chol Calc (NIH): 67 mg/dL (ref 0–99)
Triglycerides: 95 mg/dL (ref 0–149)
VLDL Cholesterol Cal: 18 mg/dL (ref 5–40)

## 2022-05-30 LAB — VITAMIN D 25 HYDROXY (VIT D DEFICIENCY, FRACTURES): Vit D, 25-Hydroxy: 34.4 ng/mL (ref 30.0–100.0)

## 2022-05-31 ENCOUNTER — Encounter: Payer: Medicare Other | Admitting: Internal Medicine

## 2022-06-02 NOTE — Telephone Encounter (Signed)
error 

## 2022-06-03 ENCOUNTER — Encounter: Payer: Self-pay | Admitting: Internal Medicine

## 2022-06-18 ENCOUNTER — Other Ambulatory Visit: Payer: Self-pay | Admitting: Internal Medicine

## 2022-08-10 LAB — HM COLONOSCOPY

## 2022-08-17 ENCOUNTER — Encounter: Payer: Self-pay | Admitting: Internal Medicine

## 2022-08-24 ENCOUNTER — Other Ambulatory Visit: Payer: Self-pay | Admitting: Internal Medicine

## 2022-08-28 ENCOUNTER — Encounter: Payer: Self-pay | Admitting: Internal Medicine

## 2022-08-28 ENCOUNTER — Ambulatory Visit (INDEPENDENT_AMBULATORY_CARE_PROVIDER_SITE_OTHER): Payer: Medicare Other | Admitting: Internal Medicine

## 2022-08-28 VITALS — BP 128/86 | HR 84 | Temp 98.2°F | Ht 66.0 in | Wt 161.4 lb

## 2022-08-28 DIAGNOSIS — I152 Hypertension secondary to endocrine disorders: Secondary | ICD-10-CM | POA: Diagnosis not present

## 2022-08-28 DIAGNOSIS — E559 Vitamin D deficiency, unspecified: Secondary | ICD-10-CM

## 2022-08-28 DIAGNOSIS — E785 Hyperlipidemia, unspecified: Secondary | ICD-10-CM

## 2022-08-28 DIAGNOSIS — E1169 Type 2 diabetes mellitus with other specified complication: Secondary | ICD-10-CM

## 2022-08-28 DIAGNOSIS — K5909 Other constipation: Secondary | ICD-10-CM

## 2022-08-28 DIAGNOSIS — E1159 Type 2 diabetes mellitus with other circulatory complications: Secondary | ICD-10-CM | POA: Diagnosis not present

## 2022-08-28 DIAGNOSIS — Z6826 Body mass index (BMI) 26.0-26.9, adult: Secondary | ICD-10-CM

## 2022-08-28 NOTE — Patient Instructions (Addendum)
Magnesium glycinate, one capsule nightly  Type 2 Diabetes Mellitus, Diagnosis, Adult Type 2 diabetes (type 2 diabetes mellitus) is a long-term (chronic) disease. It may happen when there is one or both of these problems: The pancreas does not make enough insulin. The body does not react in a normal way to insulin that it makes. Insulin lets sugars go into cells in your body. If you have type 2 diabetes, sugars cannot get into your cells. Sugars build up in the blood. This causes high blood sugar. What are the causes? The exact cause of this condition is not known. What increases the risk? Having type 2 diabetes in your family. Being overweight or very overweight. Not being active. Your body not reacting in a normal way to the insulin it makes. Having higher than normal blood sugar over time. Having a type of diabetes when you were pregnant. Having a condition that causes small fluid-filled sacs on your ovaries. What are the signs or symptoms? At first, you may have no symptoms. You will get symptoms slowly. They may include: More thirst than normal. More hunger than normal. Needing to pee more than normal. Losing weight without trying. Feeling tired. Feeling weak. Seeing things blurry. Dark patches on your skin. How is this treated? This condition may be treated by a diabetes expert. You may need to: Follow an eating plan made by a food expert (dietitian). Get regular exercise. Find ways to deal with stress. Check blood sugar as often as told. Take medicines. Your doctor will set treatment goals for you. Your blood sugar should be at these levels: Before meals: 80-130 mg/dL (4.4-7.2 mmol/L). After meals: below 180 mg/dL (10 mmol/L). Over the last 2-3 months: less than 7%. Follow these instructions at home: Medicines Take your diabetes medicines or insulin every day. Take medicines as told to help you prevent other problems caused by this condition. You may  need: Aspirin. Medicine to lower cholesterol. Medicine to control blood pressure. Questions to ask your doctor Should I meet with a diabetes educator? What medicines do I need, and when should I take them? What will I need to treat my condition at home? When should I check my blood sugar? Where can I find a support group? Who can I call if I have questions? When is my next doctor visit? General instructions Take over-the-counter and prescription medicines only as told by your doctor. Keep all follow-up visits. Where to find more information For help and guidance and more information about diabetes, please go to: American Diabetes Association (ADA): www.diabetes.org American Association of Diabetes Care and Education Specialists (ADCES): www.diabeteseducator.org International Diabetes Federation (IDF): MemberVerification.ca Contact a doctor if: Your blood sugar is at or above 240 mg/dL (13.3 mmol/L) for 2 days in a row. You have been sick for 2 days or more, and you are not getting better. You have had a fever for 2 days or more, and you are not getting better. You have any of these problems for more than 6 hours: You cannot eat or drink. You feel like you may vomit. You vomit. You have watery poop (diarrhea). Get help right away if: Your blood sugar is lower than 54 mg/dL (3 mmol/L). You feel mixed up (confused). You have trouble thinking clearly. You have trouble breathing. You have medium or large ketone levels in your pee. These symptoms may be an emergency. Get help right away. Call your local emergency services (911 in the [place] Do not wait to see if the symptoms will  go away. Do not drive yourself to the hospital. Summary Type 2 diabetes is a long-term disease. Your pancreas may not make enough insulin, or your body may not react in a normal way to insulin that it makes. This condition is treated with an eating plan, lifestyle changes, and medicines. Your doctor will set  treatment goals for you. These will help you keep your blood sugar in a healthy range. Keep all follow-up visits. This information is not intended to replace advice given to you by your health care provider. Make sure you discuss any questions you have with your health care provider. Document Revised: 10/03/2020 Document Reviewed: 10/03/2020 Elsevier Patient Education  [employer].

## 2022-08-28 NOTE — Progress Notes (Signed)
I,Michele Pope,acting as a scribe for Michele Greenland, MD.,have documented all relevant documentation on the behalf of Michele Greenland, MD,as directed by  Michele Greenland, MD while in the presence of Michele Greenland, MD.    Subjective:     Patient ID: Michele Pope , female    DOB: 1950-11-17 , 72 y.o.   MRN: [id number]   Chief Complaint  Patient presents with   Diabetes   Hypertension    HPI  The patient is here today for a diabetes and HTN f/u.  She reports compliance with meds. She denies headaches, chest pain and shortness of breath.   She reports being under a lot of stress. She has close friends who aren't in good health and she is concerned.     Diabetes She presents for her follow-up diabetic visit. She has type 2 diabetes mellitus. There are no hypoglycemic associated symptoms. Pertinent negatives for diabetes include no blurred vision, no chest pain and no weakness. There are no hypoglycemic complications. Risk factors for coronary artery disease include diabetes mellitus, dyslipidemia, hypertension and post-menopausal.  Hypertension This is a chronic problem. The current episode started more than 1 year ago. The problem has been gradually improving since onset. The problem is controlled. Pertinent negatives include no blurred vision, chest pain, palpitations or shortness of breath.     Past Medical History:  Diagnosis Date   Diabetes mellitus without complication (HCC)    High cholesterol    HTN (hypertension)      Family History  Problem Relation Age of Onset   Heart attack Mother    Blindness Father      Current Outpatient Medications:    Blood Glucose Monitoring Suppl (ONE TOUCH ULTRA 2) w/Device KIT, Use as directed to check blood sugars 1 time per day dx: e11.22, Disp: 1 kit, Rfl: 1   Cholecalciferol (VITAMIN D3) 125 MCG (5000 UT) CAPS, Take by mouth., Disp: , Rfl:    Cyanocobalamin ([employer] VITAMIN B12 PO), Take 1 tablet by mouth daily at 2 am., Disp:  , Rfl:    Dapagliflozin Pro-metFORMIN ER (XIGDUO XR) 04-999 MG TB24, Take 1 tablet by mouth daily., Disp: 90 tablet, Rfl: 1   diclofenac sodium (VOLTAREN) 1 % GEL, Apply topically 4 (four) times daily., Disp: , Rfl:    ezetimibe (ZETIA) 10 MG tablet, TAKE 1 TABLET BY MOUTH EVERY DAY, Disp: 90 tablet, Rfl: 1   glucose blood (ONE TOUCH ULTRA TEST) test strip, Use as instructed to check blood sugars 1 time per day dx: e11.65, Disp: 100 each, Rfl: 11   icosapent Ethyl (VASCEPA) 1 g capsule, Take 1 capsule (1 g total) by mouth daily. 1 capsule daily, Disp: 90 capsule, Rfl: 1   lisinopril (ZESTRIL) 10 MG tablet, TAKE 1 TABLET BY MOUTH EVERY DAY, Disp: 90 tablet, Rfl: 1   loratadine (CLARITIN) 10 MG tablet, Take 1 tablet (10 mg total) by mouth daily., Disp: 90 tablet, Rfl: 1   Misc Natural Products (NEURIVA PO), Take by mouth. 1 per day, Disp: , Rfl:    mometasone (NASONEX) 50 MCG/ACT nasal spray, SPRAY 2 SPRAYS INTO EACH NOSTRIL EVERY DAY, Disp: 51 each, Rfl: 1   omeprazole (PRILOSEC) 40 MG capsule, TAKE 1 CAPSULE (40 MG TOTAL) BY MOUTH DAILY., Disp: 90 capsule, Rfl: 0   Semaglutide, 1 MG/DOSE, (OZEMPIC, 1 MG/DOSE,) 4 MG/3ML SOPN, Inject 1 mg into the skin once a week., Disp: 9 mL, Rfl: 3   Allergies  Allergen Reactions  Meloxicam Diarrhea   Pitavastatin      Review of Systems  Constitutional: Negative.   Eyes:  Negative for blurred vision.  Respiratory: Negative.  Negative for shortness of breath.   Cardiovascular: Negative.  Negative for chest pain and palpitations.  Gastrointestinal:  Positive for constipation.       She has been evaluated by GI, colonoscopy earlier this year. Trulance makes her nauseated. Linzess was ineffective.   Neurological: Negative.  Negative for weakness.  Psychiatric/Behavioral: Negative.       Today's Vitals   08/28/22 1018 08/28/22 1046  BP: (!) 130/92 128/86  Pulse: 84   Temp: 98.2 F (36.8 C)   SpO2: 98%   Weight: 161 lb 6.4 oz (73.2 kg)   Height: 5'  6" (1.676 m)    Body mass index is 26.05 kg/m.  Wt Readings from Last 3 Encounters:  08/28/22 161 lb 6.4 oz (73.2 kg)  05/28/22 168 lb 3.2 oz (76.3 kg)  03/12/22 179 lb 6.4 oz (81.4 kg)    Objective:  Physical Exam Vitals and nursing note reviewed.  Constitutional:      Appearance: Normal appearance.  HENT:     Head: Normocephalic and atraumatic.     Nose:     Comments: Masked     Mouth/Throat:     Comments: Masked  Eyes:     Extraocular Movements: Extraocular movements intact.  Cardiovascular:     Rate and Rhythm: Normal rate and regular rhythm.     Heart sounds: Normal heart sounds.  Pulmonary:     Effort: Pulmonary effort is normal.     Breath sounds: Normal breath sounds.  Musculoskeletal:     Cervical back: Normal range of motion.  Skin:    General: Skin is warm.  Neurological:     General: No focal deficit present.     Mental Status: She is alert.  Psychiatric:        Mood and Affect: Mood normal.        Behavior: Behavior normal.      Assessment And Plan:     1. Dyslipidemia due to type 2 diabetes mellitus ([place]) Comments: Chronic, I will check meds as below. We did discuss decreasing dose of Ozempic. I will check lbas and make further recommendations once her labs are available. - CMP14+EGFR - Hemoglobin A1c  2. Hypertension associated with diabetes ([place]) Comments: Chronic, fair control. Goal BP<120/80. She is encouraged to follow low sodium diet. - CMP14+EGFR - Hemoglobin A1c  3. Chronic constipation Comments: She is advised to resume Trulance and use along with Miralax daily. She may also need to use suppository once weekly.  4. Body mass index (BMI) 26.0-26.9, adult Comments: She is encouraged to aim for at least 150 minutes of exercise/week.   Patient was given opportunity to ask questions. Patient verbalized understanding of the plan and was able to repeat key elements of the plan. All questions were answered to their satisfaction.   I, Michele Greenland, MD, have reviewed all documentation for this visit. The documentation on 08/28/22 for the exam, diagnosis, procedures, and orders are all accurate and complete.   IF YOU HAVE BEEN REFERRED TO A SPECIALIST, IT MAY TAKE 1-2 WEEKS TO SCHEDULE/PROCESS THE REFERRAL. IF YOU HAVE NOT HEARD FROM US/SPECIALIST IN TWO WEEKS, PLEASE GIVE [place] A CALL AT [phone].   THE PATIENT IS ENCOURAGED TO PRACTICE SOCIAL DISTANCING DUE TO THE COVID-19 PANDEMIC.

## 2022-08-29 LAB — CMP14+EGFR
ALT: 13 IU/L (ref 0–32)
AST: 26 IU/L (ref 0–40)
Albumin/Globulin Ratio: 1.3 (ref 1.2–2.2)
Albumin: 4.2 g/dL (ref 3.9–4.9)
Alkaline Phosphatase: 96 IU/L (ref 44–121)
BUN/Creatinine Ratio: 12 (ref 12–28)
BUN: 10 mg/dL (ref 8–27)
Bilirubin Total: 0.8 mg/dL (ref 0.0–1.2)
CO2: 24 mmol/L (ref 20–29)
Calcium: 9.9 mg/dL (ref 8.7–10.3)
Chloride: 102 mmol/L (ref 96–106)
Creatinine, Ser: 0.84 mg/dL (ref 0.57–1.00)
Globulin, Total: 3.2 g/dL (ref 1.5–4.5)
Glucose: 84 mg/dL (ref 70–99)
Potassium: 4.3 mmol/L (ref 3.5–5.2)
Sodium: 140 mmol/L (ref 134–144)
Total Protein: 7.4 g/dL (ref 6.0–8.5)
eGFR: 75 mL/min/{1.73_m2} (ref >59)

## 2022-08-29 LAB — HEMOGLOBIN A1C
Est. average glucose Bld gHb Est-mCnc: 114 mg/dL
Hgb A1c MFr Bld: 5.6 % (ref 4.8–5.6)

## 2022-08-30 ENCOUNTER — Encounter: Payer: Self-pay | Admitting: Internal Medicine

## 2022-09-01 DIAGNOSIS — E663 Overweight: Secondary | ICD-10-CM | POA: Insufficient documentation

## 2022-09-01 DIAGNOSIS — Z6826 Body mass index (BMI) 26.0-26.9, adult: Secondary | ICD-10-CM | POA: Insufficient documentation

## 2022-09-11 ENCOUNTER — Telehealth: Payer: Medicare Other | Admitting: Physician Assistant

## 2022-09-11 DIAGNOSIS — B3731 Acute candidiasis of vulva and vagina: Secondary | ICD-10-CM

## 2022-09-12 MED ORDER — FLUCONAZOLE 150 MG PO TABS: 150.0000 mg | ORAL_TABLET | ORAL | 0 refills | Status: DC | PRN

## 2022-09-12 NOTE — Progress Notes (Signed)
E-Visit for Vaginal Symptoms  We are sorry that you are not feeling well. Here is how we plan to help! Based on what you shared with me it looks like you: May have a yeast vaginosis  Vaginosis is an inflammation of the vagina that can result in discharge, itching and pain. The cause is usually a change in the normal balance of vaginal bacteria or an infection. Vaginosis can also result from reduced estrogen levels after menopause.  The most common causes of vaginosis are:   Bacterial vaginosis which results from an overgrowth of one on several organisms that are normally present in your vagina.   Yeast infections which are caused by a naturally occurring fungus called candida.   Vaginal atrophy (atrophic vaginosis) which results from the thinning of the vagina from reduced estrogen levels after menopause.   Trichomoniasis which is caused by a parasite and is commonly transmitted by sexual intercourse.  Factors that increase your risk of developing vaginosis include: Medications, such as antibiotics and steroids Uncontrolled diabetes Use of hygiene products such as bubble bath, vaginal spray or vaginal deodorant Douching Wearing damp or tight-fitting clothing Using an intrauterine device (IUD) for birth control Hormonal changes, such as those associated with pregnancy, birth control pills or menopause Sexual activity Having a sexually transmitted infection  Your treatment plan is Diflucan (fluconazole) 161m tablet once, repeat in 72 hours if needed.  I have electronically sent this prescription into the pharmacy that you have chosen.  Be sure to take all of the medication as directed. Stop taking any medication if you develop a rash, tongue swelling or shortness of breath. Mothers who are breast feeding should consider pumping and discarding their breast milk while on these antibiotics. However, there is no consensus that infant exposure at these doses would be harmful.  Remember that  medication creams can weaken latex condoms. .Marland Kitchen  HOME CARE:  Good hygiene may prevent some types of vaginosis from recurring and may relieve some symptoms:  Avoid baths, hot tubs and whirlpool spas. Rinse soap from your outer genital area after a shower, and dry the area well to prevent irritation. Don't use scented or harsh soaps, such as those with deodorant or antibacterial action. Avoid irritants. These include scented tampons and pads. Wipe from front to back after using the toilet. Doing so avoids spreading fecal bacteria to your vagina.  Other things that may help prevent vaginosis include:  Don't douche. Your vagina doesn't require cleansing other than normal bathing. Repetitive douching disrupts the normal organisms that reside in the vagina and can actually increase your risk of vaginal infection. Douching won't clear up a vaginal infection. Use a latex condom. Both female and female latex condoms may help you avoid infections spread by sexual contact. Wear cotton underwear. Also wear pantyhose with a cotton crotch. If you feel comfortable without it, skip wearing underwear to bed. Yeast thrives in mCampbell SoupYour symptoms should improve in the next day or two.  GET HELP RIGHT AWAY IF:  You have pain in your lower abdomen ( pelvic area or over your ovaries) You develop nausea or vomiting You develop a fever Your discharge changes or worsens You have persistent pain with intercourse You develop shortness of breath, a rapid pulse, or you faint.  These symptoms could be signs of problems or infections that need to be evaluated by a medical provider now.  MAKE SURE YOU   Understand these instructions. Will watch your condition. Will get help right away  if you are not doing well or get worse.  Thank you for choosing an e-visit.  Your e-visit answers were reviewed by a board certified advanced clinical practitioner to complete your personal care plan. Depending upon the  condition, your plan could have included both over the counter or prescription medications.  Please review your pharmacy choice. Make sure the pharmacy is open so you can pick up prescription now. If there is a problem, you may contact your provider through [employer] and have the prescription routed to another pharmacy.  Your safety is important to [place]. If you have drug allergies check your prescription carefully.   For the next 24 hours you can use MyChart to ask questions about today's visit, request a non-urgent call back, or ask for a work or school excuse. You will get an email in the next two days asking about your experience. I hope that your e-visit has been valuable and will speed your recovery.  I have spent 5 minutes in review of e-visit questionnaire, review and updating patient chart, medical decision making and response to patient.   Mar Daring, PA-C

## 2022-09-21 ENCOUNTER — Other Ambulatory Visit: Payer: Self-pay

## 2022-09-21 ENCOUNTER — Encounter: Payer: Self-pay | Admitting: Internal Medicine

## 2022-09-21 MED ORDER — EZETIMIBE 10 MG PO TABS: 10.0000 mg | ORAL_TABLET | Freq: Every day | ORAL | 1 refills | Status: DC

## 2022-10-29 ENCOUNTER — Other Ambulatory Visit: Payer: Self-pay | Admitting: Internal Medicine

## 2022-10-29 MED ORDER — OMEPRAZOLE 40 MG PO CPDR: 40.0000 mg | DELAYED_RELEASE_CAPSULE | Freq: Every day | ORAL | 0 refills | Status: DC

## 2022-11-02 ENCOUNTER — Other Ambulatory Visit: Payer: Self-pay | Admitting: Internal Medicine

## 2022-11-02 DIAGNOSIS — E2839 Other primary ovarian failure: Secondary | ICD-10-CM

## 2022-11-05 ENCOUNTER — Ambulatory Visit
Admission: RE | Admit: 2022-11-05 | Discharge: 2022-11-05 | Disposition: A | Discharge status: Home / Self Care | Payer: Medicare Other | Source: Ambulatory Visit | Attending: Internal Medicine | Admitting: Internal Medicine

## 2022-11-05 DIAGNOSIS — E2839 Other primary ovarian failure: Secondary | ICD-10-CM

## 2022-11-22 LAB — HM MAMMOGRAPHY

## 2022-11-25 ENCOUNTER — Other Ambulatory Visit: Payer: Self-pay | Admitting: Internal Medicine

## 2022-11-25 DIAGNOSIS — E785 Hyperlipidemia, unspecified: Secondary | ICD-10-CM

## 2022-11-26 ENCOUNTER — Other Ambulatory Visit: Payer: Self-pay | Admitting: Internal Medicine

## 2022-12-14 ENCOUNTER — Encounter: Payer: Self-pay | Admitting: Internal Medicine

## 2022-12-25 ENCOUNTER — Ambulatory Visit (INDEPENDENT_AMBULATORY_CARE_PROVIDER_SITE_OTHER): Payer: Medicare Other | Admitting: Internal Medicine

## 2022-12-25 ENCOUNTER — Encounter: Payer: Self-pay | Admitting: Internal Medicine

## 2022-12-25 VITALS — BP 110/78 | HR 96 | Temp 97.7°F | Ht 66.0 in | Wt 162.2 lb

## 2022-12-25 DIAGNOSIS — E1159 Type 2 diabetes mellitus with other circulatory complications: Secondary | ICD-10-CM

## 2022-12-25 DIAGNOSIS — E1169 Type 2 diabetes mellitus with other specified complication: Secondary | ICD-10-CM | POA: Diagnosis not present

## 2022-12-25 DIAGNOSIS — E785 Hyperlipidemia, unspecified: Secondary | ICD-10-CM | POA: Diagnosis not present

## 2022-12-25 DIAGNOSIS — K5909 Other constipation: Secondary | ICD-10-CM

## 2022-12-25 DIAGNOSIS — I152 Hypertension secondary to endocrine disorders: Secondary | ICD-10-CM

## 2022-12-25 DIAGNOSIS — Z6826 Body mass index (BMI) 26.0-26.9, adult: Secondary | ICD-10-CM

## 2022-12-25 NOTE — Progress Notes (Signed)
Michele Pope,Michele Pope,acting as a scribe for [name], MD.,have documented all relevant documentation on the behalf of [name], MD,as directed by  [name], MD while in the presence of [name], MD.    Subjective:     Patient ID: Michele Pope , female    DOB: [birth year] , 72 y.o.   MRN: [id number]   Chief Complaint  Patient presents with   Diabetes   Hypertension    HPI  The patient is here today for a diabetes and HTN f/u.  She reports compliance with meds. She denies headaches, chest pain and shortness of breath.  She has no other concerns at this time.   She reports having DM eye exam scheduled for next month.       Diabetes She presents for her follow-up diabetic visit. She has type 2 diabetes mellitus. There are no hypoglycemic associated symptoms. Pertinent negatives for diabetes include no blurred vision, no chest pain and no weakness. There are no hypoglycemic complications. Risk factors for coronary artery disease include diabetes mellitus, dyslipidemia, hypertension and post-menopausal.  Hypertension This is a chronic problem. The current episode started more than 1 year ago. The problem has been gradually improving since onset. The problem is controlled. Pertinent negatives include no blurred vision, chest pain, palpitations or shortness of breath.     Past Medical History:  Diagnosis Date   Diabetes mellitus without complication (HCC)    High cholesterol    HTN (hypertension)      Family History  Problem Relation Age of Onset   Heart attack Mother    Blindness Father      Current Outpatient Medications:    Blood Glucose Monitoring Suppl (ONE TOUCH ULTRA 2) w/Device KIT, Use as directed to check blood sugars 1 time per day dx: e11.22, Disp: 1 kit, Rfl: 1   Cholecalciferol (VITAMIN D3) 125 MCG (5000 UT) CAPS, Take by mouth., Disp: , Rfl:    Cyanocobalamin ([facility] VITAMIN B12 PO), Take 1 tablet by mouth daily at 2 am., Disp: , Rfl:     Dapagliflozin Pro-metFORMIN ER (XIGDUO XR) 04-999 MG TB24, Take 1 tablet by mouth daily., Disp: 90 tablet, Rfl: 1   diclofenac sodium (VOLTAREN) 1 % GEL, Apply topically 4 (four) times daily., Disp: , Rfl:    ezetimibe (ZETIA) 10 MG tablet, Take 1 tablet (10 mg total) by mouth daily., Disp: 90 tablet, Rfl: 1   glucose blood (ONE TOUCH ULTRA TEST) test strip, Use as instructed to check blood sugars 1 time per day dx: e11.65, Disp: 100 each, Rfl: 11   icosapent Ethyl (VASCEPA) 1 g capsule, TAKE 1 CAPSULE BY MOUTH EVERY DAY, Disp: 90 capsule, Rfl: 1   lisinopril (ZESTRIL) 10 MG tablet, TAKE 1 TABLET BY MOUTH EVERY DAY, Disp: 90 tablet, Rfl: 1   loratadine (CLARITIN) 10 MG tablet, Take 1 tablet (10 mg total) by mouth daily., Disp: 90 tablet, Rfl: 1   Misc Natural Products (NEURIVA PO), Take by mouth. 1 per day, Disp: , Rfl:    mometasone (NASONEX) 50 MCG/ACT nasal spray, SPRAY 2 SPRAYS INTO EACH NOSTRIL EVERY DAY, Disp: 51 each, Rfl: 1   omeprazole (PRILOSEC) 40 MG capsule, Take 1 capsule (40 mg total) by mouth daily., Disp: 90 capsule, Rfl: 0   Semaglutide, 1 MG/DOSE, (OZEMPIC, 1 MG/DOSE,) 4 MG/3ML SOPN, Inject 1 mg into the skin once a week., Disp: 9 mL, Rfl: 3   Allergies  Allergen Reactions   Meloxicam Diarrhea  Pitavastatin      Review of Systems  Constitutional: Negative.   Eyes:  Negative for blurred vision.  Respiratory: Negative.  Negative for shortness of breath.   Cardiovascular: Negative.  Negative for chest pain and palpitations.  Gastrointestinal: Negative.   Skin: Negative.   Neurological: Negative.  Negative for weakness.  Psychiatric/Behavioral: Negative.       Today's Vitals   12/25/22 1131  BP: 110/78  Pulse: 96  Temp: 97.7 F (36.5 C)  SpO2: 98%  Weight: 162 lb 3.2 oz (73.6 kg)  Height: 5\' 6"  (1.676 m)   Wt Readings from Last 3 Encounters:  12/25/22 162 lb 3.2 oz (73.6 kg)  08/28/22 161 lb 6.4 oz (73.2 kg)  05/28/22 168 lb 3.2 oz (76.3 kg)    Body mass  index is 26.18 kg/m.  The 10-year ASCVD risk score (Arnett DK, et al., 2019) is: 13.4%   Values used to calculate the score:     Age: 41 years     Sex: Female     Is Non-Hispanic African American: Yes     Diabetic: Yes     Tobacco smoker: No     Systolic Blood Pressure: 110 mmHg     Is BP treated: Yes     HDL Cholesterol: 48 mg/dL     Total Cholesterol: 133 mg/dL  Objective:  Physical Exam Vitals and nursing note reviewed.  Constitutional:      Appearance: Normal appearance.  HENT:     Head: Normocephalic and atraumatic.  Eyes:     Extraocular Movements: Extraocular movements intact.  Cardiovascular:     Rate and Rhythm: Normal rate and regular rhythm.     Heart sounds: Normal heart sounds.  Pulmonary:     Effort: Pulmonary effort is normal.     Breath sounds: Normal breath sounds.  Musculoskeletal:     Cervical back: Normal range of motion.  Skin:    General: Skin is warm.  Neurological:     General: No focal deficit present.     Mental Status: She is alert.  Psychiatric:        Mood and Affect: Mood normal.        Behavior: Behavior normal.         Assessment And Plan:     1. Dyslipidemia due to type 2 diabetes mellitus (HCC) Comments: Chronic, LDL goal < 70.  She will c/w Xigduo 10/1000mg  and weekly semaglutide. She will f/u in 3-4 months for re-evaluation. - Hemoglobin A1c - TSH - BMP8+eGFR  2. Hypertension associated with diabetes (HCC) Comments: Chronic, BP well controlled. She will c/w lisinopril 10mg  daily. She is advised to follow a low sodium diet.  3. Body mass index (BMI) 26.0-26.9, adult Comments: She has lost 6lbs since Nov, encouraged to aim for at least 150 minutes of exercise/week. Michele Pope may need to decrease of semaglutide if she continues to lose weight.    Return if symptoms worsen or fail to improve.  Patient was given opportunity to ask questions. Patient verbalized understanding of the plan and was able to repeat key elements of the plan.  All questions were answered to their satisfaction.   Michele Pope, [name], MD, have reviewed all documentation for this visit. The documentation on 12/25/22 for the exam, diagnosis, procedures, and orders are all accurate and complete.   IF YOU HAVE BEEN REFERRED TO A SPECIALIST, IT MAY TAKE 1-2 WEEKS TO SCHEDULE/PROCESS THE REFERRAL. IF YOU HAVE NOT HEARD FROM US/SPECIALIST IN TWO WEEKS,  PLEASE GIVE [place] A CALL AT [phone].   THE PATIENT IS ENCOURAGED TO PRACTICE SOCIAL DISTANCING DUE TO THE COVID-19 PANDEMIC.

## 2022-12-25 NOTE — Patient Instructions (Signed)

## 2022-12-26 LAB — BMP8+EGFR
BUN/Creatinine Ratio: 12 (ref 12–28)
BUN: 11 mg/dL (ref 8–27)
CO2: 24 mmol/L (ref 20–29)
Calcium: 9.8 mg/dL (ref 8.7–10.3)
Chloride: 102 mmol/L (ref 96–106)
Creatinine, Ser: 0.9 mg/dL (ref 0.57–1.00)
Glucose: 65 mg/dL — ABNORMAL LOW (ref 70–99)
Potassium: 4.4 mmol/L (ref 3.5–5.2)
Sodium: 140 mmol/L (ref 134–144)
eGFR: 69 mL/min/{1.73_m2} (ref >59)

## 2022-12-26 LAB — TSH: TSH: 1.28 u[IU]/mL (ref 0.450–4.500)

## 2022-12-26 LAB — HEMOGLOBIN A1C
Est. average glucose Bld gHb Est-mCnc: 108 mg/dL
Hgb A1c MFr Bld: 5.4 % (ref 4.8–5.6)

## 2023-01-07 ENCOUNTER — Other Ambulatory Visit: Payer: Self-pay

## 2023-01-07 ENCOUNTER — Telehealth: Payer: Self-pay

## 2023-01-07 NOTE — Telephone Encounter (Signed)
Patient aware. Patient notified to decrease Ozempic to 36 clicks. Per RS

## 2023-01-14 ENCOUNTER — Other Ambulatory Visit: Payer: Self-pay

## 2023-01-14 ENCOUNTER — Encounter: Payer: Self-pay | Admitting: Internal Medicine

## 2023-01-14 MED ORDER — GLUCOSE BLOOD VI STRP: ORAL_STRIP | 11 refills | Status: DC

## 2023-01-15 ENCOUNTER — Other Ambulatory Visit: Payer: Self-pay | Admitting: Internal Medicine

## 2023-01-15 DIAGNOSIS — E785 Hyperlipidemia, unspecified: Secondary | ICD-10-CM

## 2023-01-18 ENCOUNTER — Other Ambulatory Visit: Payer: Self-pay

## 2023-01-18 DIAGNOSIS — E785 Hyperlipidemia, unspecified: Secondary | ICD-10-CM

## 2023-01-18 MED ORDER — GLUCOSE BLOOD VI STRP
ORAL_STRIP | 11 refills | Status: DC
Start: 2023-01-18 — End: 2023-01-22

## 2023-01-22 ENCOUNTER — Other Ambulatory Visit: Payer: Self-pay

## 2023-01-22 DIAGNOSIS — E1169 Type 2 diabetes mellitus with other specified complication: Secondary | ICD-10-CM

## 2023-01-22 MED ORDER — GLUCOSE BLOOD VI STRP
ORAL_STRIP | 11 refills | Status: DC
Start: 2023-01-22 — End: 2023-03-18

## 2023-01-23 ENCOUNTER — Other Ambulatory Visit: Payer: Self-pay | Admitting: Internal Medicine

## 2023-01-25 ENCOUNTER — Other Ambulatory Visit: Payer: Self-pay | Admitting: Internal Medicine

## 2023-02-22 ENCOUNTER — Other Ambulatory Visit: Payer: Self-pay | Admitting: Internal Medicine

## 2023-03-04 ENCOUNTER — Other Ambulatory Visit: Payer: Self-pay | Admitting: Internal Medicine

## 2023-03-04 LAB — HM DIABETES EYE EXAM

## 2023-03-17 ENCOUNTER — Other Ambulatory Visit: Payer: Self-pay | Admitting: Internal Medicine

## 2023-03-17 DIAGNOSIS — E1169 Type 2 diabetes mellitus with other specified complication: Secondary | ICD-10-CM

## 2023-03-20 NOTE — Patient Instructions (Signed)

## 2023-03-26 ENCOUNTER — Encounter: Payer: Self-pay | Admitting: Internal Medicine

## 2023-03-26 ENCOUNTER — Ambulatory Visit (INDEPENDENT_AMBULATORY_CARE_PROVIDER_SITE_OTHER): Payer: Medicare Other | Admitting: Internal Medicine

## 2023-03-26 VITALS — BP 120/78 | HR 75 | Ht 66.0 in | Wt 161.4 lb

## 2023-03-26 DIAGNOSIS — Z7985 Long-term (current) use of injectable non-insulin antidiabetic drugs: Secondary | ICD-10-CM

## 2023-03-26 DIAGNOSIS — E785 Hyperlipidemia, unspecified: Secondary | ICD-10-CM

## 2023-03-26 DIAGNOSIS — Z23 Encounter for immunization: Secondary | ICD-10-CM

## 2023-03-26 DIAGNOSIS — E1169 Type 2 diabetes mellitus with other specified complication: Secondary | ICD-10-CM

## 2023-03-26 DIAGNOSIS — Z6826 Body mass index (BMI) 26.0-26.9, adult: Secondary | ICD-10-CM

## 2023-03-26 DIAGNOSIS — I152 Hypertension secondary to endocrine disorders: Secondary | ICD-10-CM

## 2023-03-26 DIAGNOSIS — E1159 Type 2 diabetes mellitus with other circulatory complications: Secondary | ICD-10-CM

## 2023-03-26 DIAGNOSIS — R0982 Postnasal drip: Secondary | ICD-10-CM | POA: Diagnosis not present

## 2023-03-26 LAB — CMP14+EGFR
ALT: 18 IU/L (ref 0–32)
AST: 27 IU/L (ref 0–40)
Albumin: 4 g/dL (ref 3.9–4.9)
Alkaline Phosphatase: 98 IU/L (ref 44–121)
BUN/Creatinine Ratio: 13 (ref 12–28)
BUN: 12 mg/dL (ref 8–27)
Bilirubin Total: 0.6 mg/dL (ref 0.0–1.2)
CO2: 26 mmol/L (ref 20–29)
Calcium: 9.7 mg/dL (ref 8.7–10.3)
Chloride: 104 mmol/L (ref 96–106)
Creatinine, Ser: 0.93 mg/dL (ref 0.57–1.00)
Globulin, Total: 2.8 g/dL (ref 1.5–4.5)
Glucose: 80 mg/dL (ref 70–99)
Potassium: 4.4 mmol/L (ref 3.5–5.2)
Sodium: 140 mmol/L (ref 134–144)
Total Protein: 6.8 g/dL (ref 6.0–8.5)
eGFR: 67 mL/min/{1.73_m2} (ref >59)

## 2023-03-26 LAB — CBC
Hematocrit: 43.3 % (ref 34.0–46.6)
Hemoglobin: 14.4 g/dL (ref 11.1–15.9)
MCH: 27.7 pg (ref 26.6–33.0)
MCHC: 33.3 g/dL (ref 31.5–35.7)
MCV: 83 fL (ref 79–97)
Platelets: 280 10*3/uL (ref 150–450)
RBC: 5.19 x10E6/uL (ref 3.77–5.28)
RDW: 14.3 % (ref 11.7–15.4)
WBC: 5 10*3/uL (ref 3.4–10.8)

## 2023-03-26 LAB — LIPID PANEL
Chol/HDL Ratio: 3 ratio (ref 0.0–4.4)
Cholesterol, Total: 142 mg/dL (ref 100–199)
HDL: 48 mg/dL (ref >39)
LDL Chol Calc (NIH): 76 mg/dL (ref 0–99)
Triglycerides: 96 mg/dL (ref 0–149)
VLDL Cholesterol Cal: 18 mg/dL (ref 5–40)

## 2023-03-26 LAB — HEMOGLOBIN A1C
Est. average glucose Bld gHb Est-mCnc: 111 mg/dL
Hgb A1c MFr Bld: 5.5 % (ref 4.8–5.6)

## 2023-03-26 MED ORDER — OZEMPIC (1 MG/DOSE) 4 MG/3ML ~~LOC~~ SOPN: 1.0000 mg | PEN_INJECTOR | SUBCUTANEOUS | 0 refills | Status: DC

## 2023-03-26 NOTE — Assessment & Plan Note (Signed)
Her weight is stable.

## 2023-03-26 NOTE — Assessment & Plan Note (Signed)
Chronic, controlled.  She will continue with lisinopril 10mg  daily. She is encouraged to follow low sodium diet.

## 2023-03-26 NOTE — Assessment & Plan Note (Signed)
She has triehd Zyrtec in the past; however, states it makes her groggy the next day. Advised to try 1/2 tab nightly.

## 2023-03-26 NOTE — Assessment & Plan Note (Addendum)
Chronic, LDL goal is less than 70. She will continue with ezetimibe 10mg  daily. She is statin intolerant. She will continue with Xigduo ER 10/1000mg  and Ozempic 1mg  (54 clicks) weekly. She will f/u in 3 months for CPE.

## 2023-03-26 NOTE — Progress Notes (Signed)
I,[name], CMA,acting as a Neurosurgeon for [name], MD.,have documented all relevant documentation on the behalf of [name], MD,as directed by  [name], MD while in the presence of [name], MD.  Subjective:  Patient ID: Michele Pope , female    DOB: 17-May-1951 , 72 y.o.   MRN: [id number]  Chief Complaint  Patient presents with   Diabetes   Hypertension    HPI  Patient presents today for DM, BP & weight check. The dose of her Ozempic was decreased due to excessive weight loss due to decreased appetite. She is now on 0.75mg  Ozempic (54 clicks) weekly.  She does feel better with this regimen. She reports compliance with medications. Denies headache, chest pain, and SOB.   She would like to get flu shot today.     Diabetes She presents for her follow-up diabetic visit. She has type 2 diabetes mellitus. There are no hypoglycemic associated symptoms. Pertinent negatives for diabetes include no blurred vision, no chest pain and no weakness. There are no hypoglycemic complications. Risk factors for coronary artery disease include diabetes mellitus, dyslipidemia, hypertension and post-menopausal.  Hypertension This is a chronic problem. The current episode started more than 1 year ago. The problem has been gradually improving since onset. The problem is controlled. Pertinent negatives include no blurred vision, chest pain, palpitations or shortness of breath.     Past Medical History:  Diagnosis Date   Diabetes mellitus without complication (HCC)    High cholesterol    HTN (hypertension)      Family History  Problem Relation Age of Onset   Heart attack Mother    Blindness Father      Current Outpatient Medications:    Blood Glucose Monitoring Suppl (ONE TOUCH ULTRA 2) w/Device KIT, USE TO TEST BLOOD SUGAR ONCE DAILY., Disp: 1 kit, Rfl: 0   Cholecalciferol (VITAMIN D3) 125 MCG (5000 UT) CAPS, Take by mouth., Disp: , Rfl:    Cyanocobalamin ([facility]  VITAMIN B12 PO), Take 1 tablet by mouth daily at 2 am., Disp: , Rfl:    Dapagliflozin Pro-metFORMIN ER 04-999 MG TB24, TAKE 1 TABLET BY MOUTH EVERY DAY, Disp: 90 tablet, Rfl: 1   diclofenac sodium (VOLTAREN) 1 % GEL, Apply topically 4 (four) times daily., Disp: , Rfl:    ezetimibe (ZETIA) 10 MG tablet, TAKE 1 TABLET BY MOUTH EVERY DAY, Disp: 90 tablet, Rfl: 1   icosapent Ethyl (VASCEPA) 1 g capsule, TAKE 1 CAPSULE BY MOUTH EVERY DAY, Disp: 90 capsule, Rfl: 1   lisinopril (ZESTRIL) 10 MG tablet, TAKE 1 TABLET BY MOUTH EVERY DAY, Disp: 90 tablet, Rfl: 1   loratadine (CLARITIN) 10 MG tablet, Take 1 tablet (10 mg total) by mouth daily., Disp: 90 tablet, Rfl: 1   Misc Natural Products (NEURIVA PO), Take by mouth. 1 per day, Disp: , Rfl:    mometasone (NASONEX) 50 MCG/ACT nasal spray, SPRAY 2 SPRAYS INTO EACH NOSTRIL EVERY DAY, Disp: 51 each, Rfl: 1   omeprazole (PRILOSEC) 40 MG capsule, TAKE 1 CAPSULE (40 MG TOTAL) BY MOUTH DAILY., Disp: 90 capsule, Rfl: 0   ONETOUCH ULTRA test strip, USE TO TEST BLOOD SUGAR ONCE DAILY, Disp: 100 each, Rfl: 0   Semaglutide, 1 MG/DOSE, (OZEMPIC, 1 MG/DOSE,) 4 MG/3ML SOPN, Inject 1 mg into the skin once a week. INJECT 1MG  SUBCUTANEOUSLY ONCE WEEKLY, Disp: 9 mL, Rfl: 0   Allergies  Allergen Reactions   Meloxicam Diarrhea   Pitavastatin  Review of Systems  Constitutional: Negative.   HENT:  Positive for postnasal drip.   Eyes:  Negative for blurred vision.  Respiratory: Negative.  Negative for shortness of breath.   Cardiovascular: Negative.  Negative for chest pain and palpitations.  Gastrointestinal: Negative.   Neurological: Negative.  Negative for weakness.  Psychiatric/Behavioral: Negative.       Today's Vitals   03/26/23 0856  BP: 120/78  Pulse: 75  SpO2: 91%  Weight: 161 lb 6.4 oz (73.2 kg)  Height: 5\' 6"  (1.676 m)   Body mass index is 26.05 kg/m.  Wt Readings from Last 3 Encounters:  03/26/23 161 lb 6.4 oz (73.2 kg)  12/25/22 162 lb 3.2  oz (73.6 kg)  08/28/22 161 lb 6.4 oz (73.2 kg)     Objective:  Physical Exam Vitals and nursing note reviewed.  Constitutional:      Appearance: Normal appearance.  HENT:     Head: Normocephalic and atraumatic.  Cardiovascular:     Rate and Rhythm: Normal rate and regular rhythm.     Heart sounds: Normal heart sounds.  Pulmonary:     Effort: Pulmonary effort is normal.     Breath sounds: Normal breath sounds.  Skin:    General: Skin is warm.  Neurological:     General: No focal deficit present.     Mental Status: She is alert.  Psychiatric:        Mood and Affect: Mood normal.        Behavior: Behavior normal.         Assessment And Plan:  Dyslipidemia due to type 2 diabetes mellitus (HCC) Assessment & Plan: Chronic, LDL goal is less than 70. She will continue with ezetimibe 10mg  daily. She is statin intolerant. She will continue with Xigduo ER 10/1000mg  and Ozempic 1mg  (54 clicks) weekly. She will f/u in 3 months for CPE.   Orders: -     CBC -     CMP14+EGFR -     Lipid panel -     Hemoglobin A1c  Hypertension associated with diabetes (HCC) Assessment & Plan: Chronic, controlled.  She will continue with lisinopril 10mg  daily. She is encouraged to follow low sodium diet.   Orders: -     CMP14+EGFR  Postnasal drip Assessment & Plan: She has triehd Zyrtec in the past; however, states it makes her groggy the next day. Advised to try 1/2 tab nightly.    Body mass index (BMI) 26.0-26.9, adult Assessment & Plan: Her weight is stable.    Immunization due -     Flu Vaccine Trivalent High Dose (Fluad)  Other orders -     Ozempic (1 MG/DOSE); Inject 1 mg into the skin once a week. INJECT 1MG  SUBCUTANEOUSLY ONCE WEEKLY  Dispense: 9 mL; Refill: 0     Return if symptoms worsen or fail to improve.  Patient was given opportunity to ask questions. Patient verbalized understanding of the plan and was able to repeat key elements of the plan. All questions were  answered to their satisfaction.    I, [name], MD, have reviewed all documentation for this visit. The documentation on 03/26/23 for the exam, diagnosis, procedures, and orders are all accurate and complete.   IF YOU HAVE BEEN REFERRED TO A SPECIALIST, IT MAY TAKE 1-2 WEEKS TO SCHEDULE/PROCESS THE REFERRAL. IF YOU HAVE NOT HEARD FROM US/SPECIALIST IN TWO WEEKS, PLEASE GIVE [place] A CALL AT [phone].   THE PATIENT IS ENCOURAGED TO PRACTICE SOCIAL  DISTANCING DUE TO THE COVID-19 PANDEMIC.

## 2023-04-09 ENCOUNTER — Telehealth: Payer: Medicare Other | Admitting: Physician Assistant

## 2023-04-09 DIAGNOSIS — B3731 Acute candidiasis of vulva and vagina: Secondary | ICD-10-CM | POA: Diagnosis not present

## 2023-04-09 MED ORDER — FLUCONAZOLE 150 MG PO TABS
150.0000 mg | ORAL_TABLET | Freq: Every day | ORAL | 0 refills | Status: DC
Start: 2023-04-09 — End: 2023-09-24

## 2023-04-09 NOTE — Progress Notes (Signed)

## 2023-04-09 NOTE — Progress Notes (Signed)
I have spent 5 minutes in review of e-visit questionnaire, review and updating patient chart, medical decision making and response to patient.   Mia Milan Cody Jacklynn Dehaas, PA-C    

## 2023-04-23 ENCOUNTER — Other Ambulatory Visit: Payer: Self-pay | Admitting: Internal Medicine

## 2023-04-24 ENCOUNTER — Ambulatory Visit: Payer: Medicare Other

## 2023-04-24 DIAGNOSIS — Z0000 Encounter for general adult medical examination without abnormal findings: Secondary | ICD-10-CM | POA: Diagnosis not present

## 2023-04-24 NOTE — Progress Notes (Signed)
Subjective:   Michele Pope is a 73 y.o. female who presents for Medicare Annual (Subsequent) preventive examination.  Visit Complete: Virtual  I connected with  Mar Daring on 04/24/23 by a audio enabled telemedicine application and verified that I am speaking with the correct person using two identifiers.  Patient Location: Home  Provider Location: Office/Clinic  I discussed the limitations of evaluation and management by telemedicine. The patient expressed understanding and agreed to proceed.  Because this visit was a virtual/telehealth visit, some criteria may be missing or patient reported. Any vitals not documented were not able to be obtained and vitals that have been documented are patient reported.   Cardiac Risk Factors include: advanced age (>41men, >33 women);diabetes mellitus;dyslipidemia;hypertension     Objective:    Today's Vitals   There is no height or weight on file to calculate BMI.     04/24/2023   12:22 PM 04/21/2022    9:17 AM 04/13/2021   12:01 PM 02/24/2020   10:39 AM 02/17/2019    4:08 PM  Advanced Directives  Does Patient Have a Medical Advance Directive? No No No No No  Would patient like information on creating a medical advance directive?  Yes (ED - Information included in AVS)  Yes (MAU/Ambulatory/Procedural Areas - Information given) No - Patient declined    Current Medications (verified) Outpatient Encounter Medications as of 04/24/2023  Medication Sig   Blood Glucose Monitoring Suppl (ONE TOUCH ULTRA 2) w/Device KIT USE TO TEST BLOOD SUGAR ONCE DAILY.   Cholecalciferol (VITAMIN D3) 125 MCG (5000 UT) CAPS Take by mouth.   Cyanocobalamin ([facility] VITAMIN B12 PO) Take 1 tablet by mouth daily at 2 am.   Dapagliflozin Pro-metFORMIN ER 04-999 MG TB24 TAKE 1 TABLET BY MOUTH EVERY DAY   diclofenac sodium (VOLTAREN) 1 % GEL Apply topically 4 (four) times daily.   ezetimibe (ZETIA) 10 MG tablet TAKE 1 TABLET BY MOUTH EVERY DAY   icosapent Ethyl  (VASCEPA) 1 g capsule TAKE 1 CAPSULE BY MOUTH EVERY DAY   lisinopril (ZESTRIL) 10 MG tablet TAKE 1 TABLET BY MOUTH EVERY DAY   loratadine (CLARITIN) 10 MG tablet Take 1 tablet (10 mg total) by mouth daily.   Misc Natural Products (NEURIVA PO) Take by mouth. 1 per day   mometasone (NASONEX) 50 MCG/ACT nasal spray SPRAY 2 SPRAYS INTO EACH NOSTRIL EVERY DAY   omeprazole (PRILOSEC) 40 MG capsule TAKE 1 CAPSULE (40 MG TOTAL) BY MOUTH DAILY.   ONETOUCH ULTRA test strip USE TO TEST BLOOD SUGAR ONCE DAILY   Semaglutide, 1 MG/DOSE, (OZEMPIC, 1 MG/DOSE,) 4 MG/3ML SOPN Inject 1 mg into the skin once a week. INJECT 1MG  SUBCUTANEOUSLY ONCE WEEKLY   fluconazole (DIFLUCAN) 150 MG tablet Take 1 tablet (150 mg total) by mouth daily. (Patient not taking: Reported on 04/24/2023)   No facility-administered encounter medications on file as of 04/24/2023.    Allergies (verified) Meloxicam and Pitavastatin   History: Past Medical History:  Diagnosis Date   Diabetes mellitus without complication (HCC)    High cholesterol    HTN (hypertension)    Past Surgical History:  Procedure Laterality Date   BREAST EXCISIONAL BIOPSY Right 2000   CESAREAN SECTION     SHOULDER ARTHROSCOPY W/ ROTATOR CUFF REPAIR Right 08/22/2018   TOTAL ABDOMINAL HYSTERECTOMY  1995   Family History  Problem Relation Age of Onset   Heart attack Mother    Blindness Father    Social History   Socioeconomic History   Marital status:  Married    Spouse name: Not on file   Number of children: Not on file   Years of education: Not on file   Highest education level: Not on file  Occupational History   Not on file  Tobacco Use   Smoking status: Never   Smokeless tobacco: Never  Vaping Use   Vaping status: Never Used  Substance and Sexual Activity   Alcohol use: Never   Drug use: Never   Sexual activity: Not Currently    Birth control/protection: None  Other Topics Concern   Not on file  Social History Narrative   Not on file    Social Determinants of Health   Financial Resource Strain: Low Risk  (04/24/2023)   Overall Financial Resource Strain (CARDIA)    Difficulty of Paying Living Expenses: Not hard at all  Food Insecurity: No Food Insecurity (04/24/2023)   Hunger Vital Sign    Worried About Running Out of Food in the Last Year: Never true    Ran Out of Food in the Last Year: Never true  Transportation Needs: No Transportation Needs (04/24/2023)   PRAPARE - Administrator, Civil Service (Medical): No    Lack of Transportation (Non-Medical): No  Physical Activity: Sufficiently Active (04/24/2023)   Exercise Vital Sign    Days of Exercise per Week: 3 days    Minutes of Exercise per Session: 90 min  Stress: No Stress Concern Present (04/24/2023)   [employer] of Occupational Health - Occupational Stress Questionnaire    Feeling of Stress : Not at all  Social Connections: Socially Integrated (04/24/2023)   Social Connection and Isolation Panel [NHANES]    Frequency of Communication with Friends and Family: More than three times a week    Frequency of Social Gatherings with Friends and Family: More than three times a week    Attends Religious Services: More than 4 times per year    Active Member of [employer] or Organizations: Yes    Attends Engineer, structural: More than 4 times per year    Marital Status: Married    Tobacco Counseling Counseling given: Not Answered   Clinical Intake:  Pre-visit preparation completed: Yes  Pain : No/denies pain     Nutritional Risks: None Diabetes: Yes CBG done?: No Did pt. bring in CBG monitor from home?: No  How often do you need to have someone help you when you read instructions, pamphlets, or other written materials from your doctor or pharmacy?: 1 - Never  Interpreter Needed?: No  Information entered by :: NAllen LPN   Activities of Daily Living    04/24/2023   12:17 PM  In your present state of health, do you have any  difficulty performing the following activities:  Hearing? 0  Vision? 0  Difficulty concentrating or making decisions? 0  Walking or climbing stairs? 0  Dressing or bathing? 0  Doing errands, shopping? 0  Preparing Food and eating ? N  Using the Toilet? N  In the past six months, have you accidently leaked urine? N  Do you have problems with loss of bowel control? N  Managing your Medications? N  Managing your Finances? N  Housekeeping or managing your Housekeeping? N    Patient Care Team: Dorothyann Peng, MD as PCP - General (Internal Medicine) [facility]  Indicate any recent Medical Services you may have received from other than [facility] providers in the past year (date may be approximate).  Assessment:   This is a routine wellness examination for Ione.  Hearing/Vision screen Hearing Screening - Comments:: Denies hearing issues Vision Screening - Comments:: Regular eye exams, [facility]   Goals Addressed             This Visit's Progress    Patient Stated       04/24/2023, stay healthy       Depression Screen    04/24/2023   12:25 PM 03/26/2023    8:55 AM 08/28/2022   10:18 AM 05/28/2022    9:54 AM 04/21/2022    9:17 AM 04/21/2022    9:16 AM 03/12/2022    2:40 PM  PHQ 2/9 Scores  PHQ - 2 Score 0 0 0 0 0 0 0  PHQ- 9 Score 0 0         Fall Risk    04/24/2023   12:24 PM 03/26/2023    8:55 AM 08/28/2022   10:17 AM 05/28/2022    9:54 AM 04/21/2022    9:17 AM  Fall Risk   Falls in the past year? 0 0 0 0 0  Number falls in past yr: 0 0 0 0 0  Injury with Fall? 0 0 0 0 0  Risk for fall due to : Medication side effect No Fall Risks No Fall Risks No Fall Risks No Fall Risks  Follow up Falls prevention discussed;Falls evaluation completed Falls evaluation completed Falls evaluation completed Falls evaluation completed Falls evaluation completed    MEDICARE RISK AT HOME: Medicare Risk at Home Any stairs in or around the home?: Yes If so, are  there any without handrails?: No Home free of loose throw rugs in walkways, pet beds, electrical cords, etc?: Yes Adequate lighting in your home to reduce risk of falls?: Yes Life alert?: No Use of a cane, walker or w/c?: No Grab bars in the bathroom?: Yes Shower chair or bench in shower?: No Elevated toilet seat or a handicapped toilet?: Yes  TIMED UP AND GO:  Was the test performed?  No    Cognitive Function:        04/24/2023   12:25 PM 04/21/2022    9:18 AM 04/13/2021   12:03 PM 02/24/2020   10:42 AM 02/17/2019    3:37 PM  6CIT Screen  What Year? 0 points 0 points 0 points 0 points 0 points  What month? 0 points 0 points 0 points 0 points 0 points  What time? 0 points 0 points 0 points 0 points 0 points  Count back from 20 0 points 0 points 0 points 2 points 0 points  Months in reverse 0 points 0 points 0 points 0 points 0 points  Repeat phrase 0 points 0 points 2 points 4 points 0 points  Total Score 0 points 0 points 2 points 6 points 0 points    Immunizations Immunization History  Administered Date(s) Administered   Fluad Quad(high Dose 65+) 04/13/2021, 04/19/2022   Fluad Trivalent(High Dose 65+) 03/26/2023   Influenza, High Dose Seasonal PF 05/14/2018   Influenza,inj,Quad PF,6+ Mos 04/16/2019   Influenza,inj,quad, With Preservative 04/22/2018   Influenza-Unspecified 04/20/2019, 04/25/2020, 04/13/2021   Moderna Sars-Covid-2 Vaccination 08/24/2019, 09/18/2019, 05/25/2020, 09/21/2020   PNEUMOCOCCAL CONJUGATE-20 09/12/2021   Pneumococcal Polysaccharide-23 05/03/2009, 02/17/2019   Tdap 09/15/2018   Zoster Recombinant(Shingrix) 02/26/2020, 08/05/2020    TDAP status: Up to date  Flu Vaccine status: Up to date  Pneumococcal vaccine status: Up to date  Covid-19 vaccine status: Information provided on how to  obtain vaccines.   Qualifies for Shingles Vaccine? Yes   Zostavax completed Yes   Shingrix Completed?: Yes  Screening Tests Health Maintenance  Topic Date  Due   COVID-19 Vaccine (5 - 2023-24 season) 03/24/2023   Diabetic kidney evaluation - Urine ACR  05/29/2023   FOOT EXAM  05/29/2023   HEMOGLOBIN A1C  09/23/2023   MAMMOGRAM  11/22/2023   OPHTHALMOLOGY EXAM  03/03/2024   Diabetic kidney evaluation - eGFR measurement  03/25/2024   Medicare Annual Wellness (AWV)  04/23/2024   DTaP/Tdap/Td (2 - Td or Tdap) 09/15/2028   Colonoscopy  08/10/2032   Pneumonia Vaccine 35+ Years old  Completed   INFLUENZA VACCINE  Completed   DEXA SCAN  Completed   Hepatitis C Screening  Completed   Zoster Vaccines- Shingrix  Completed   HPV VACCINES  Aged Out    Health Maintenance  Health Maintenance Due  Topic Date Due   COVID-19 Vaccine (5 - 2023-24 season) 03/24/2023    Colorectal cancer screening: Type of screening: Colonoscopy. Completed 08/10/2022. Repeat every 5 years  Mammogram status: Completed 11/22/2022. Repeat every year  Bone Density status: Completed 11/05/2022.   Lung Cancer Screening: (Low Dose CT Chest recommended if Age 68-80 years, 20 pack-year currently smoking OR have quit w/in 15years.) does not qualify.   Lung Cancer Screening Referral: no  Additional Screening:  Hepatitis C Screening: does qualify; Completed 08/16/2012  Vision Screening: Recommended annual ophthalmology exams for early detection of glaucoma and other disorders of the eye. Is the patient up to date with their annual eye exam?  Yes  Who is the provider or what is the name of the office in which the patient attends annual eye exams? [facility] If pt is not established with a provider, would they like to be referred to a provider to establish care? No .   Dental Screening: Recommended annual dental exams for proper oral hygiene  Diabetic Foot Exam: Diabetic Foot Exam: Completed 05/28/2022  Community Resource Referral / Chronic Care Management: CRR required this visit?  No   CCM required this visit?  No     Plan:     I have personally reviewed  and noted the following in the patient's chart:   Medical and social history Use of alcohol, tobacco or illicit drugs  Current medications and supplements including opioid prescriptions. Patient is not currently taking opioid prescriptions. Functional ability and status Nutritional status Physical activity Advanced directives List of other physicians Hospitalizations, surgeries, and ER visits in previous 12 months Vitals Screenings to include cognitive, depression, and falls Referrals and appointments  In addition, I have reviewed and discussed with patient certain preventive protocols, quality metrics, and best practice recommendations. A written personalized care plan for preventive services as well as general preventive health recommendations were provided to patient.     Barb Merino, LPN   14/01/8294   After Visit Summary: (MyChart) Due to this being a telephonic visit, the after visit summary with patients personalized plan was offered to patient via MyChart   Nurse Notes: none

## 2023-04-24 NOTE — Patient Instructions (Signed)
[name] , Thank you for taking time to come for your Medicare Wellness Visit. I appreciate your ongoing commitment to your health goals. Please review the following plan we discussed and let me know if I can assist you in the future.   Referrals/Orders/Follow-Ups/Clinician Recommendations: none  This is a list of the screening recommended for you and due dates:  Health Maintenance  Topic Date Due   COVID-19 Vaccine (5 - 2023-24 season) 03/24/2023   Yearly kidney health urinalysis for diabetes  05/29/2023   Complete foot exam   05/29/2023   Hemoglobin A1C  09/23/2023   Mammogram  11/22/2023   Eye exam for diabetics  03/03/2024   Yearly kidney function blood test for diabetes  03/25/2024   Medicare Annual Wellness Visit  04/23/2024   DTaP/Tdap/Td vaccine (2 - Td or Tdap) 09/15/2028   Colon Cancer Screening  08/10/2032   Pneumonia Vaccine  Completed   Flu Shot  Completed   DEXA scan (bone density measurement)  Completed   Hepatitis C Screening  Completed   Zoster (Shingles) Vaccine  Completed   HPV Vaccine  Aged Out    Advanced directives: (ACP Link)Information on Advanced Care Planning can be found at [facility] [place] Advance Health Care Directives Advance Health Care Directives ([email or web address]   Next Medicare Annual Wellness Visit scheduled for next year: No, office will schedule appointment  Insert Preventive Care attachment Insert FALL PREVENTION attachment if needed

## 2023-05-03 ENCOUNTER — Ambulatory Visit: Payer: Medicare Other

## 2023-05-03 VITALS — BP 120/76 | HR 85 | Temp 98.8°F | Ht 66.0 in | Wt 161.0 lb

## 2023-05-03 DIAGNOSIS — Z23 Encounter for immunization: Secondary | ICD-10-CM | POA: Diagnosis not present

## 2023-05-03 NOTE — Progress Notes (Signed)
Patient presents today for covid shot.

## 2023-05-23 ENCOUNTER — Other Ambulatory Visit: Payer: Self-pay | Admitting: Internal Medicine

## 2023-05-23 DIAGNOSIS — E1169 Type 2 diabetes mellitus with other specified complication: Secondary | ICD-10-CM

## 2023-06-17 ENCOUNTER — Encounter: Payer: Medicare Other | Admitting: Internal Medicine

## 2023-06-25 ENCOUNTER — Encounter: Payer: Self-pay | Admitting: Internal Medicine

## 2023-06-25 ENCOUNTER — Ambulatory Visit (INDEPENDENT_AMBULATORY_CARE_PROVIDER_SITE_OTHER): Payer: Medicare Other | Admitting: Internal Medicine

## 2023-06-25 VITALS — BP 118/80 | HR 78 | Temp 97.8°F | Ht 66.0 in | Wt 158.6 lb

## 2023-06-25 DIAGNOSIS — F419 Anxiety disorder, unspecified: Secondary | ICD-10-CM | POA: Insufficient documentation

## 2023-06-25 DIAGNOSIS — I152 Hypertension secondary to endocrine disorders: Secondary | ICD-10-CM

## 2023-06-25 DIAGNOSIS — Z79899 Other long term (current) drug therapy: Secondary | ICD-10-CM

## 2023-06-25 DIAGNOSIS — E1169 Type 2 diabetes mellitus with other specified complication: Secondary | ICD-10-CM

## 2023-06-25 DIAGNOSIS — Z0000 Encounter for general adult medical examination without abnormal findings: Secondary | ICD-10-CM

## 2023-06-25 DIAGNOSIS — E1159 Type 2 diabetes mellitus with other circulatory complications: Secondary | ICD-10-CM | POA: Diagnosis not present

## 2023-06-25 DIAGNOSIS — E785 Hyperlipidemia, unspecified: Secondary | ICD-10-CM | POA: Diagnosis not present

## 2023-06-25 DIAGNOSIS — L853 Xerosis cutis: Secondary | ICD-10-CM

## 2023-06-25 HISTORY — DX: Anxiety disorder, unspecified: F41.9

## 2023-06-25 LAB — POCT URINALYSIS DIPSTICK
Bilirubin, UA: NEGATIVE
Glucose, UA: POSITIVE — AB
Ketones, UA: NEGATIVE
Leukocytes, UA: NEGATIVE
Nitrite, UA: NEGATIVE
Protein, UA: NEGATIVE
Spec Grav, UA: 1.015 (ref 1.010–1.025)
Urobilinogen, UA: 0.2 U/dL
pH, UA: 5.5 (ref 5.0–8.0)

## 2023-06-25 MED ORDER — DAPAGLIFLOZIN PRO-METFORMIN ER 10-1000 MG PO TB24: 1.0000 | ORAL_TABLET | Freq: Every day | ORAL | 1 refills | Status: DC

## 2023-06-25 MED ORDER — LISINOPRIL 10 MG PO TABS: 10.0000 mg | ORAL_TABLET | Freq: Every day | ORAL | 1 refills | Status: DC

## 2023-06-25 MED ORDER — OMEPRAZOLE 40 MG PO CPDR: 40.0000 mg | DELAYED_RELEASE_CAPSULE | Freq: Every day | ORAL | 1 refills | Status: DC

## 2023-06-25 MED ORDER — OZEMPIC (1 MG/DOSE) 4 MG/3ML ~~LOC~~ SOPN: 1.0000 mg | PEN_INJECTOR | SUBCUTANEOUS | 2 refills | Status: DC

## 2023-06-25 MED ORDER — EZETIMIBE 10 MG PO TABS: 10.0000 mg | ORAL_TABLET | Freq: Every day | ORAL | 1 refills | Status: DC

## 2023-06-25 NOTE — Progress Notes (Signed)
I,Michele Pope, CMA,acting as a Neurosurgeon for Michele Aliment, MD.,have documented all relevant documentation on the behalf of Michele Aliment, MD,as directed by  Michele Aliment, MD while in the presence of Michele Aliment, MD.  Subjective:    Patient ID: Michele Pope , female    DOB: Jun 14, 1951 , 72 y.o.   MRN: [id number]  Chief Complaint  Patient presents with   Annual Exam   Diabetes   Hypertension    HPI  Patient presents today for annual exam.  She is no longer followed by GYN for her pelvic exams.  She reports compliance with medications and has no other issues at this time. She denies headaches, chest pain and shortness of breath.       Diabetes She presents for her follow-up diabetic visit. She has type 2 diabetes mellitus. Hypoglycemia symptoms include nervousness/anxiousness. Pertinent negatives for diabetes include no blurred vision, no chest pain and no weakness. There are no hypoglycemic complications. Risk factors for coronary artery disease include diabetes mellitus, dyslipidemia, hypertension and post-menopausal. She is following a diabetic diet. She participates in exercise intermittently. There is no change in her home blood glucose trend. An ACE inhibitor/angiotensin II receptor blocker is being taken.  Hypertension This is a chronic problem. The current episode started more than 1 year ago. The problem has been gradually improving since onset. The problem is controlled. Pertinent negatives include no blurred vision, chest pain, palpitations or shortness of breath. The current treatment provides moderate improvement.     Past Medical History:  Diagnosis Date   Anxiety 06/25/2023   Diabetes mellitus without complication (HCC)    High cholesterol    HTN (hypertension)      Family History  Problem Relation Age of Onset   Heart attack Mother    Blindness Father      Current Outpatient Medications:    Blood Glucose Monitoring Suppl (ONE TOUCH ULTRA 2)  w/Device KIT, USE TO TEST BLOOD SUGAR ONCE DAILY., Disp: 1 kit, Rfl: 0   Cholecalciferol (VITAMIN D3) 125 MCG (5000 UT) CAPS, Take by mouth., Disp: , Rfl:    Cyanocobalamin ([employer] VITAMIN B12 PO), Take 1 tablet by mouth daily at 2 am., Disp: , Rfl:    diclofenac sodium (VOLTAREN) 1 % GEL, Apply topically 4 (four) times daily., Disp: , Rfl:    icosapent Ethyl (VASCEPA) 1 g capsule, TAKE 1 CAPSULE BY MOUTH EVERY DAY, Disp: 90 capsule, Rfl: 1   loratadine (CLARITIN) 10 MG tablet, Take 1 tablet (10 mg total) by mouth daily., Disp: 90 tablet, Rfl: 1   Misc Natural Products (NEURIVA PO), Take by mouth. 1 per day, Disp: , Rfl:    mometasone (NASONEX) 50 MCG/ACT nasal spray, SPRAY 2 SPRAYS INTO EACH NOSTRIL EVERY DAY, Disp: 51 each, Rfl: 1   ONETOUCH ULTRA test strip, USE TO TEST BLOOD SUGAR ONCE DAILY, Disp: 100 each, Rfl: 0   Dapagliflozin Pro-metFORMIN ER 04-999 MG TB24, Take 1 tablet by mouth daily., Disp: 90 tablet, Rfl: 1   ezetimibe (ZETIA) 10 MG tablet, Take 1 tablet (10 mg total) by mouth daily., Disp: 90 tablet, Rfl: 1   fluconazole (DIFLUCAN) 150 MG tablet, Take 1 tablet (150 mg total) by mouth daily. (Patient not taking: Reported on 04/24/2023), Disp: 1 tablet, Rfl: 0   lisinopril (ZESTRIL) 10 MG tablet, Take 1 tablet (10 mg total) by mouth daily., Disp: 90 tablet, Rfl: 1   omeprazole (PRILOSEC) 40 MG capsule, Take 1 capsule (40 mg total)  by mouth daily., Disp: 90 capsule, Rfl: 1   Semaglutide, 1 MG/DOSE, (OZEMPIC, 1 MG/DOSE,) 4 MG/3ML SOPN, Inject 1 mg into the skin once a week. INJECT 1MG  SUBCUTANEOUSLY ONCE WEEKLY, Disp: 9 mL, Rfl: 2   Allergies  Allergen Reactions   Meloxicam Diarrhea   Pitavastatin       The patient states she uses post menopausal status for birth control. No LMP recorded. Patient is postmenopausal.. Negative for Dysmenorrhea. Negative for: breast discharge, breast lump(s), breast pain and breast self exam. Associated symptoms include abnormal vaginal bleeding.  Pertinent negatives include abnormal bleeding (hematology), anxiety, decreased libido, depression, difficulty falling sleep, dyspareunia, history of infertility, nocturia, sexual dysfunction, sleep disturbances, urinary incontinence, urinary urgency, vaginal discharge and vaginal itching. Diet regular.The patient states her exercise level is  moderate, walking 3x/week.   . The patient's tobacco use is:  Social History   Tobacco Use  Smoking Status Never  Smokeless Tobacco Never  . She has been exposed to passive smoke. The patient's alcohol use is:  Social History   Substance and Sexual Activity  Alcohol Use Never    Review of Systems  Constitutional: Negative.   HENT: Negative.    Eyes: Negative.  Negative for blurred vision.  Respiratory: Negative.  Negative for shortness of breath.   Cardiovascular: Negative.  Negative for chest pain and palpitations.  Gastrointestinal: Negative.   Endocrine: Negative.   Genitourinary: Negative.   Musculoskeletal: Negative.   Skin: Negative.        She c/o dry skin. Not sure what is causing her sx.   Allergic/Immunologic: Negative.   Neurological: Negative.  Negative for weakness.  Hematological: Negative.   Psychiatric/Behavioral:  Negative for suicidal ideas. The patient is nervous/anxious.        She admits to previous episodes of anxiety--she was not sure what was triggering her symptoms. Initially, she thought her sx were related to the election. However, she eventually, cut back on caffeine intake and her sx improved.      Today's Vitals   06/25/23 0904  BP: 118/80  Pulse: 78  Temp: 97.8 F (36.6 C)  SpO2: 98%  Weight: 158 lb 9.6 oz (71.9 kg)  Height: 5\' 6"  (1.676 m)   Body mass index is 25.6 kg/m.  Wt Readings from Last 3 Encounters:  06/25/23 158 lb 9.6 oz (71.9 kg)  05/03/23 161 lb (73 kg)  03/26/23 161 lb 6.4 oz (73.2 kg)     Objective:  Physical Exam Vitals and nursing note reviewed.  Constitutional:       Appearance: Normal appearance.  HENT:     Head: Normocephalic and atraumatic.     Right Ear: Tympanic membrane, ear canal and external ear normal.     Left Ear: Tympanic membrane, ear canal and external ear normal.     Nose: Nose normal.     Mouth/Throat:     Mouth: Mucous membranes are moist.     Pharynx: Oropharynx is clear.  Eyes:     Extraocular Movements: Extraocular movements intact.     Conjunctiva/sclera: Conjunctivae normal.     Pupils: Pupils are equal, round, and reactive to light.  Cardiovascular:     Rate and Rhythm: Normal rate and regular rhythm.     Pulses: Normal pulses.          Dorsalis pedis pulses are 2+ on the right side and 2+ on the left side.     Heart sounds: Normal heart sounds.  Pulmonary:     Effort:  Pulmonary effort is normal.     Breath sounds: Normal breath sounds.  Abdominal:     General: Abdomen is flat. Bowel sounds are normal.     Palpations: Abdomen is soft.  Genitourinary:    Comments: deferred Musculoskeletal:        General: Normal range of motion.     Cervical back: Normal range of motion and neck supple.  Feet:     Right foot:     Protective Sensation: 5 sites tested.  5 sites sensed.     Skin integrity: Dry skin present.     Toenail Condition: Right toenails are normal.     Left foot:     Protective Sensation: 5 sites tested.  5 sites sensed.     Skin integrity: Dry skin present.     Toenail Condition: Left toenails are normal.  Skin:    General: Skin is warm and dry.  Neurological:     General: No focal deficit present.     Mental Status: She is alert and oriented to person, place, and time.  Psychiatric:        Mood and Affect: Mood normal.        Behavior: Behavior normal.         Assessment And Plan:     Encounter for general adult medical examination without abnormal findings Assessment & Plan: A full exam was performed.  Importance of monthly self breast exams was discussed with the patient.  She is advised to get  30-45 minutes of regular exercise, no less than four to five days per week. Both weight-bearing and aerobic exercises are recommended.  She is advised to follow a healthy diet with at least six fruits/veggies per day, decrease intake of red meat and other saturated fats and to increase fish intake to twice weekly.  Meats/fish should not be fried -- baked, boiled or broiled is preferable. It is also important to cut back on your sugar intake.  Be sure to read labels - try to avoid anything with added sugar, high fructose corn syrup or other sweeteners.  If you must use a sweetener, you can try stevia or monkfruit.  It is also important to avoid artificially sweetened foods/beverages and diet drinks. Lastly, wear SPF 50 sunscreen on exposed skin and when in direct sunlight for an extended period of time.  Be sure to avoid fast food restaurants and aim for at least 60 ounces of water daily.       Dyslipidemia due to type 2 diabetes mellitus (HCC) Assessment & Plan: Chronic, diabetic foot exam was peformed. LDL goal is less than 70. She will continue with ezetimibe 10mg  daily. She is statin intolerant. She will continue with Xigduo ER 10/1000mg  and Ozempic 1mg  (54 clicks) weekly. I DISCUSSED WITH THE PATIENT AT LENGTH REGARDING THE GOALS OF GLYCEMIC CONTROL AND POSSIBLE LONG-TERM COMPLICATIONS.  I  ALSO STRESSED THE IMPORTANCE OF COMPLIANCE WITH HOME GLUCOSE MONITORING, DIETARY RESTRICTIONS INCLUDING AVOIDANCE OF SUGARY DRINKS/PROCESSED FOODS,  ALONG WITH REGULAR EXERCISE.  I  ALSO STRESSED THE IMPORTANCE OF ANNUAL EYE EXAMS, SELF FOOT CARE AND COMPLIANCE WITH OFFICE VISITS.   Orders: -     Hemoglobin A1c -     BMP8+eGFR  Hypertension associated with diabetes (HCC) Assessment & Plan: Chronic, controlled.  She will continue with lisinopril 10mg  daily. EKG performed, NSR w/o acute changes. She is encouraged to follow low sodium diet. She will f/u in four months.   Orders: -     POCT  urinalysis  dipstick -     Microalbumin / creatinine urine ratio -     EKG 12-Lead  Dry skin Assessment & Plan: She is encouraged to increase her intake of healthy fats - olive oil, walnuts, almonds and avocados for example. She is also encouraged to stay well hydrated. She would likely benefit from a good moisturizer as we enter the frigid winter months. She may also get relief by limiting the length of time in a hot shower.    Anxiety Assessment & Plan: Thyroid function normal in June 2024. Sx have improved since she stopped caffeine. Encouraged to try herbal teas.    Drug therapy -     Vitamin B12  Other orders -     Dapagliflozin Pro-metFORMIN ER; Take 1 tablet by mouth daily.  Dispense: 90 tablet; Refill: 1 -     Ezetimibe; Take 1 tablet (10 mg total) by mouth daily.  Dispense: 90 tablet; Refill: 1 -     Lisinopril; Take 1 tablet (10 mg total) by mouth daily.  Dispense: 90 tablet; Refill: 1 -     Omeprazole; Take 1 capsule (40 mg total) by mouth daily.  Dispense: 90 capsule; Refill: 1 -     Ozempic (1 MG/DOSE); Inject 1 mg into the skin once a week. INJECT 1MG  SUBCUTANEOUSLY ONCE WEEKLY  Dispense: 9 mL; Refill: 2     Return for 1 YEAR HM, 4 MONTH DM . Patient was given opportunity to ask questions. Patient verbalized understanding of the plan and was able to repeat key elements of the plan. All questions were answered to their satisfaction.   I, Michele Aliment, MD, have reviewed all documentation for this visit. The documentation on 06/25/23 for the exam, diagnosis, procedures, and orders are all accurate and complete.

## 2023-06-25 NOTE — Assessment & Plan Note (Signed)
She is encouraged to increase her intake of healthy fats - olive oil, walnuts, almonds and avocados for example. She is also encouraged to stay well hydrated. She would likely benefit from a good moisturizer as we enter the frigid winter months. She may also get relief by limiting the length of time in a hot shower.

## 2023-06-25 NOTE — Assessment & Plan Note (Signed)

## 2023-06-25 NOTE — Patient Instructions (Signed)

## 2023-06-25 NOTE — Assessment & Plan Note (Addendum)
Chronic, controlled.  She will continue with lisinopril 10mg  daily. EKG performed, NSR w/o acute changes. She is encouraged to follow low sodium diet. She will f/u in four months.

## 2023-06-25 NOTE — Assessment & Plan Note (Signed)
Thyroid function normal in June 2024. Sx have improved since she stopped caffeine. Encouraged to try herbal teas.

## 2023-06-25 NOTE — Assessment & Plan Note (Signed)
Chronic, diabetic foot exam was peformed. LDL goal is less than 70. She will continue with ezetimibe 10mg  daily. She is statin intolerant. She will continue with Xigduo ER 10/1000mg  and Ozempic 1mg  (54 clicks) weekly. I DISCUSSED WITH THE PATIENT AT LENGTH REGARDING THE GOALS OF GLYCEMIC CONTROL AND POSSIBLE LONG-TERM COMPLICATIONS.  I  ALSO STRESSED THE IMPORTANCE OF COMPLIANCE WITH HOME GLUCOSE MONITORING, DIETARY RESTRICTIONS INCLUDING AVOIDANCE OF SUGARY DRINKS/PROCESSED FOODS,  ALONG WITH REGULAR EXERCISE.  I  ALSO STRESSED THE IMPORTANCE OF ANNUAL EYE EXAMS, SELF FOOT CARE AND COMPLIANCE WITH OFFICE VISITS.

## 2023-06-26 LAB — MICROALBUMIN / CREATININE URINE RATIO
Creatinine, Urine: 29.1 mg/dL
Microalb/Creat Ratio: <10 mg/g{creat} (ref 0–29)
Microalbumin, Urine: <3 ug/mL

## 2023-06-26 LAB — BMP8+EGFR
BUN/Creatinine Ratio: 11 — ABNORMAL LOW (ref 12–28)
BUN: 10 mg/dL (ref 8–27)
CO2: 25 mmol/L (ref 20–29)
Calcium: 9.5 mg/dL (ref 8.7–10.3)
Chloride: 106 mmol/L (ref 96–106)
Creatinine, Ser: 0.9 mg/dL (ref 0.57–1.00)
Glucose: 79 mg/dL (ref 70–99)
Potassium: 4.2 mmol/L (ref 3.5–5.2)
Sodium: 143 mmol/L (ref 134–144)
eGFR: 69 mL/min/{1.73_m2} (ref >59)

## 2023-06-26 LAB — VITAMIN B12: Vitamin B-12: 1086 pg/mL (ref 232–1245)

## 2023-06-26 LAB — HEMOGLOBIN A1C
Est. average glucose Bld gHb Est-mCnc: 108 mg/dL
Hgb A1c MFr Bld: 5.4 % (ref 4.8–5.6)

## 2023-06-27 ENCOUNTER — Ambulatory Visit: Payer: Medicare Other | Attending: Family Medicine

## 2023-06-27 ENCOUNTER — Ambulatory Visit (INDEPENDENT_AMBULATORY_CARE_PROVIDER_SITE_OTHER): Payer: Medicare Other | Admitting: Family Medicine

## 2023-06-27 ENCOUNTER — Encounter: Payer: Self-pay | Admitting: Family Medicine

## 2023-06-27 VITALS — BP 100/60 | HR 82 | Temp 98.1°F | Ht 66.0 in | Wt 158.0 lb

## 2023-06-27 DIAGNOSIS — F419 Anxiety disorder, unspecified: Secondary | ICD-10-CM

## 2023-06-27 DIAGNOSIS — R311 Benign essential microscopic hematuria: Secondary | ICD-10-CM | POA: Diagnosis not present

## 2023-06-27 DIAGNOSIS — R0602 Shortness of breath: Secondary | ICD-10-CM | POA: Diagnosis not present

## 2023-06-27 DIAGNOSIS — R002 Palpitations: Secondary | ICD-10-CM

## 2023-06-27 LAB — POCT URINALYSIS DIPSTICK
Bilirubin, UA: NEGATIVE
Blood, UA: NEGATIVE
Glucose, UA: POSITIVE — AB
Ketones, UA: NEGATIVE
Leukocytes, UA: NEGATIVE
Nitrite, UA: NEGATIVE
Protein, UA: NEGATIVE
Spec Grav, UA: 1.01 (ref 1.010–1.025)
Urobilinogen, UA: 0.2 U/dL
pH, UA: 5.5 (ref 5.0–8.0)

## 2023-06-27 MED ORDER — HYDROXYZINE HCL 25 MG PO TABS: 12.5000 mg | ORAL_TABLET | Freq: Two times a day (BID) | ORAL | 0 refills | Status: AC | PRN

## 2023-06-27 NOTE — Progress Notes (Signed)
I,Jameka J Llittleton, CMA,acting as a Neurosurgeon for [employer], NP.,have documented all relevant documentation on the behalf of Ellender Hose, NP,as directed by  Ellender Hose, NP while in the presence of Ellender Hose, NP.  Subjective:  Patient ID: Michele Pope , female    DOB: 21-May-1951 , 72 y.o.   MRN: [id number]  Chief Complaint  Patient presents with   Shortness of Breath    HPI  Patient is a 72 year female who presents today for  palpitations that has been ongoing  for about 2 months or more accompanied with shortness of breathe  She states she felt the palpitations 2 days when she was here during her visit with PCP but she reports she thought it was the caffeine she was drinking so she stopped drinking caffeine but the palpitations are still there and occasionally feels short of breath.  Patient states that she had a episode of nausea today also, so far she states that with her watch the highest she has noted her HR is 95 but mostly it is in the 70s. Patient denies any dizziness or syncope episodes.EKG done 2 days ago was NSR. Will order the outpatient heart monitor for patient. Discussed with patient and she voiced understanding.  Patient also states that she is a little anxious over hosting a big christmas party coming up but she is not sure if it is related to her symptoms. Will give antianxiety PRN and patient wants urine checked agin because there was blood in her last urine.     Past Medical History:  Diagnosis Date   Anxiety 06/25/2023   Diabetes mellitus without complication (HCC)    High cholesterol    HTN (hypertension)      Family History  Problem Relation Age of Onset   Heart attack Mother    Blindness Father      Current Outpatient Medications:    Blood Glucose Monitoring Suppl (ONE TOUCH ULTRA 2) w/Device KIT, USE TO TEST BLOOD SUGAR ONCE DAILY., Disp: 1 kit, Rfl: 0   Cholecalciferol (VITAMIN D3) 125 MCG (5000 UT) CAPS, Take by mouth., Disp: , Rfl:     Cyanocobalamin ([employer] VITAMIN B12 PO), Take 1 tablet by mouth daily at 2 am., Disp: , Rfl:    Dapagliflozin Pro-metFORMIN ER 04-999 MG TB24, Take 1 tablet by mouth daily., Disp: 90 tablet, Rfl: 1   diclofenac sodium (VOLTAREN) 1 % GEL, Apply topically 4 (four) times daily., Disp: , Rfl:    ezetimibe (ZETIA) 10 MG tablet, Take 1 tablet (10 mg total) by mouth daily., Disp: 90 tablet, Rfl: 1   hydrOXYzine (ATARAX) 25 MG tablet, Take 0.5 tablets (12.5 mg total) by mouth 2 (two) times daily as needed for anxiety., Disp: 30 tablet, Rfl: 0   icosapent Ethyl (VASCEPA) 1 g capsule, TAKE 1 CAPSULE BY MOUTH EVERY DAY, Disp: 90 capsule, Rfl: 1   lisinopril (ZESTRIL) 10 MG tablet, Take 1 tablet (10 mg total) by mouth daily., Disp: 90 tablet, Rfl: 1   loratadine (CLARITIN) 10 MG tablet, Take 1 tablet (10 mg total) by mouth daily., Disp: 90 tablet, Rfl: 1   Misc Natural Products (NEURIVA PO), Take by mouth. 1 per day, Disp: , Rfl:    mometasone (NASONEX) 50 MCG/ACT nasal spray, SPRAY 2 SPRAYS INTO EACH NOSTRIL EVERY DAY, Disp: 51 each, Rfl: 1   omeprazole (PRILOSEC) 40 MG capsule, Take 1 capsule (40 mg total) by mouth daily., Disp: 90 capsule, Rfl: 1   ONETOUCH ULTRA test strip, USE  TO TEST BLOOD SUGAR ONCE DAILY, Disp: 100 each, Rfl: 0   Semaglutide, 1 MG/DOSE, (OZEMPIC, 1 MG/DOSE,) 4 MG/3ML SOPN, Inject 1 mg into the skin once a week. INJECT 1MG  SUBCUTANEOUSLY ONCE WEEKLY, Disp: 9 mL, Rfl: 2   fluconazole (DIFLUCAN) 150 MG tablet, Take 1 tablet (150 mg total) by mouth daily. (Patient not taking: Reported on 06/27/2023), Disp: 1 tablet, Rfl: 0   Allergies  Allergen Reactions   Meloxicam Diarrhea   Pitavastatin      Review of Systems  Constitutional: Negative.   HENT: Negative.    Respiratory:  Positive for shortness of breath. Negative for apnea and chest tightness.   Cardiovascular:  Positive for palpitations.  Gastrointestinal:  Positive for nausea. Negative for diarrhea.  Musculoskeletal: Negative.    Skin: Negative.   Neurological:  Negative for dizziness and light-headedness.  Psychiatric/Behavioral: Negative.       Today's Vitals   06/27/23 1518  BP: 100/60  Pulse: 82  Temp: 98.1 F (36.7 C)  SpO2: 97%  Weight: 158 lb (71.7 kg)  Height: 5\' 6"  (1.676 m)  PainSc: 0-No pain   Body mass index is 25.5 kg/m.  Wt Readings from Last 3 Encounters:  06/27/23 158 lb (71.7 kg)  06/25/23 158 lb 9.6 oz (71.9 kg)  05/03/23 161 lb (73 kg)    The 10-year ASCVD risk score (Arnett DK, et al., 2019) is: 12.6%   Values used to calculate the score:     Age: 65 years     Sex: Female     Is Non-Hispanic African American: Yes     Diabetic: Yes     Tobacco smoker: No     Systolic Blood Pressure: 100 mmHg     Is BP treated: Yes     HDL Cholesterol: 48 mg/dL     Total Cholesterol: 142 mg/dL  Objective:  Physical Exam Cardiovascular:     Rate and Rhythm: Normal rate.  Pulmonary:     Effort: Pulmonary effort is normal.     Breath sounds: Normal breath sounds. No wheezing or rhonchi.  Skin:    General: Skin is warm and dry.  Neurological:     Mental Status: She is alert.  Psychiatric:        Mood and Affect: Mood is anxious.         Assessment And Plan:  Palpitation Assessment & Plan: Outpatient HR monitor ordered  Orders: -     LONG TERM MONITOR (3-14 DAYS); Future  SOB (shortness of breath) Assessment & Plan: States she felt better after breathing tx  Orders: -     Inhalation treatment for airway obstruction or sputum production  Benign essential microscopic hematuria -     POCT urinalysis dipstick  Anxiety Assessment & Plan: Use hydroxyzine 12.5 mg BID PRN .  Orders: -     hydrOXYzine HCl; Take 0.5 tablets (12.5 mg total) by mouth 2 (two) times daily as needed for anxiety.  Dispense: 30 tablet; Refill: 0    Return if symptoms worsen or fail to improve, for Keep next appt.  Patient was given opportunity to ask questions. Patient verbalized understanding of  the plan and was able to repeat key elements of the plan. All questions were answered to their satisfaction.   I, Ellender Hose, NP, have reviewed all documentation for this visit. The documentation on 07/03/2023 for the exam, diagnosis, procedures, and orders are all accurate and complete.     IF YOU HAVE BEEN REFERRED TO A SPECIALIST,  IT MAY TAKE 1-2 WEEKS TO SCHEDULE/PROCESS THE REFERRAL. IF YOU HAVE NOT HEARD FROM US/SPECIALIST IN TWO WEEKS, PLEASE GIVE [place] A CALL AT [phone].

## 2023-06-27 NOTE — Progress Notes (Unsigned)
EP to read

## 2023-07-03 DIAGNOSIS — R002 Palpitations: Secondary | ICD-10-CM

## 2023-07-03 DIAGNOSIS — R0602 Shortness of breath: Secondary | ICD-10-CM | POA: Insufficient documentation

## 2023-07-03 DIAGNOSIS — R311 Benign essential microscopic hematuria: Secondary | ICD-10-CM | POA: Insufficient documentation

## 2023-07-03 NOTE — Assessment & Plan Note (Signed)
Outpatient HR monitor ordered

## 2023-07-03 NOTE — Assessment & Plan Note (Signed)
States she felt better after breathing tx

## 2023-07-03 NOTE — Assessment & Plan Note (Signed)
Use hydroxyzine 12.5 mg BID PRN .

## 2023-07-31 ENCOUNTER — Other Ambulatory Visit: Payer: Self-pay | Admitting: Family Medicine

## 2023-07-31 ENCOUNTER — Encounter: Payer: Self-pay | Admitting: Internal Medicine

## 2023-07-31 DIAGNOSIS — I499 Cardiac arrhythmia, unspecified: Secondary | ICD-10-CM

## 2023-07-31 DIAGNOSIS — R009 Unspecified abnormalities of heart beat: Secondary | ICD-10-CM

## 2023-07-31 MED ORDER — MAGNESIUM 250 MG PO TABS: 1.0000 | ORAL_TABLET | Freq: Every day | ORAL | 3 refills | Status: AC

## 2023-08-01 ENCOUNTER — Encounter: Payer: Self-pay | Admitting: Internal Medicine

## 2023-08-01 ENCOUNTER — Other Ambulatory Visit: Payer: Self-pay

## 2023-08-01 DIAGNOSIS — H9313 Tinnitus, bilateral: Secondary | ICD-10-CM

## 2023-08-08 ENCOUNTER — Ambulatory Visit: Payer: Self-pay | Admitting: Family Medicine

## 2023-08-21 ENCOUNTER — Other Ambulatory Visit: Payer: Self-pay | Admitting: Family Medicine

## 2023-08-21 DIAGNOSIS — F419 Anxiety disorder, unspecified: Secondary | ICD-10-CM

## 2023-09-23 ENCOUNTER — Encounter (INDEPENDENT_AMBULATORY_CARE_PROVIDER_SITE_OTHER): Payer: Self-pay

## 2023-09-23 ENCOUNTER — Ambulatory Visit (INDEPENDENT_AMBULATORY_CARE_PROVIDER_SITE_OTHER): Payer: Medicare Other | Admitting: Otolaryngology

## 2023-09-23 ENCOUNTER — Ambulatory Visit (INDEPENDENT_AMBULATORY_CARE_PROVIDER_SITE_OTHER): Payer: Medicare Other | Admitting: Audiology

## 2023-09-23 VITALS — BP 128/77 | HR 96 | Ht 66.0 in | Wt 161.0 lb

## 2023-09-23 DIAGNOSIS — H903 Sensorineural hearing loss, bilateral: Secondary | ICD-10-CM

## 2023-09-23 DIAGNOSIS — H9312 Tinnitus, left ear: Secondary | ICD-10-CM | POA: Diagnosis not present

## 2023-09-23 DIAGNOSIS — H9042 Sensorineural hearing loss, unilateral, left ear, with unrestricted hearing on the contralateral side: Secondary | ICD-10-CM

## 2023-09-23 NOTE — Progress Notes (Signed)
  [address] [place], [place] [address] [phone]  Audiological Evaluation    Name: Michele Pope     DOB:   11-07-1950      MRN:   [id number]                                                                                     Service Date: 09/23/2023     Accompanied by: unaccompanied    Patient comes today after Dr. Suszanne Conners, ENT sent a referral for a hearing evaluation due to concerns with hearing loss.   Symptoms Yes Details  Hearing loss  []  Previous audiogram was completed at Dr. [facility] in 2020.  Tinnitus  [x]  Left ear with onset 2-3 years ago  Ear pain/ infections/pressure  []    Balance problems  []    Noise exposure history  [x]  Reports a long time ago went to a concert and afterwards her ears would hurt- possibly a temporary threshold shift in hearing too.  Previous ear surgeries  []    Family history of hearing loss  []    Amplification  []    Other  []      Otoscopy: Right ear: Clear external ear canals and notable landmarks visualized on the tympanic membrane. Left ear:  Clear external ear canals and notable landmarks visualized on the tympanic membrane.  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    Left ear-  Normal to borderline normal hearing from [phone] Hz, except for a mild  sensorineural hearing loss from 3000 Hz - 4000 Hz.  Speech Audiometry: Right ear- Speech Reception Threshold (SRT) was obtained at 20 dBHL. Left ear-Speech Reception Threshold (SRT) was obtained at 20 dBHL.   Word Recognition Score Tested using NU-6 (MLV) Right ear: 100% was obtained at a presentation level of 60  dBHL with contralateral masking which is deemed as  excellent. Left ear: 100% was obtained at a presentation level of 60 dBHL with contralateral masking which is deemed as  excellent.   The hearing test results were completed under headphones and re-checked with inserts and results are deemed to be of good reliability. Test technique:  conventional    The hearing asymmetry worse at 1000 Hz in the right ear and worse in the left ear at 3k-4kHz continues to be perceived.   Recommendations: Follow up with ENT as scheduled for today. Return for a hearing evaluation if concerns with hearing changes arise or per MD recommendation. Use hearing protection when exposed to loud/damaging sounds.    Babita Amaker MARIE LEROUX-MARTINEZ, AUD

## 2023-09-24 ENCOUNTER — Telehealth: Admitting: Physician Assistant

## 2023-09-24 DIAGNOSIS — B3731 Acute candidiasis of vulva and vagina: Secondary | ICD-10-CM

## 2023-09-24 MED ORDER — FLUCONAZOLE 150 MG PO TABS: ORAL_TABLET | ORAL | 0 refills | Status: DC

## 2023-09-24 NOTE — Progress Notes (Signed)
 I have spent 5 minutes in review of e-visit questionnaire, review and updating patient chart, medical decision making and response to patient.   Piedad Climes, PA-C

## 2023-09-24 NOTE — Progress Notes (Signed)

## 2023-09-25 ENCOUNTER — Other Ambulatory Visit: Payer: Self-pay

## 2023-09-25 DIAGNOSIS — H9312 Tinnitus, left ear: Secondary | ICD-10-CM | POA: Insufficient documentation

## 2023-09-25 DIAGNOSIS — H9042 Sensorineural hearing loss, unilateral, left ear, with unrestricted hearing on the contralateral side: Secondary | ICD-10-CM | POA: Insufficient documentation

## 2023-09-25 DIAGNOSIS — E785 Hyperlipidemia, unspecified: Secondary | ICD-10-CM

## 2023-09-25 MED ORDER — OZEMPIC (1 MG/DOSE) 4 MG/3ML ~~LOC~~ SOPN: 1.0000 mg | PEN_INJECTOR | SUBCUTANEOUS | 2 refills | Status: DC

## 2023-09-25 NOTE — Progress Notes (Signed)
 Patient ID: Michele Pope, female   DOB: [birth year], 73 y.o.   MRN: [id number]  CC: Left ear tinnitus  HPI:  Michele Pope is a 73 y.o. female who presents today complaining of left ear tinnitus for the past 3 years.  She describes the tinnitus as a constant high-pitched ringing noise.  She denies any otalgia, otorrhea, or vertigo.  The patient has a history of chronic rhinitis and eustachian tube dysfunction.  She was previously treated with Flonase nasal spray.  She reports improvement in her nasal congestion with the nasal spray.  She has no previous ENT surgery.  Past Medical History:  Diagnosis Date   Anxiety 06/25/2023   Diabetes mellitus without complication (HCC)    High cholesterol    HTN (hypertension)     Past Surgical History:  Procedure Laterality Date   BREAST EXCISIONAL BIOPSY Right 2000   CESAREAN SECTION     SHOULDER ARTHROSCOPY W/ ROTATOR CUFF REPAIR Right 08/22/2018   TOTAL ABDOMINAL HYSTERECTOMY  1995    Family History  Problem Relation Age of Onset   Heart attack Mother    Blindness Father     Social History:  reports that she has never smoked. She has never used smokeless tobacco. She reports that she does not drink alcohol and does not use drugs.  Allergies:  Allergies  Allergen Reactions   Meloxicam Diarrhea   Pitavastatin     Prior to Admission medications   Medication Sig Start Date End Date Taking? Authorizing Provider  Blood Glucose Monitoring Suppl (ONE TOUCH ULTRA 2) w/Device KIT USE TO TEST BLOOD SUGAR ONCE DAILY. 01/15/23  Yes Dorothyann Peng, MD  Cholecalciferol (VITAMIN D3) 125 MCG (5000 UT) CAPS Take by mouth.   Yes [provider]  Cyanocobalamin ([facility] VITAMIN B12 PO) Take 1 tablet by mouth daily at 2 am.   Yes [provider]  Dapagliflozin Pro-metFORMIN ER 04-999 MG TB24 Take 1 tablet by mouth daily. 06/25/23  Yes Dorothyann Peng, MD  diclofenac sodium (VOLTAREN) 1 % GEL Apply topically 4 (four) times daily.   Yes  [provider]  ezetimibe (ZETIA) 10 MG tablet Take 1 tablet (10 mg total) by mouth daily. 06/25/23  Yes Dorothyann Peng, MD  icosapent Ethyl (VASCEPA) 1 g capsule TAKE 1 CAPSULE BY MOUTH EVERY DAY 05/23/23  Yes Dorothyann Peng, MD  lisinopril (ZESTRIL) 10 MG tablet Take 1 tablet (10 mg total) by mouth daily. 06/25/23  Yes Dorothyann Peng, MD  loratadine (CLARITIN) 10 MG tablet Take 1 tablet (10 mg total) by mouth daily. 02/27/22  Yes Dorothyann Peng, MD  Magnesium 250 MG TABS Take 1 tablet (250 mg total) by mouth daily. 07/31/23  Yes Ellender Hose, NP  Misc Natural Products (NEURIVA PO) Take by mouth. 1 per day   Yes [provider]  mometasone (NASONEX) 50 MCG/ACT nasal spray SPRAY 2 SPRAYS INTO EACH NOSTRIL EVERY DAY 05/23/23  Yes Dorothyann Peng, MD  omeprazole (PRILOSEC) 40 MG capsule Take 1 capsule (40 mg total) by mouth daily. 06/25/23  Yes Dorothyann Peng, MD  [facility] ULTRA test strip USE TO TEST BLOOD SUGAR ONCE DAILY 03/18/23  Yes Dorothyann Peng, MD  Semaglutide, 1 MG/DOSE, (OZEMPIC, 1 MG/DOSE,) 4 MG/3ML SOPN Inject 1 mg into the skin once a week. INJECT 1MG  SUBCUTANEOUSLY ONCE WEEKLY 06/25/23  Yes Dorothyann Peng, MD  fluconazole (DIFLUCAN) 150 MG tablet Take 1 tablet PO once. 09/24/23   Waldon Merl, PA-C    Blood pressure 128/77, pulse 96, height 5\' 6"  (  1.676 m), weight 161 lb (73 kg), SpO2 96%. Exam: General: Communicates without difficulty, well nourished, no acute distress. Head: Normocephalic, no evidence injury, no tenderness, facial buttresses intact without stepoff. Face/sinus: No tenderness to palpation and percussion. Facial movement is normal and symmetric. Eyes: PERRL, EOMI. No scleral icterus, conjunctivae clear. Neuro: CN II exam reveals vision grossly intact.  No nystagmus at any point of gaze. Ears: Auricles well formed without lesions.  Ear canals are intact without mass or lesion.  No erythema or edema is appreciated.  The TMs are intact without fluid. Nose:  External evaluation reveals normal support and skin without lesions.  Dorsum is intact.  Anterior rhinoscopy reveals congested mucosa over anterior aspect of inferior turbinates and intact septum.  No purulence noted. Oral:  Oral cavity and oropharynx are intact, symmetric, without erythema or edema.  Mucosa is moist without lesions. Neck: Full range of motion without pain.  There is no significant lymphadenopathy.  No masses palpable.  Thyroid bed within normal limits to palpation.  Parotid glands and submandibular glands equal bilaterally without mass.  Trachea is midline. Neuro:  CN 2-12 grossly intact.   Her hearing test shows asymmetric mild left ear sensorineural hearing loss.  Assessment: 1.  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 2.  Her left ear tinnitus is likely a direct result of her hearing loss. 3.  Her ear canals, tympanic membranes, and middle ear spaces are normal.  Plan: 1.  The physical exam findings and the hearing test results are reviewed with the patient. 2.  The small possibility of a retrocochlear lesion causing the asymmetric hearing loss is discussed.  The options of conservative observation versus MRI scan are reviewed.  The patient would like to proceed with the MRI scan. 3.  The strategies to cope with tinnitus, including the use of masker, hearing aids, tinnitus retraining therapy, and avoidance of caffeine and alcohol are discussed. 4.  The patient will return for reevaluation in 6 months, sooner if abnormality is noted on her MRI scan.   Elly Haffey W Reginia Battie 09/25/2023, 9:32 AM

## 2023-09-27 ENCOUNTER — Encounter: Payer: Self-pay | Admitting: Internal Medicine

## 2023-09-30 ENCOUNTER — Other Ambulatory Visit: Payer: Self-pay

## 2023-09-30 MED ORDER — DAPAGLIFLOZIN PRO-METFORMIN ER 10-1000 MG PO TB24: 1.0000 | ORAL_TABLET | Freq: Every day | ORAL | 1 refills | Status: DC

## 2023-10-02 ENCOUNTER — Ambulatory Visit: Payer: Medicare Other | Attending: Cardiology | Admitting: Cardiology

## 2023-10-02 ENCOUNTER — Ambulatory Visit (HOSPITAL_COMMUNITY)
Admission: RE | Admit: 2023-10-02 | Discharge: 2023-10-02 | Disposition: A | Discharge status: Home / Self Care | Source: Ambulatory Visit | Attending: Otolaryngology | Admitting: Otolaryngology

## 2023-10-02 ENCOUNTER — Encounter: Payer: Self-pay | Admitting: Cardiology

## 2023-10-02 VITALS — BP 120/78 | HR 83 | Ht 66.0 in | Wt 165.6 lb

## 2023-10-02 DIAGNOSIS — H9042 Sensorineural hearing loss, unilateral, left ear, with unrestricted hearing on the contralateral side: Secondary | ICD-10-CM | POA: Insufficient documentation

## 2023-10-02 DIAGNOSIS — E785 Hyperlipidemia, unspecified: Secondary | ICD-10-CM

## 2023-10-02 DIAGNOSIS — I152 Hypertension secondary to endocrine disorders: Secondary | ICD-10-CM | POA: Diagnosis not present

## 2023-10-02 DIAGNOSIS — R002 Palpitations: Secondary | ICD-10-CM | POA: Diagnosis not present

## 2023-10-02 DIAGNOSIS — E1169 Type 2 diabetes mellitus with other specified complication: Secondary | ICD-10-CM | POA: Diagnosis not present

## 2023-10-02 DIAGNOSIS — E1159 Type 2 diabetes mellitus with other circulatory complications: Secondary | ICD-10-CM

## 2023-10-02 MED ORDER — GADOBUTROL 1 MMOL/ML IV SOLN
7.0000 mL | Freq: Once | INTRAVENOUS | Status: AC | PRN
  Administered 2023-10-02: 7 mL via INTRAVENOUS

## 2023-10-02 NOTE — Progress Notes (Signed)
 Cardiology Office Note:    Date:  10/02/2023  NAME:  Michele Pope    MRN: [id number] DOB:  Oct 07, 1950   PCP:  Dorothyann Peng, MD  Former Cardiology Providers: NA Primary Cardiologist:  Tessa Lerner, DO, [facility] (established care 10/02/2023) Electrophysiologist:  None   Referring MD: Ellender Hose, NP  Reason of Consult: Palpitations  Chief Complaint  Patient presents with  . Palpitations    History of Present Illness:    Michele Pope is a 73 y.o. African-American female whose past medical history and cardiovascular risk factors includes: Hypertension, hyperlipidemia, anxiety, type II diabetes. She is being seen today for the evaluation of palpitations at the request of Ellender Hose, NP.  Palpitations: Started back in November/December 2024. Precipitating factors were increased stress during the holiday season, increase consumption of coffee/chocolate. Patient started to monitor her symptoms and aggravating factors and after reducing stress and foods high in caffeinated content her symptoms are started to improve. When present they only last for few seconds. Now they occur monthly. Self-limited No near-syncope or syncopal events.  Overall functional capacity is excellent-walks at least [phone] steps per day.  Factors to consider: History of thyroid disease: None History of anemia: None Coffee consumption: None Caffeinated beverages/sodas: None Energy drinks: None Alcohol use: None Stimulants: none  Illicit drugs: Nond Weight loss supplements: On Ozempic  New over-the-counter medications: None Herbal supplements: none Any prior workup: None  Current Medications: Current Meds  Medication Sig  . Blood Glucose Monitoring Suppl (ONE TOUCH ULTRA 2) w/Device KIT USE TO TEST BLOOD SUGAR ONCE DAILY.  Marland Kitchen Cholecalciferol (VITAMIN D3) 125 MCG (5000 UT) CAPS Take by mouth.  . Cyanocobalamin ([facility] VITAMIN B12 PO) Take 1 tablet by mouth daily at 2 am.  . Dapagliflozin  Pro-metFORMIN ER 04-999 MG TB24 Take 1 tablet by mouth daily.  . diclofenac sodium (VOLTAREN) 1 % GEL Apply topically 4 (four) times daily.  Marland Kitchen ezetimibe (ZETIA) 10 MG tablet Take 1 tablet (10 mg total) by mouth daily.  . fluconazole (DIFLUCAN) 150 MG tablet Take 1 tablet PO once.  Marland Kitchen icosapent Ethyl (VASCEPA) 1 g capsule TAKE 1 CAPSULE BY MOUTH EVERY DAY  . lisinopril (ZESTRIL) 10 MG tablet Take 1 tablet (10 mg total) by mouth daily.  Marland Kitchen loratadine (CLARITIN) 10 MG tablet Take 1 tablet (10 mg total) by mouth daily.  . Magnesium 250 MG TABS Take 1 tablet (250 mg total) by mouth daily.  . Misc Natural Products (NEURIVA PO) Take by mouth. 1 per day  . mometasone (NASONEX) 50 MCG/ACT nasal spray SPRAY 2 SPRAYS INTO EACH NOSTRIL EVERY DAY  . omeprazole (PRILOSEC) 40 MG capsule Take 1 capsule (40 mg total) by mouth daily.  [name] ULTRA test strip USE TO TEST BLOOD SUGAR ONCE DAILY  . Semaglutide, 1 MG/DOSE, (OZEMPIC, 1 MG/DOSE,) 4 MG/3ML SOPN Inject 1 mg into the skin once a week. INJECT 1MG  SUBCUTANEOUSLY ONCE WEEKLY     Allergies:    Meloxicam and Pitavastatin   Past Medical History: Past Medical History:  Diagnosis Date  . Anxiety 06/25/2023  . Diabetes mellitus without complication (HCC)   . High cholesterol   . HTN (hypertension)     Past Surgical History: Past Surgical History:  Procedure Laterality Date  . BREAST EXCISIONAL BIOPSY Right 2000  . CESAREAN SECTION    . SHOULDER ARTHROSCOPY W/ ROTATOR CUFF REPAIR Right 08/22/2018  . TOTAL ABDOMINAL HYSTERECTOMY  1995    Social History: Social History   Tobacco Use  .  Smoking status: Never  . Smokeless tobacco: Never  Vaping Use  . Vaping status: Never Used  Substance Use Topics  . Alcohol use: Never  . Drug use: Never    Family History: Family History  Problem Relation Age of Onset  . Heart attack Mother   . Blindness Father     ROS:   Review of Systems  Cardiovascular:  Positive for palpitations (see HPI).  Negative for chest pain, claudication, irregular heartbeat, leg swelling, near-syncope, orthopnea, paroxysmal nocturnal dyspnea and syncope.  Respiratory:  Negative for shortness of breath.   Hematologic/Lymphatic: Negative for bleeding problem.    EKGs/Labs/Other Studies Reviewed:   EKG: EKG Interpretation Date/Time:  Wednesday October 02 2023 13:39:27 EDT Ventricular Rate:  83 PR Interval:  170 QRS Duration:  78 QT Interval:  374 QTC Calculation: 439 R Axis:   20  Text Interpretation: Normal sinus rhythm Normal ECG No previous ECGs available Confirmed by Tessa Lerner [id number]) on 10/02/2023 1:49:30 PM  Echocardiogram: NA  Stress Testing:  NA  Cardiac Monitor:  07/18/2023 NSR with sinus brady (57/min) and sinus tachycardia (143/min) with ave HR 88/min. 3 brief NS SVT up to 7 beats at 121/min. Rare PAC's and PVC's (<1%)  Labs:    Latest Ref Rng & Units 03/26/2023    9:31 AM 05/28/2022   10:54 AM 05/08/2021    4:08 PM  CBC  WBC 3.4 - 10.8 x10E3/uL 5.0  5.2  7.9   Hemoglobin 11.1 - 15.9 g/dL 13.2  44.0  10.2   Hematocrit 34.0 - 46.6 % 43.3  43.4  41.1   Platelets 150 - 450 x10E3/uL 280  293  329        Latest Ref Rng & Units 06/25/2023    9:53 AM 03/26/2023    9:31 AM 12/25/2022   12:24 PM  BMP  Glucose 70 - 99 mg/dL 79  80  65   BUN 8 - 27 mg/dL 10  12  11    Creatinine 0.57 - 1.00 mg/dL 7.25  3.66  4.40   BUN/Creat Ratio 12 - 28 11  13  12    Sodium 134 - 144 mmol/L 143  140  140   Potassium 3.5 - 5.2 mmol/L 4.2  4.4  4.4   Chloride 96 - 106 mmol/L 106  104  102   CO2 20 - 29 mmol/L 25  26  24    Calcium 8.7 - 10.3 mg/dL 9.5  9.7  9.8       Latest Ref Rng & Units 06/25/2023    9:53 AM 03/26/2023    9:31 AM 12/25/2022   12:24 PM  CMP  Glucose 70 - 99 mg/dL 79  80  65   BUN 8 - 27 mg/dL 10  12  11    Creatinine 0.57 - 1.00 mg/dL 3.47  4.25  9.56   Sodium 134 - 144 mmol/L 143  140  140   Potassium 3.5 - 5.2 mmol/L 4.2  4.4  4.4   Chloride 96 - 106 mmol/L 106  104  102    CO2 20 - 29 mmol/L 25  26  24    Calcium 8.7 - 10.3 mg/dL 9.5  9.7  9.8   Total Protein 6.0 - 8.5 g/dL  6.8    Total Bilirubin 0.0 - 1.2 mg/dL  0.6    Alkaline Phos 44 - 121 IU/L  98    AST 0 - 40 IU/L  27    ALT 0 - 32 IU/L  18      Lab Results  Component Value Date   CHOL 142 03/26/2023   HDL 48 03/26/2023   LDLCALC 76 03/26/2023   TRIG 96 03/26/2023   CHOLHDL 3.0 03/26/2023   No results for input(s): "LIPOA" in the last 8760 hours. No components found for: "NTPROBNP" No results for input(s): "PROBNP" in the last 8760 hours.  Recent Labs    12/25/22 1224  TSH 1.280    Physical Exam:    Today's Vitals   10/02/23 1335  BP: 120/78  Pulse: 83  SpO2: 98%  Weight: 165 lb 9.6 oz (75.1 kg)  Height: 5\' 6"  (1.676 m)   Body mass index is 26.73 kg/m. Wt Readings from Last 3 Encounters:  10/02/23 165 lb 9.6 oz (75.1 kg)  09/23/23 161 lb (73 kg)  06/27/23 158 lb (71.7 kg)    Physical Exam  Constitutional: No distress.  hemodynamically stable  Neck: No JVD present.  Cardiovascular: Normal rate, regular rhythm, S1 normal and S2 normal. Exam reveals no gallop, no S3 and no S4.  No murmur heard. Pulmonary/Chest: Effort normal and breath sounds normal. No stridor. She has no wheezes. She has no rales.  Musculoskeletal:        General: No edema.     Cervical back: Neck supple.  Skin: Skin is warm.   Impression & Recommendation(s):  Impression:   ICD-10-CM   1. Palpitation  R00.2 EKG 12-Lead    2. Hypertension associated with diabetes (HCC)  E11.59 ECHOCARDIOGRAM COMPLETE   I15.2     3. Dyslipidemia due to type 2 diabetes mellitus (HCC)  E11.69    E78.5        Recommendation(s):  Palpitation Initially started back in November/December 2024. Overall improving. Has learned that the triggers are increased stress, excessive consumption of caffeinated and chocolate rich foods/beverages Symptoms have improved as she has cut down on both caffeinated beverages and  chocolate. Her symptoms now occur on a monthly basis. Overall diagnostic yield for a Zio patch at this time is low Otherwise no identifiable reversible cause Shared decision was to hold off on additional workup at this time and to reconsider it if the symptoms were to get more frequent.  Patient and husband agreeable with the plan of care  Hypertension associated with diabetes (HCC) Office blood pressures are very well-controlled She has risk factors for CAD and therefore would like to proceed forward with at least an echocardiogram to obtain a baseline and to make sure there is no significant structural heart disease.  As long as the echocardiogram is favorable no additional testing is warranted and patient is advised to continue to improve her modifiable cardiovascular risk factors  Dyslipidemia due to type 2 diabetes mellitus (HCC) Currently on Zetia 10 mg p.o. daily as well as Vascepa 1 g every day. Most recent lipid profile from September 2024 illustrates LDL at 76 mg/dL Cardiology following peripherally, managed by primary care provider.   Orders Placed:  Orders Placed This Encounter  Procedures  . EKG 12-Lead  . ECHOCARDIOGRAM COMPLETE    Standing Status:   Future    Expected Date:   10/09/2023    Expiration Date:   10/01/2024    Where should this test be performed:   [facility])    Does the patient weigh less than or greater than 250 lbs?:   Patient weighs less than 250 lbs    Perflutren DEFINITY (image enhancing agent) should be administered unless hypersensitivity or  allergy exist:   Administer Perflutren    Reason for exam-Echo:   Other-Full Diagnosis List    Full ICD-10/Reason for Exam:   HTN (hypertension) [409811]   As part of today's office visit reviewed notes from referring physician dating back to 06/27/2023, EKG ordered and independently reviewed, independent review of labs from 03/26/2023, discussed management and workup of palpitations,  coordination of care, ordering additional testing and formulating a plan of care.  Final Medication List:   No orders of the defined types were placed in this encounter.   There are no discontinued medications.   Current Outpatient Medications:  .  Blood Glucose Monitoring Suppl (ONE TOUCH ULTRA 2) w/Device KIT, USE TO TEST BLOOD SUGAR ONCE DAILY., Disp: 1 kit, Rfl: 0 .  Cholecalciferol (VITAMIN D3) 125 MCG (5000 UT) CAPS, Take by mouth., Disp: , Rfl:  .  Cyanocobalamin ([facility] VITAMIN B12 PO), Take 1 tablet by mouth daily at 2 am., Disp: , Rfl:  .  Dapagliflozin Pro-metFORMIN ER 04-999 MG TB24, Take 1 tablet by mouth daily., Disp: 90 tablet, Rfl: 1 .  diclofenac sodium (VOLTAREN) 1 % GEL, Apply topically 4 (four) times daily., Disp: , Rfl:  .  ezetimibe (ZETIA) 10 MG tablet, Take 1 tablet (10 mg total) by mouth daily., Disp: 90 tablet, Rfl: 1 .  fluconazole (DIFLUCAN) 150 MG tablet, Take 1 tablet PO once., Disp: 1 tablet, Rfl: 0 .  icosapent Ethyl (VASCEPA) 1 g capsule, TAKE 1 CAPSULE BY MOUTH EVERY DAY, Disp: 90 capsule, Rfl: 1 .  lisinopril (ZESTRIL) 10 MG tablet, Take 1 tablet (10 mg total) by mouth daily., Disp: 90 tablet, Rfl: 1 .  loratadine (CLARITIN) 10 MG tablet, Take 1 tablet (10 mg total) by mouth daily., Disp: 90 tablet, Rfl: 1 .  Magnesium 250 MG TABS, Take 1 tablet (250 mg total) by mouth daily., Disp: 30 tablet, Rfl: 3 .  Misc Natural Products (NEURIVA PO), Take by mouth. 1 per day, Disp: , Rfl:  .  mometasone (NASONEX) 50 MCG/ACT nasal spray, SPRAY 2 SPRAYS INTO EACH NOSTRIL EVERY DAY, Disp: 51 each, Rfl: 1 .  omeprazole (PRILOSEC) 40 MG capsule, Take 1 capsule (40 mg total) by mouth daily., Disp: 90 capsule, Rfl: 1 .  ONETOUCH ULTRA test strip, USE TO TEST BLOOD SUGAR ONCE DAILY, Disp: 100 each, Rfl: 0 .  Semaglutide, 1 MG/DOSE, (OZEMPIC, 1 MG/DOSE,) 4 MG/3ML SOPN, Inject 1 mg into the skin once a week. INJECT 1MG  SUBCUTANEOUSLY ONCE WEEKLY, Disp: 9 mL, Rfl: 2  Consent:    NA  Disposition:   As needed  Her questions and concerns were addressed to her satisfaction. She voices understanding of the recommendations provided during this encounter.    Signed, Tessa Lerner, DO, [facility]  [facility]  [address] [place], [place] [address] 10/02/2023 7:45 PM

## 2023-10-02 NOTE — Patient Instructions (Signed)
 Medication Instructions:  Your physician recommends that you continue on your current medications as directed. Please refer to the Current Medication list given to you today.  *If you need a refill on your cardiac medications before your next appointment, please call your pharmacy*  Lab Work: None ordered today. If you have labs (blood work) drawn today and your tests are completely normal, you will receive your results only by: MyChart Message (if you have MyChart) OR A paper copy in the mail If you have any lab test that is abnormal or we need to change your treatment, we will call you to review the results.  Testing/Procedures: Your physician has requested that you have an echocardiogram. Echocardiography is a painless test that uses sound waves to create images of your heart. It provides your doctor with information about the size and shape of your heart and how well your heart's chambers and valves are working. This procedure takes approximately one hour. There are no restrictions for this procedure. Please do NOT wear cologne, perfume, aftershave, or lotions (deodorant is allowed). Please arrive 15 minutes prior to your appointment time.  Please note: We ask at that you not bring children with you during ultrasound (echo/ vascular) testing. Due to room size and safety concerns, children are not allowed in the ultrasound rooms during exams. Our front office staff cannot provide observation of children in our lobby area while testing is being conducted. An adult accompanying a patient to their appointment will only be allowed in the ultrasound room at the discretion of the ultrasound technician under special circumstances. We apologize for any inconvenience.   Follow-Up: At [facility], you and your health needs are our priority.  As part of our continuing mission to provide you with exceptional heart care, we have created designated Provider Care Teams.  These Care Teams include your  primary Cardiologist (physician) and Advanced Practice Providers (APPs -  Physician Assistants and Nurse Practitioners) who all work together to provide you with the care you need, when you need it.  We recommend signing up for the patient portal called "MyChart".  Sign up information is provided on this After Visit Summary.  MyChart is used to connect with patients for Virtual Visits (Telemedicine).  Patients are able to view lab/test results, encounter notes, upcoming appointments, etc.  Non-urgent messages can be sent to your provider as well.   To learn more about what you can do with MyChart, go to [email or web address].    Your next appointment:   As needed   The format for your next appointment:   In Person  Provider:   Tessa Lerner, DO {

## 2023-10-18 ENCOUNTER — Ambulatory Visit: Payer: Self-pay

## 2023-10-18 ENCOUNTER — Ambulatory Visit: Admitting: Family Medicine

## 2023-10-18 VITALS — BP 115/76 | HR 88 | Temp 98.2°F | Ht 66.0 in | Wt 164.6 lb

## 2023-10-18 DIAGNOSIS — R21 Rash and other nonspecific skin eruption: Secondary | ICD-10-CM

## 2023-10-18 MED ORDER — TRIAMCINOLONE ACETONIDE 0.5 % EX OINT: 1.0000 | TOPICAL_OINTMENT | Freq: Two times a day (BID) | CUTANEOUS | 0 refills | Status: AC

## 2023-10-18 NOTE — Progress Notes (Signed)
 Subjective:  Patient ID: Michele Pope, female    DOB: June 26, 1951  Age: 73 y.o. MRN: [id number]  CC:  Rash  HPI:  73 year old female presents with rash.  Rash Started Wednesday. Location: Neck. Raised. Mildly itching. No known inciting factor.  No pain.  No relieving factors.  No other complaints.  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 09/25/2023   Benign essential microscopic hematuria 07/03/2023   Palpitation 07/03/2023   SOB (shortness of breath) 07/03/2023   Dry skin 06/25/2023   Anxiety 06/25/2023   Body mass index (BMI) 26.0-26.9, adult 09/01/2022   Class 1 obesity due to excess calories with serious comorbidity and body mass index (BMI) of 31.0 to 31.9 in adult 09/12/2021   Snoring 10/29/2019   Dyslipidemia due to type 2 diabetes mellitus (HCC) 09/15/2018   Hypertension associated with diabetes (HCC) 05/14/2018   Anemia due to vitamin B12 deficiency 05/14/2018   Abnormal liver enzymes 05/14/2018   Encounter for general adult medical examination without abnormal findings 01/07/2018    Social Hx   Social History   Socioeconomic History   Marital status: Married    Spouse name: Not on file   Number of children: Not on file   Years of education: Not on file   Highest education level: Associate degree: occupational, Scientist, product/process development, or vocational program  Occupational History   Not on file  Tobacco Use   Smoking status: Never   Smokeless tobacco: Never  Vaping Use   Vaping status: Never Used  Substance and Sexual Activity   Alcohol use: Never   Drug use: Never   Sexual activity: Not Currently    Birth control/protection: None  Other Topics Concern   Not on file  Social History Narrative   Not on file   Social Drivers of Health   Financial Resource Strain: Low Risk  (10/18/2023)   Overall Financial Resource Strain  (CARDIA)    Difficulty of Paying Living Expenses: Not hard at all  Food Insecurity: No Food Insecurity (10/18/2023)   Hunger Vital Sign    Worried About Running Out of Food in the Last Year: Never true    Ran Out of Food in the Last Year: Never true  Transportation Needs: No Transportation Needs (10/18/2023)   PRAPARE - Administrator, Civil Service (Medical): No    Lack of Transportation (Non-Medical): No  Physical Activity: Sufficiently Active (10/18/2023)   Exercise Vital Sign    Days of Exercise per Week: 4 days    Minutes of Exercise per Session: 50 min  Stress: No Stress Concern Present (10/18/2023)   [employer] of Occupational Health - Occupational Stress Questionnaire    Feeling of Stress : Not at all  Social Connections: Socially Integrated (10/18/2023)   Social Connection and Isolation Panel [NHANES]    Frequency of Communication with Friends and Family: More than three times a week    Frequency of Social Gatherings with Friends and Family: Once a week    Attends Religious Services: More than 4 times per year    Active Member of [employer] or Organizations: Yes    Attends Banker Meetings: 1 to 4 times per year    Marital Status: Married    Review of Systems Per HPI  Objective:  BP 115/76   Pulse 88   Temp 98.2 F (36.8 C)  Ht 5\' 6"  (1.676 m)   Wt 164 lb 9.6 oz (74.7 kg)   SpO2 100%   BMI 26.57 kg/m      10/18/2023   11:14 AM 10/02/2023    1:35 PM 09/23/2023    2:52 PM  BP/Weight  Systolic BP 115 120 128  Diastolic BP 76 78 77  Wt. (Lbs) 164.6 165.6 161  BMI 26.57 kg/m2 26.73 kg/m2 25.99 kg/m2    Physical Exam Vitals and nursing note reviewed.  Constitutional:      General: She is not in acute distress.    Appearance: Normal appearance.  HENT:     Head: Normocephalic and atraumatic.  Eyes:     General:        Right eye: No discharge.        Left eye: No discharge.     Conjunctiva/sclera: Conjunctivae normal.  Neck:      Comments: Fine, papular rash noted to the right side of the neck. Neurological:     Mental Status: She is alert.  Psychiatric:        Mood and Affect: Mood normal.        Behavior: Behavior normal.     Lab Results  Component Value Date   WBC 5.0 03/26/2023   HGB 14.4 03/26/2023   HCT 43.3 03/26/2023   PLT 280 03/26/2023   GLUCOSE 79 06/25/2023   CHOL 142 03/26/2023   TRIG 96 03/26/2023   HDL 48 03/26/2023   LDLCALC 76 03/26/2023   ALT 18 03/26/2023   AST 27 03/26/2023   NA 143 06/25/2023   K 4.2 06/25/2023   CL 106 06/25/2023   CREATININE 0.90 06/25/2023   BUN 10 06/25/2023   CO2 25 06/25/2023   TSH 1.280 12/25/2022   HGBA1C 5.4 06/25/2023   MICROALBUR 10 05/08/2021     Assessment & Plan:  Rash Assessment & Plan: Likely contact/irritant/allergic in nature.  Topical triamcinolone.  Supportive care.   Other orders -     Triamcinolone Acetonide; Apply 1 Application topically 2 (two) times daily.  Dispense: 30 g; Refill: 0    Follow-up:  Return if symptoms worsen or fail to improve.  Everlene Other DO [facility]

## 2023-10-18 NOTE — Patient Instructions (Signed)
 Use the medication as directed.  Should resolve fairly quickly.  Take care  Dr. Adriana Simas

## 2023-10-18 NOTE — Assessment & Plan Note (Signed)
 Likely contact/irritant/allergic in nature.  Topical triamcinolone.  Supportive care.

## 2023-10-18 NOTE — Telephone Encounter (Signed)
  Chief Complaint: rash to neck Symptoms: spreading rash from back of hairline to sides of neck Frequency: about 2 days Pertinent Negatives: Patient denies fever, difficulty breathing, SOB, drainage from spots, pain Disposition: [] ED /[] Urgent Care (no appt availability in office) / [x] Appointment(In office/virtual)/ []  [facility]/ [] Home Care/ [] Refused Recommended Disposition /[] [facility]/ []  Follow-up with PCP Additional Notes: Michele Pope states that this started 2 days ago. Michele Pope states that it is spreading, severe itching, denies pain. Denies airway compromise. States worried it is measles. Michele Pope sched with another [facility] office today.   Reason for Disposition  [1] Localized purple or blood-colored spots or dots AND [2] not from injury or friction AND [3] no fever  Answer Assessment - Initial Assessment Questions 1. APPEARANCE of RASH: "Describe the rash."      Cluster like little bumps,  2. LOCATION: "Where is the rash located?"      Back of the head, hairline, down neck 3. NUMBER: "How many spots are there?"      alot 4. SIZE: "How big are the spots?" (Inches, centimeters or compare to size of a coin)      Small tiny bumps, size of end of pencil 5. ONSET: "When did the rash start?"      Started Wednesday,  6. ITCHING: "Does the rash itch?" If Yes, ask: "How bad is the itch?"  (Scale 0-10; or none, mild, moderate, severe)     8 7. PAIN: "Does the rash hurt?" If Yes, ask: "How bad is the pain?"  (Scale 0-10; or none, mild, moderate, severe)    - NONE (0): no pain    - MILD (1-3): doesn't interfere with normal activities     - MODERATE (4-7): interferes with normal activities or awakens from sleep     - SEVERE (8-10): excruciating pain, unable to do any normal activities     0 8. OTHER SYMPTOMS: "Do you have any other symptoms?" (e.g., fever)     Has not checked  Protocols used: Rash or Redness - Localized-A-AH

## 2023-10-22 ENCOUNTER — Ambulatory Visit: Admitting: Internal Medicine

## 2023-10-24 ENCOUNTER — Ambulatory Visit (HOSPITAL_COMMUNITY): Attending: Cardiology

## 2023-10-24 DIAGNOSIS — E1159 Type 2 diabetes mellitus with other circulatory complications: Secondary | ICD-10-CM | POA: Diagnosis not present

## 2023-10-24 DIAGNOSIS — I152 Hypertension secondary to endocrine disorders: Secondary | ICD-10-CM | POA: Diagnosis not present

## 2023-10-24 LAB — ECHOCARDIOGRAM COMPLETE
Area-P 1/2: 3.6 cm2
S' Lateral: 2.2 cm

## 2023-10-28 ENCOUNTER — Encounter: Payer: Self-pay | Admitting: Physician Assistant

## 2023-10-28 ENCOUNTER — Ambulatory Visit: Admitting: Physician Assistant

## 2023-10-28 VITALS — BP 101/68 | HR 91 | Temp 97.9°F | Ht 66.0 in | Wt 166.4 lb

## 2023-10-28 DIAGNOSIS — H6992 Unspecified Eustachian tube disorder, left ear: Secondary | ICD-10-CM | POA: Diagnosis not present

## 2023-10-28 DIAGNOSIS — L739 Follicular disorder, unspecified: Secondary | ICD-10-CM | POA: Insufficient documentation

## 2023-10-28 MED ORDER — FLUTICASONE PROPIONATE 50 MCG/ACT NA SUSP: 2.0000 | Freq: Every day | NASAL | 6 refills | Status: AC

## 2023-10-28 NOTE — Assessment & Plan Note (Signed)
 Patient presenting with complaints of ear fullness, pressure, and otalgia with associated seasonal allergy symptoms. She denies ear drainage or fevers. Otoscopic exam overall benign. TM mostly obscured by cerumen, no erythema or fluid noted. Symptoms most consistent with eustachian tube dysfunction secondary to seasonal allergies. I advised daily allergy medication such as Zyrtec or Claritin and nasal spray such as Flonase. Patient followed by ENT for sensorineural hearing loss. Recent workup benign. Patient scheduled to see PCP in 1 week. Warning signs reviewed. Patient to follow up for worsening pain, fever, drainage, or swelling.

## 2023-10-28 NOTE — Progress Notes (Signed)
 Acute Office Visit  Subjective:     Patient ID: Michele Pope, female    DOB: [birth year], 73 y.o.   MRN: [id number]   Patient presents today with complaints of left ear pressure and pain x3 days. She states symptoms began on Friday. She reports ear pressure with intermittent pain. She denies discharge or fevers. She reports increased allergy symptoms recently. She reports associated headache. She also noted on pustule on the back of her neck. She states recent neck rash treated with triamcinolone.      Review of Systems  Constitutional:  Negative for chills, fever and malaise/fatigue.  HENT:  Positive for ear pain. Negative for congestion and ear discharge.   Respiratory:  Negative for cough.   Cardiovascular:  Negative for chest pain.  Musculoskeletal:  Negative for myalgias.  Skin:  Positive for rash (neck).  Neurological:  Positive for headaches.        Objective:     BP 101/68   Pulse 91   Temp 97.9 F (36.6 C)   Ht 5\' 6"  (1.676 m)   Wt 166 lb 6.4 oz (75.5 kg)   SpO2 97%   BMI 26.86 kg/m   Physical Exam Vitals reviewed.  Constitutional:      General: She is not in acute distress.    Appearance: Normal appearance.  HENT:     Right Ear: Tympanic membrane and external ear normal.     Left Ear: Tympanic membrane and external ear normal. There is impacted cerumen.     Nose: Nose normal. No congestion.     Mouth/Throat:     Mouth: Mucous membranes are moist.     Pharynx: Oropharynx is clear.  Eyes:     Extraocular Movements: Extraocular movements intact.     Conjunctiva/sclera: Conjunctivae normal.  Cardiovascular:     Rate and Rhythm: Normal rate and regular rhythm.     Heart sounds: Normal heart sounds.  Pulmonary:     Effort: Pulmonary effort is normal.     Breath sounds: Normal breath sounds.  Musculoskeletal:        General: Normal range of motion.  Skin:    General: Skin is warm and dry.       Neurological:     General: No focal deficit  present.     Mental Status: She is alert and oriented to person, place, and time.  Psychiatric:        Mood and Affect: Mood normal.        Behavior: Behavior normal.     No results found for any visits on 10/28/23.      Assessment & Plan:  Dysfunction of left eustachian tube Assessment & Plan: Patient presenting with complaints of ear fullness, pressure, and otalgia with associated seasonal allergy symptoms. She denies ear drainage or fevers. Otoscopic exam overall benign. TM mostly obscured by cerumen, no erythema or fluid noted. Symptoms most consistent with eustachian tube dysfunction secondary to seasonal allergies. I advised daily allergy medication such as Zyrtec or Claritin and nasal spray such as Flonase. Patient followed by ENT for sensorineural hearing loss. Recent workup benign. Patient scheduled to see PCP in 1 week. Warning signs reviewed. Patient to follow up for worsening pain, fever, drainage, or swelling.   Orders: -     Fluticasone Propionate; Place 2 sprays into both nostrils daily.  Dispense: 16 g; Refill: 6  Folliculitis Assessment & Plan: 1 pustule like lesion on right posterior neck. No signs of acute infection. Mild erythema and  tenderness, no induration or fluctuance. No drainage. Advised warm compresses and continued use of previously prescribed triamcinolone cream. Patient to follow up with PCP.      Return if symptoms worsen or fail to improve.  Toni Amend Ivo Moga, PA-C

## 2023-10-28 NOTE — Assessment & Plan Note (Signed)
 1 pustule like lesion on right posterior neck. No signs of acute infection. Mild erythema and tenderness, no induration or fluctuance. No drainage. Advised warm compresses and continued use of previously prescribed triamcinolone cream. Patient to follow up with PCP.

## 2023-10-29 ENCOUNTER — Ambulatory Visit: Admitting: Family Medicine

## 2023-11-04 ENCOUNTER — Encounter: Payer: Self-pay | Admitting: Internal Medicine

## 2023-11-04 ENCOUNTER — Ambulatory Visit (INDEPENDENT_AMBULATORY_CARE_PROVIDER_SITE_OTHER): Payer: Medicare Other | Admitting: Internal Medicine

## 2023-11-04 VITALS — BP 118/78 | HR 79 | Temp 97.9°F | Ht 66.0 in | Wt 167.2 lb

## 2023-11-04 DIAGNOSIS — K219 Gastro-esophageal reflux disease without esophagitis: Secondary | ICD-10-CM

## 2023-11-04 DIAGNOSIS — I10 Essential (primary) hypertension: Secondary | ICD-10-CM | POA: Insufficient documentation

## 2023-11-04 DIAGNOSIS — E1159 Type 2 diabetes mellitus with other circulatory complications: Secondary | ICD-10-CM | POA: Diagnosis not present

## 2023-11-04 DIAGNOSIS — E1169 Type 2 diabetes mellitus with other specified complication: Secondary | ICD-10-CM

## 2023-11-04 DIAGNOSIS — R238 Other skin changes: Secondary | ICD-10-CM | POA: Insufficient documentation

## 2023-11-04 DIAGNOSIS — Z79899 Other long term (current) drug therapy: Secondary | ICD-10-CM

## 2023-11-04 DIAGNOSIS — E785 Hyperlipidemia, unspecified: Secondary | ICD-10-CM

## 2023-11-04 DIAGNOSIS — Z6826 Body mass index (BMI) 26.0-26.9, adult: Secondary | ICD-10-CM

## 2023-11-04 DIAGNOSIS — E663 Overweight: Secondary | ICD-10-CM

## 2023-11-04 HISTORY — DX: Gastro-esophageal reflux disease without esophagitis: K21.9

## 2023-11-04 MED ORDER — ICOSAPENT ETHYL 1 G PO CAPS: 1.0000 g | ORAL_CAPSULE | Freq: Every day | ORAL | 1 refills | Status: DC

## 2023-11-04 MED ORDER — LISINOPRIL 10 MG PO TABS: 10.0000 mg | ORAL_TABLET | Freq: Every day | ORAL | 1 refills | Status: DC

## 2023-11-04 MED ORDER — EZETIMIBE 10 MG PO TABS: 10.0000 mg | ORAL_TABLET | Freq: Every day | ORAL | 1 refills | Status: DC

## 2023-11-04 NOTE — Patient Instructions (Signed)

## 2023-11-04 NOTE — Progress Notes (Signed)
 I,[name], CMA,acting as a Neurosurgeon for [name], MD.,have documented all relevant documentation on the behalf of [name], MD,as directed by  [name], MD while in the presence of [name], MD.  Subjective:  Patient ID: Michele Pope , female    DOB: [birth year] , 73 y.o.   MRN: [id number]  Chief Complaint  Patient presents with   Diabetes    Patient presents today for dm & bpc. She reports compliance with medications. Denies headache, chest pain & sob.  She would like to discuss feeling she is gaining a pound every day. She admits craving lots of sugary foods & drinks.  While here today she would also like a bump on the back of her neck looked at. Denies pain / drainage.    Hypertension    HPI Discussed the use of AI scribe software for clinical note transcription with the patient, who gave verbal consent to proceed.  History of Present Illness Michele Pope is a 73 year old female with diabetes who presents for a diabetes check.  Her blood sugar levels are consistently in the eighties and nineties in the mornings. She is currently taking Xigduo 04/999 mg once daily and Ozempic at 54 clicks weekly, which is approximately 0.75 mg. She experiences cravings for sweets and has noticed a weight gain of about one pound every other week, increasing from 161-162 pounds to 167 pounds since her last visit. She aims to maintain her weight around 160 pounds. She tries to walk 8,000 to 9,000 steps a day but has not been able to consistently due to personal circumstances. She has not been engaging in strength training recently. She consumes regular sugar in her teas.  Regarding her cardiovascular health, she is on Zetia for cholesterol management due to intolerance to other cholesterol medications. She has no family history of heart disease. She is also taking Vascepa. Her cholesterol levels were last checked in September, and she is currently fasting for today's  tests.  She is taking lisinopril for blood pressure management. She no longer experiences nausea, which was previously associated with her weight loss.  She is also taking omeprazole for stomach issues, although she reports not having stomach problems anymore. She has not attempted to wean off omeprazole but is open to trying a reduced schedule.       Diabetes She presents for her follow-up diabetic visit. She has type 2 diabetes mellitus. There are no hypoglycemic associated symptoms. Pertinent negatives for diabetes include no blurred vision, no chest pain and no weakness. There are no hypoglycemic complications. Risk factors for coronary artery disease include diabetes mellitus, dyslipidemia, hypertension and post-menopausal.  Hypertension This is a chronic problem. The current episode started more than 1 year ago. The problem has been gradually improving since onset. The problem is controlled. Pertinent negatives include no blurred vision, chest pain, palpitations or shortness of breath.     Past Medical History:  Diagnosis Date   Anxiety 06/25/2023   Diabetes mellitus without complication (HCC)    Gastroesophageal reflux disease without esophagitis 11/04/2023   High cholesterol    HTN (hypertension)      Family History  Problem Relation Age of Onset   Heart attack Mother    Blindness Father      Current Outpatient Medications:    Blood Glucose Monitoring Suppl (ONE TOUCH ULTRA 2) w/Device KIT, USE TO TEST BLOOD SUGAR ONCE DAILY., Disp: 1 kit, Rfl: 0   Cholecalciferol (VITAMIN D3) 125  MCG (5000 UT) CAPS, Take by mouth., Disp: , Rfl:    Cyanocobalamin ([employer] VITAMIN B12 PO), Take 1 tablet by mouth daily at 2 am., Disp: , Rfl:    Dapagliflozin Pro-metFORMIN ER 04-999 MG TB24, Take 1 tablet by mouth daily., Disp: 90 tablet, Rfl: 1   diclofenac sodium (VOLTAREN) 1 % GEL, Apply topically 4 (four) times daily., Disp: , Rfl:    fluticasone (FLONASE) 50 MCG/ACT nasal spray, Place 2  sprays into both nostrils daily., Disp: 16 g, Rfl: 6   loratadine (CLARITIN) 10 MG tablet, Take 1 tablet (10 mg total) by mouth daily., Disp: 90 tablet, Rfl: 1   Magnesium 250 MG TABS, Take 1 tablet (250 mg total) by mouth daily., Disp: 30 tablet, Rfl: 3   Misc Natural Products (NEURIVA PO), Take by mouth. 1 per day, Disp: , Rfl:    mometasone (NASONEX) 50 MCG/ACT nasal spray, SPRAY 2 SPRAYS INTO EACH NOSTRIL EVERY DAY, Disp: 51 each, Rfl: 1   omeprazole (PRILOSEC) 40 MG capsule, Take 1 capsule (40 mg total) by mouth daily., Disp: 90 capsule, Rfl: 1   ONETOUCH ULTRA test strip, USE TO TEST BLOOD SUGAR ONCE DAILY, Disp: 100 each, Rfl: 0   Semaglutide, 1 MG/DOSE, (OZEMPIC, 1 MG/DOSE,) 4 MG/3ML SOPN, Inject 1 mg into the skin once a week. INJECT 1MG  SUBCUTANEOUSLY ONCE WEEKLY, Disp: 9 mL, Rfl: 2   triamcinolone ointment (KENALOG) 0.5 %, Apply 1 Application topically 2 (two) times daily., Disp: 30 g, Rfl: 0   ezetimibe (ZETIA) 10 MG tablet, Take 1 tablet (10 mg total) by mouth daily., Disp: 90 tablet, Rfl: 1   icosapent Ethyl (VASCEPA) 1 g capsule, Take 1 capsule (1 g total) by mouth daily., Disp: 90 capsule, Rfl: 1   lisinopril (ZESTRIL) 10 MG tablet, Take 1 tablet (10 mg total) by mouth daily., Disp: 90 tablet, Rfl: 1   Allergies  Allergen Reactions   Meloxicam Diarrhea   Pitavastatin      Review of Systems  Constitutional: Negative.   Eyes:  Negative for blurred vision.  Respiratory: Negative.  Negative for shortness of breath.   Cardiovascular: Negative.  Negative for chest pain and palpitations.  Gastrointestinal: Negative.   Neurological: Negative.  Negative for weakness.  Psychiatric/Behavioral: Negative.       Today's Vitals   11/04/23 0921  BP: 118/78  Pulse: 79  Temp: 97.9 F (36.6 C)  SpO2: 98%  Weight: 167 lb 3.2 oz (75.8 kg)  Height: 5\' 6"  (1.676 m)   Body mass index is 26.99 kg/m.  Wt Readings from Last 3 Encounters:  11/04/23 167 lb 3.2 oz (75.8 kg)  10/28/23 166  lb 6.4 oz (75.5 kg)  10/18/23 164 lb 9.6 oz (74.7 kg)     Objective:  Physical Exam Vitals and nursing note reviewed.  Constitutional:      Appearance: Normal appearance.  HENT:     Head: Normocephalic and atraumatic.  Eyes:     Extraocular Movements: Extraocular movements intact.  Cardiovascular:     Rate and Rhythm: Normal rate and regular rhythm.     Heart sounds: Normal heart sounds.  Pulmonary:     Effort: Pulmonary effort is normal.     Breath sounds: Normal breath sounds.  Musculoskeletal:     Cervical back: Normal range of motion.  Skin:    General: Skin is warm.     Comments: Flesh colored papule at base of neck  Neurological:     General: No focal deficit present.  Mental Status: She is alert.  Psychiatric:        Mood and Affect: Mood normal.        Behavior: Behavior normal.         Assessment And Plan:  Dyslipidemia due to type 2 diabetes mellitus (HCC) Assessment & Plan: Chronic,  LDL goal is less than 70. She will continue with ezetimibe 10mg  daily. She is statin intolerant. She will continue with Xigduo ER 10/1000mg  and increase Ozempic to a full 1mg  weekly.   On Zetia due to intolerance to other medications. Discussed need for aggressive treatment if LP(a) or calcium score indicates risk. Explained LP(a) and cardiac calcium score implications. - Order fasting lipid panel, liver, kidney function tests, and A1c. - Order LP(a) test. - Discuss cardiac calcium score and provide information for decision-making.  Orders: -     CMP14+EGFR -     Lipid panel -     Hemoglobin A1c -     Lipoprotein A (LPA) -     Icosapent Ethyl; Take 1 capsule (1 g total) by mouth daily.  Dispense: 90 capsule; Refill: 1  Essential hypertension, benign Assessment & Plan: Chronic, well controlled. She will continue with lisinopril 10mg  daily. Encouraged to follow low sodium diet.   Orders: -     CMP14+EGFR -     Lipid panel  Gastroesophageal reflux disease without  esophagitis Assessment & Plan: Controlled on omeprazole. Discussed reducing medication use. - Take omeprazole Monday through Friday and skip on weekends. - After one month, consider reducing to Monday, Wednesday, Friday if symptoms remain controlled.   Papule Assessment & Plan: There is no overlying erythema. Possibly due to ingrown hair. She will also discuss further with Derm.    Overweight with body mass index (BMI) of 26 to 26.9 in adult Assessment & Plan: She is concerned about weight gain. She was 158lbs in December, at that time concerned about weight loss. She is encouraged to incorporate more strength training into her workout routine. BMI 30 or less is acceptable for her demographic.   Drug therapy  Other orders -     Ezetimibe; Take 1 tablet (10 mg total) by mouth daily.  Dispense: 90 tablet; Refill: 1 -     Lisinopril; Take 1 tablet (10 mg total) by mouth daily.  Dispense: 90 tablet; Refill: 1   Return for 4 MONTH DM.  Patient was given opportunity to ask questions. Patient verbalized understanding of the plan and was able to repeat key elements of the plan. All questions were answered to their satisfaction.   I, Smiley Dung, MD, have reviewed all documentation for this visit. The documentation on 11/04/23 for the exam, diagnosis, procedures, and orders are all accurate and complete.   IF YOU HAVE BEEN REFERRED TO A SPECIALIST, IT MAY TAKE 1-2 WEEKS TO SCHEDULE/PROCESS THE REFERRAL. IF YOU HAVE NOT HEARD FROM US /SPECIALIST IN TWO WEEKS, PLEASE GIVE US  A CALL AT [phone].   THE PATIENT IS ENCOURAGED TO PRACTICE SOCIAL DISTANCING DUE TO THE COVID-19 PANDEMIC.

## 2023-11-04 NOTE — Assessment & Plan Note (Addendum)
 Chronic,  LDL goal is less than 70. She will continue with ezetimibe 10mg  daily. She is statin intolerant. She will continue with Xigduo ER 10/1000mg  and increase Ozempic to a full 1mg  weekly.   On Zetia due to intolerance to other medications. Discussed need for aggressive treatment if LP(a) or calcium score indicates risk. Explained LP(a) and cardiac calcium score implications. - Order fasting lipid panel, liver, kidney function tests, and A1c. - Order LP(a) test. - Discuss cardiac calcium score and provide information for decision-making.

## 2023-11-04 NOTE — Assessment & Plan Note (Signed)
 She is concerned about weight gain. She was 158lbs in December, at that time concerned about weight loss. She is encouraged to incorporate more strength training into her workout routine. BMI 30 or less is acceptable for her demographic.

## 2023-11-04 NOTE — Assessment & Plan Note (Signed)
 Controlled on omeprazole. Discussed reducing medication use. - Take omeprazole Monday through Friday and skip on weekends. - After one month, consider reducing to Monday, Wednesday, Friday if symptoms remain controlled.

## 2023-11-04 NOTE — Assessment & Plan Note (Signed)
 There is no overlying erythema. Possibly due to ingrown hair. She will also discuss further with Derm.

## 2023-11-04 NOTE — Assessment & Plan Note (Signed)
Chronic, well controlled. She will continue with lisinopril 10mg  daily. Encouraged to follow low sodium diet.

## 2023-11-05 LAB — CMP14+EGFR
ALT: 14 IU/L (ref 0–32)
AST: 28 IU/L (ref 0–40)
Albumin: 4.1 g/dL (ref 3.9–4.9)
Alkaline Phosphatase: 112 IU/L (ref 44–121)
BUN/Creatinine Ratio: 13 (ref 12–28)
BUN: 12 mg/dL (ref 8–27)
Bilirubin Total: 0.7 mg/dL (ref 0.0–1.2)
CO2: 24 mmol/L (ref 20–29)
Calcium: 9.7 mg/dL (ref 8.7–10.3)
Chloride: 103 mmol/L (ref 96–106)
Creatinine, Ser: 0.96 mg/dL (ref 0.57–1.00)
Globulin, Total: 2.7 g/dL (ref 1.5–4.5)
Glucose: 77 mg/dL (ref 70–99)
Potassium: 4.3 mmol/L (ref 3.5–5.2)
Sodium: 141 mmol/L (ref 134–144)
Total Protein: 6.8 g/dL (ref 6.0–8.5)
eGFR: 64 mL/min/{1.73_m2} (ref >59)

## 2023-11-05 LAB — HEMOGLOBIN A1C
Est. average glucose Bld gHb Est-mCnc: 105 mg/dL
Hgb A1c MFr Bld: 5.3 % (ref 4.8–5.6)

## 2023-11-05 LAB — LIPID PANEL
Chol/HDL Ratio: 2.9 ratio (ref 0.0–4.4)
Cholesterol, Total: 144 mg/dL (ref 100–199)
HDL: 50 mg/dL (ref >39)
LDL Chol Calc (NIH): 77 mg/dL (ref 0–99)
Triglycerides: 93 mg/dL (ref 0–149)
VLDL Cholesterol Cal: 17 mg/dL (ref 5–40)

## 2023-11-05 LAB — LIPOPROTEIN A (LPA): Lipoprotein (a): <8.4 nmol/L (ref <75.0)

## 2023-11-12 ENCOUNTER — Telehealth: Admitting: Physician Assistant

## 2023-11-12 DIAGNOSIS — B3731 Acute candidiasis of vulva and vagina: Secondary | ICD-10-CM | POA: Diagnosis not present

## 2023-11-12 MED ORDER — FLUCONAZOLE 150 MG PO TABS: 150.0000 mg | ORAL_TABLET | Freq: Once | ORAL | 0 refills | Status: AC

## 2023-11-12 NOTE — Progress Notes (Signed)
 I have spent 5 minutes in review of e-visit questionnaire, review and updating patient chart, medical decision making and response to patient.   Piedad Climes, PA-C

## 2023-11-12 NOTE — Progress Notes (Signed)

## 2023-11-13 ENCOUNTER — Encounter: Payer: Self-pay | Admitting: Internal Medicine

## 2023-11-20 ENCOUNTER — Other Ambulatory Visit: Payer: Self-pay | Admitting: Internal Medicine

## 2023-11-25 LAB — HM MAMMOGRAPHY

## 2024-01-01 ENCOUNTER — Encounter: Payer: Self-pay | Admitting: Internal Medicine

## 2024-02-23 ENCOUNTER — Telehealth: Admitting: Nurse Practitioner

## 2024-02-23 DIAGNOSIS — B3731 Acute candidiasis of vulva and vagina: Secondary | ICD-10-CM

## 2024-02-23 MED ORDER — FLUCONAZOLE 150 MG PO TABS
150.0000 mg | ORAL_TABLET | Freq: Once | ORAL | 0 refills | Status: AC
Start: 2024-02-23 — End: 2024-02-23

## 2024-02-23 NOTE — Progress Notes (Signed)
 zE-Visit for Vaginal Symptoms  We are sorry that you are not feeling well. Here is how we plan to help! Based on what you shared with me it looks like you: May have a yeast vaginosis  Vaginosis is an inflammation of the vagina that can result in discharge, itching and pain. The cause is usually a change in the normal balance of vaginal bacteria or an infection. Vaginosis can also result from reduced estrogen levels after menopause.  The most common causes of vaginosis are:   Bacterial vaginosis which results from an overgrowth of one on several organisms that are normally present in your vagina.   Yeast infections which are caused by a naturally occurring fungus called candida.   Vaginal atrophy (atrophic vaginosis) which results from the thinning of the vagina from reduced estrogen levels after menopause.   Trichomoniasis which is caused by a parasite and is commonly transmitted by sexual intercourse.  Factors that increase your risk of developing vaginosis include: Medications, such as antibiotics and steroids Uncontrolled diabetes Use of hygiene products such as bubble bath, vaginal spray or vaginal deodorant Douching Wearing damp or tight-fitting clothing Using an intrauterine device (IUD) for birth control Hormonal changes, such as those associated with pregnancy, birth control pills or menopause Sexual activity Having a sexually transmitted infection  Your treatment plan is A single Diflucan  (fluconazole ) 150mg  tablet once.  I have electronically sent this prescription into the pharmacy that you have chosen.  Be sure to take all of the medication as directed. Stop taking any medication if you develop a rash, tongue swelling or shortness of breath. Mothers who are breast feeding should consider pumping and discarding their breast milk while on these antibiotics. However, there is no consensus that infant exposure at these doses would be harmful.  Remember that medication creams  can weaken latex condoms. [name]   HOME CARE:  Good hygiene may prevent some types of vaginosis from recurring and may relieve some symptoms:  Avoid baths, hot tubs and whirlpool spas. Rinse soap from your outer genital area after a shower, and dry the area well to prevent irritation. Don't use scented or harsh soaps, such as those with deodorant or antibacterial action. Avoid irritants. These include scented tampons and pads. Wipe from front to back after using the toilet. Doing so avoids spreading fecal bacteria to your vagina.  Other things that may help prevent vaginosis include:  Don't douche. Your vagina doesn't require cleansing other than normal bathing. Repetitive douching disrupts the normal organisms that reside in the vagina and can actually increase your risk of vaginal infection. Douching won't clear up a vaginal infection. Use a latex condom. Both female and female latex condoms may help you avoid infections spread by sexual contact. Wear cotton underwear. Also wear pantyhose with a cotton crotch. If you feel comfortable without it, skip wearing underwear to bed. Yeast thrives in [name] Your symptoms should improve in the next day or two.  GET HELP RIGHT AWAY IF:  You have pain in your lower abdomen ( pelvic area or over your ovaries) You develop nausea or vomiting You develop a fever Your discharge changes or worsens You have persistent pain with intercourse You develop shortness of breath, a rapid pulse, or you faint.  These symptoms could be signs of problems or infections that need to be evaluated by a medical provider now.  MAKE SURE YOU   Understand these instructions. Will watch your condition. Will get help right away if you are not  doing well or get worse.  Thank you for choosing an e-visit.  Your e-visit answers were reviewed by a board certified advanced clinical practitioner to complete your personal care plan. Depending upon the condition, your  plan could have included both over the counter or prescription medications.  Please review your pharmacy choice. Make sure the pharmacy is open so you can pick up prescription now. If there is a problem, you may contact your provider through [employer] and have the prescription routed to another pharmacy.  Your safety is important to us . If you have drug allergies check your prescription carefully.   For the next 24 hours you can use MyChart to ask questions about today's visit, request a non-urgent call back, or ask for a work or school excuse. You will get an email in the next two days asking about your experience. I hope that your e-visit has been valuable and will speed your recovery.

## 2024-02-23 NOTE — Progress Notes (Signed)
 I have spent 5 minutes in review of e-visit questionnaire, review and updating patient chart, medical decision making and response to patient.   Claiborne Rigg, NP

## 2024-03-10 ENCOUNTER — Ambulatory Visit (INDEPENDENT_AMBULATORY_CARE_PROVIDER_SITE_OTHER): Admitting: Internal Medicine

## 2024-03-10 ENCOUNTER — Encounter: Payer: Self-pay | Admitting: Internal Medicine

## 2024-03-10 VITALS — BP 110/70 | HR 77 | Temp 98.4°F | Ht 66.0 in | Wt 166.4 lb

## 2024-03-10 DIAGNOSIS — E663 Overweight: Secondary | ICD-10-CM

## 2024-03-10 DIAGNOSIS — Z23 Encounter for immunization: Secondary | ICD-10-CM | POA: Diagnosis not present

## 2024-03-10 DIAGNOSIS — E785 Hyperlipidemia, unspecified: Secondary | ICD-10-CM

## 2024-03-10 DIAGNOSIS — K219 Gastro-esophageal reflux disease without esophagitis: Secondary | ICD-10-CM | POA: Diagnosis not present

## 2024-03-10 DIAGNOSIS — Z6826 Body mass index (BMI) 26.0-26.9, adult: Secondary | ICD-10-CM

## 2024-03-10 DIAGNOSIS — I10 Essential (primary) hypertension: Secondary | ICD-10-CM | POA: Diagnosis not present

## 2024-03-10 DIAGNOSIS — E1169 Type 2 diabetes mellitus with other specified complication: Secondary | ICD-10-CM

## 2024-03-10 MED ORDER — OZEMPIC (1 MG/DOSE) 4 MG/3ML ~~LOC~~ SOPN: 1.0000 mg | PEN_INJECTOR | SUBCUTANEOUS | 2 refills | Status: AC

## 2024-03-10 NOTE — Progress Notes (Signed)
 I,Michele Pope, CMA,acting as a neurosurgeon for Michele LOISE Slocumb, MD.,have documented all relevant documentation on the behalf of Michele LOISE Slocumb, MD,as directed by  Michele LOISE Slocumb, MD while in the presence of Michele LOISE Slocumb, MD.  Subjective:  Patient ID: Michele Pope , female    DOB: 1950-12-24 , 73 y.o.   MRN: [id number]  Chief Complaint  Patient presents with   Hypertension    Patient presents today for bp & dm follow up. She reports compliance with medications. Denies headache, chest pain & sob.    Diabetes    HPI Discussed the use of AI scribe software for clinical note transcription with the patient, who gave verbal consent to proceed.  History of Present Illness Michele Pope is a 73 year old female with diabetes and hypertension who presents for a routine follow-up visit.  She manages her diabetes with Synjardy and Ozempic , though she has been out of Ozempic  for about two weeks due to a pharmacy stock issue, which has now been resolved. Her blood sugar levels remain stable, with a recent reading of 96 mg/dL. She experiences cravings for sweets and acknowledges not consuming the recommended servings of fruits and vegetables, indicating a potential lack of fiber in her diet.  She takes lisinopril  10 mg for hypertension, and her most recent blood pressure reading was 110/70 mmHg. She takes omeprazole  as needed for gastroesophageal reflux disease (GERD) and does not use it daily.  She exercises regularly and is mindful of maintaining muscle mass, consuming chicken legs, fish, and eggs.   Diabetes She presents for her follow-up diabetic visit. She has type 2 diabetes mellitus. There are no hypoglycemic associated symptoms. Pertinent negatives for diabetes include no blurred vision, no chest pain and no weakness. There are no hypoglycemic complications. Risk factors for coronary artery disease include diabetes mellitus, dyslipidemia, hypertension and post-menopausal.   Hypertension This is a chronic problem. The current episode started more than 1 year ago. The problem has been gradually improving since onset. The problem is controlled. Pertinent negatives include no blurred vision, chest pain, palpitations or shortness of breath.     Past Medical History:  Diagnosis Date   Anxiety 06/25/2023   Diabetes mellitus without complication (HCC)    Gastroesophageal reflux disease without esophagitis 11/04/2023   High cholesterol    HTN (hypertension)      Family History  Problem Relation Age of Onset   Heart attack Mother    Blindness Father      Current Outpatient Medications:    Blood Glucose Monitoring Suppl (ONE TOUCH ULTRA 2) w/Device KIT, USE TO TEST BLOOD SUGAR ONCE DAILY., Disp: 1 kit, Rfl: 0   Cholecalciferol (VITAMIN D3) 125 MCG (5000 UT) CAPS, Take by mouth., Disp: , Rfl:    Cyanocobalamin  ([employer] VITAMIN B12 PO), Take 1 tablet by mouth daily at 2 am., Disp: , Rfl:    Dapagliflozin  Pro-metFORMIN  ER 04-999 MG TB24, Take 1 tablet by mouth daily., Disp: 90 tablet, Rfl: 1   diclofenac sodium (VOLTAREN) 1 % GEL, Apply topically 4 (four) times daily., Disp: , Rfl:    ezetimibe  (ZETIA ) 10 MG tablet, Take 1 tablet (10 mg total) by mouth daily., Disp: 90 tablet, Rfl: 1   fluticasone  (FLONASE ) 50 MCG/ACT nasal spray, Place 2 sprays into both nostrils daily., Disp: 16 g, Rfl: 6   icosapent  Ethyl (VASCEPA ) 1 g capsule, Take 1 capsule (1 g total) by mouth daily., Disp: 90 capsule, Rfl: 1   lisinopril  (ZESTRIL ) 10 MG  tablet, Take 1 tablet (10 mg total) by mouth daily., Disp: 90 tablet, Rfl: 1   loratadine  (CLARITIN ) 10 MG tablet, Take 1 tablet (10 mg total) by mouth daily., Disp: 90 tablet, Rfl: 1   Magnesium  250 MG TABS, Take 1 tablet (250 mg total) by mouth daily., Disp: 30 tablet, Rfl: 3   Misc Natural Products (NEURIVA PO), Take by mouth. 1 per day, Disp: , Rfl:    mometasone  (NASONEX ) 50 MCG/ACT nasal spray, SPRAY 2 SPRAYS INTO EACH NOSTRIL EVERY DAY,  Disp: 51 each, Rfl: 1   omeprazole  (PRILOSEC) 40 MG capsule, TAKE 1 CAPSULE (40 MG TOTAL) BY MOUTH DAILY., Disp: 90 capsule, Rfl: 1   ONETOUCH ULTRA test strip, USE TO TEST BLOOD SUGAR ONCE DAILY, Disp: 100 each, Rfl: 0   triamcinolone  ointment (KENALOG ) 0.5 %, Apply 1 Application topically 2 (two) times daily., Disp: 30 g, Rfl: 0   Semaglutide , 1 MG/DOSE, (OZEMPIC , 1 MG/DOSE,) 4 MG/3ML SOPN, Inject 1 mg into the skin once a week. INJECT 1MG  SUBCUTANEOUSLY ONCE WEEKLY, Disp: 9 mL, Rfl: 2   Allergies  Allergen Reactions   Meloxicam Diarrhea   Pitavastatin      Review of Systems  Constitutional: Negative.   Eyes:  Negative for blurred vision.  Respiratory: Negative.  Negative for shortness of breath.   Cardiovascular: Negative.  Negative for chest pain and palpitations.  Gastrointestinal: Negative.   Neurological: Negative.  Negative for weakness.  Psychiatric/Behavioral: Negative.       Today's Vitals   03/10/24 1016  BP: 110/70  Pulse: 77  Temp: 98.4 F (36.9 C)  SpO2: 98%  Weight: 166 lb 6.4 oz (75.5 kg)  Height: 5' 6 (1.676 m)   Body mass index is 26.86 kg/m.  Wt Readings from Last 3 Encounters:  03/10/24 166 lb 6.4 oz (75.5 kg)  11/04/23 167 lb 3.2 oz (75.8 kg)  10/28/23 166 lb 6.4 oz (75.5 kg)     Objective:  Physical Exam Vitals and nursing note reviewed.  Constitutional:      Appearance: Normal appearance.  HENT:     Head: Normocephalic and atraumatic.  Eyes:     Extraocular Movements: Extraocular movements intact.  Cardiovascular:     Rate and Rhythm: Normal rate and regular rhythm.     Heart sounds: Normal heart sounds.  Pulmonary:     Effort: Pulmonary effort is normal.     Breath sounds: Normal breath sounds.  Musculoskeletal:     Cervical back: Normal range of motion.  Skin:    General: Skin is warm.  Neurological:     General: No focal deficit present.     Mental Status: She is alert.  Psychiatric:        Mood and Affect: Mood normal.         Behavior: Behavior normal.         Assessment And Plan:  Essential hypertension, benign Assessment & Plan: Chronic, well controlled. She will continue with lisinopril  10mg  daily. Encouraged to follow low sodium diet.   Orders: -     CMP14+EGFR  Dyslipidemia due to type 2 diabetes mellitus (HCC) Assessment & Plan: Type 2 diabetes mellitus with well-controlled blood glucose levels. Last recorded blood glucose was 96 mg/dL. She has been off Ozempic  for two weeks due to pharmacy stock issues but has refills available. Craving sweets, possibly due to insufficient fiber intake. - Resume Ozempic  1 mg weekly as it is back in stock. - Recommend berberine or Gymnema supplements to help with  sweet cravings. - Increase dietary fiber intake to 5-9 servings of fruits and vegetables per day or consider fiber supplements like Benefiber or Metamucil. - Ensure adequate protein intake, aiming for at least 90 grams per day. - Download My Fitness app to track macronutrient intake. - Order blood count, liver function tests, kidney function tests, and A1c.  Orders: -     CBC -     CMP14+EGFR -     Hemoglobin A1c -     Ozempic  (1 MG/DOSE); Inject 1 mg into the skin once a week. INJECT 1MG  SUBCUTANEOUSLY ONCE WEEKLY  Dispense: 9 mL; Refill: 2 -     Ambulatory referral to Ophthalmology  Gastroesophageal reflux disease without esophagitis Assessment & Plan: Controlled on omeprazole  with prn use. Stop eating 3 hrs prior to lying down.     Overweight with body mass index (BMI) of 26 to 26.9 in adult Assessment & Plan: She c/o ongoing sweet cravings, potentially linked to insufficient fiber and protein intake. - Increase dietary fiber and protein intake as discussed under Type 2 diabetes mellitus. - Consider using My Fitness app to monitor dietary intake. - May benefit from supplementation with gymnema which can help with sugar cravings, but first she needs to focus on her fiber intake   Immunization  due -     Pfizer Comirnaty Covid-19 Vaccine 62yrs & older   Return if symptoms worsen or fail to improve.  Patient was given opportunity to ask questions. Patient verbalized understanding of the plan and was able to repeat key elements of the plan. All questions were answered to their satisfaction.   I, Michele LOISE Slocumb, MD, have reviewed all documentation for this visit. The documentation on 03/10/24 for the exam, diagnosis, procedures, and orders are all accurate and complete.   IF YOU HAVE BEEN REFERRED TO A SPECIALIST, IT MAY TAKE 1-2 WEEKS TO SCHEDULE/PROCESS THE REFERRAL. IF YOU HAVE NOT HEARD FROM US /SPECIALIST IN TWO WEEKS, PLEASE GIVE US  A CALL AT [phone].   THE PATIENT IS ENCOURAGED TO PRACTICE SOCIAL DISTANCING DUE TO THE COVID-19 PANDEMIC.

## 2024-03-10 NOTE — Patient Instructions (Addendum)
 Berberine or gymnema to help with sweet cravings  Hypertension, Adult Hypertension is another name for high blood pressure. High blood pressure forces your heart to work harder to pump blood. This can cause problems over time. There are two numbers in a blood pressure reading. There is a top number (systolic) over a bottom number (diastolic). It is best to have a blood pressure that is below 120/80. What are the causes? The cause of this condition is not known. Some other conditions can lead to high blood pressure. What increases the risk? Some lifestyle factors can make you more likely to develop high blood pressure: Smoking. Not getting enough exercise or physical activity. Being overweight. Having too much fat, sugar, calories, or salt (sodium) in your diet. Drinking too much alcohol. Other risk factors include: Having any of these conditions: Heart disease. Diabetes. High cholesterol. Kidney disease. Obstructive sleep apnea. Having a family history of high blood pressure and high cholesterol. Age. The risk increases with age. Stress. What are the signs or symptoms? High blood pressure may not cause symptoms. Very high blood pressure (hypertensive crisis) may cause: Headache. Fast or uneven heartbeats (palpitations). Shortness of breath. Nosebleed. Vomiting or feeling like you may vomit (nauseous). Changes in how you see. Very bad chest pain. Feeling dizzy. Seizures. How is this treated? This condition is treated by making healthy lifestyle changes, such as: Eating healthy foods. Exercising more. Drinking less alcohol. Your doctor may prescribe medicine if lifestyle changes do not help enough and if: Your top number is above 130. Your bottom number is above 80. Your personal target blood pressure may vary. Follow these instructions at home: Eating and drinking  If told, follow the DASH eating plan. To follow this plan: Fill one half of your plate at each meal with  fruits and vegetables. Fill one fourth of your plate at each meal with whole grains. Whole grains include whole-wheat pasta, brown rice, and whole-grain bread. Eat or drink low-fat dairy products, such as skim milk or low-fat yogurt. Fill one fourth of your plate at each meal with low-fat (lean) proteins. Low-fat proteins include fish, chicken without skin, eggs, beans, and tofu. Avoid fatty meat, cured and processed meat, or chicken with skin. Avoid pre-made or processed food. Limit the amount of salt in your diet to less than 1,500 mg each day. Do not drink alcohol if: Your doctor tells you not to drink. You are pregnant, may be pregnant, or are planning to become pregnant. If you drink alcohol: Limit how much you have to: 0-1 drink a day for women. 0-2 drinks a day for men. Know how much alcohol is in your drink. In the [place] one drink equals one 12 oz bottle of beer (355 mL), one 5 oz glass of wine (148 mL), or one 1 oz glass of hard liquor (44 mL). Lifestyle  Work with your doctor to stay at a healthy weight or to lose weight. Ask your doctor what the best weight is for you. Get at least 30 minutes of exercise that causes your heart to beat faster (aerobic exercise) most days of the week. This may include walking, swimming, or biking. Get at least 30 minutes of exercise that strengthens your muscles (resistance exercise) at least 3 days a week. This may include lifting weights or doing Pilates. Do not smoke or use any products that contain nicotine or tobacco. If you need help quitting, ask your doctor. Check your blood pressure at home as told by your doctor. Keep  all follow-up visits. Medicines Take over-the-counter and prescription medicines only as told by your doctor. Follow directions carefully. Do not skip doses of blood pressure medicine. The medicine does not work as well if you skip doses. Skipping doses also puts you at risk for problems. Ask your doctor about side effects  or reactions to medicines that you should watch for. Contact a doctor if: You think you are having a reaction to the medicine you are taking. You have headaches that keep coming back. You feel dizzy. You have swelling in your ankles. You have trouble with your vision. Get help right away if: You get a very bad headache. You start to feel mixed up (confused). You feel weak or numb. You feel faint. You have very bad pain in your: Chest. Belly (abdomen). You vomit more than once. You have trouble breathing. These symptoms may be an emergency. Get help right away. Call 911. Do not wait to see if the symptoms will go away. Do not drive yourself to the hospital. Summary Hypertension is another name for high blood pressure. High blood pressure forces your heart to work harder to pump blood. For most people, a normal blood pressure is less than 120/80. Making healthy choices can help lower blood pressure. If your blood pressure does not get lower with healthy choices, you may need to take medicine. This information is not intended to replace advice given to you by your health care provider. Make sure you discuss any questions you have with your health care provider. Document Revised: 04/27/2021 Document Reviewed: 04/27/2021 Elsevier Patient Education  2024 [employer].

## 2024-03-11 ENCOUNTER — Encounter: Payer: Self-pay | Admitting: Internal Medicine

## 2024-03-11 LAB — CMP14+EGFR
ALT: 17 IU/L (ref 0–32)
AST: 28 IU/L (ref 0–40)
Albumin: 4.1 g/dL (ref 3.9–4.9)
Alkaline Phosphatase: 110 IU/L (ref 44–121)
BUN/Creatinine Ratio: 10 — ABNORMAL LOW (ref 12–28)
BUN: 9 mg/dL (ref 8–27)
Bilirubin Total: 0.7 mg/dL (ref 0.0–1.2)
CO2: 24 mmol/L (ref 20–29)
Calcium: 9.6 mg/dL (ref 8.7–10.3)
Chloride: 103 mmol/L (ref 96–106)
Creatinine, Ser: 0.89 mg/dL (ref 0.57–1.00)
Globulin, Total: 2.7 g/dL (ref 1.5–4.5)
Glucose: 79 mg/dL (ref 70–99)
Potassium: 4.5 mmol/L (ref 3.5–5.2)
Sodium: 139 mmol/L (ref 134–144)
Total Protein: 6.8 g/dL (ref 6.0–8.5)
eGFR: 70 mL/min/1.73 (ref >59)

## 2024-03-11 LAB — CBC
Hematocrit: 43.8 % (ref 34.0–46.6)
Hemoglobin: 14.2 g/dL (ref 11.1–15.9)
MCH: 26.9 pg (ref 26.6–33.0)
MCHC: 32.4 g/dL (ref 31.5–35.7)
MCV: 83 fL (ref 79–97)
NRBC: 1 % — ABNORMAL HIGH (ref 0–0)
Platelets: 265 x10E3/uL (ref 150–450)
RBC: 5.28 x10E6/uL (ref 3.77–5.28)
RDW: 14.6 % (ref 11.7–15.4)
WBC: 4.3 x10E3/uL (ref 3.4–10.8)

## 2024-03-11 LAB — HEMOGLOBIN A1C
Est. average glucose Bld gHb Est-mCnc: 108 mg/dL
Hgb A1c MFr Bld: 5.4 % (ref 4.8–5.6)

## 2024-03-11 NOTE — Assessment & Plan Note (Signed)
 Type 2 diabetes mellitus with well-controlled blood glucose levels. Last recorded blood glucose was 96 mg/dL. She has been off Ozempic  for two weeks due to pharmacy stock issues but has refills available. Craving sweets, possibly due to insufficient fiber intake. - Resume Ozempic  1 mg weekly as it is back in stock. - Recommend berberine or Gymnema supplements to help with sweet cravings. - Increase dietary fiber intake to 5-9 servings of fruits and vegetables per day or consider fiber supplements like Benefiber or Metamucil. - Ensure adequate protein intake, aiming for at least 90 grams per day. - Download My Fitness app to track macronutrient intake. - Order blood count, liver function tests, kidney function tests, and A1c.

## 2024-03-11 NOTE — Assessment & Plan Note (Signed)
Chronic, well controlled. She will continue with lisinopril 10mg  daily. Encouraged to follow low sodium diet.

## 2024-03-11 NOTE — Assessment & Plan Note (Signed)
 She c/o ongoing sweet cravings, potentially linked to insufficient fiber and protein intake. - Increase dietary fiber and protein intake as discussed under Type 2 diabetes mellitus. - Consider using My Fitness app to monitor dietary intake. - May benefit from supplementation with gymnema which can help with sugar cravings, but first she needs to focus on her fiber intake

## 2024-03-11 NOTE — Assessment & Plan Note (Signed)
 Controlled on omeprazole  with prn use. Stop eating 3 hrs prior to lying down.

## 2024-03-12 ENCOUNTER — Ambulatory Visit: Payer: Self-pay | Admitting: Internal Medicine

## 2024-03-19 ENCOUNTER — Other Ambulatory Visit: Payer: Self-pay

## 2024-03-19 DIAGNOSIS — R7989 Other specified abnormal findings of blood chemistry: Secondary | ICD-10-CM

## 2024-03-27 ENCOUNTER — Ambulatory Visit (INDEPENDENT_AMBULATORY_CARE_PROVIDER_SITE_OTHER): Admitting: Otolaryngology

## 2024-03-27 VITALS — BP 118/79 | HR 82

## 2024-03-27 DIAGNOSIS — H9312 Tinnitus, left ear: Secondary | ICD-10-CM | POA: Diagnosis not present

## 2024-03-27 DIAGNOSIS — H9042 Sensorineural hearing loss, unilateral, left ear, with unrestricted hearing on the contralateral side: Secondary | ICD-10-CM

## 2024-03-29 NOTE — Progress Notes (Signed)
 Patient ID: Michele Pope, female   DOB: 08/10/1950, 73 y.o.   MRN: [id number]  Follow-up: Left ear tinnitus  HPI: The patient is a 73 year old female who returns today for her follow-up evaluation.  The patient was last seen in March 2025.  At that time, she was complaining of left ear tinnitus.  She was noted to have mild asymmetric left ear sensorineural hearing loss.  Her MRI scan was negative for retrocochlear lesion.  The patient returns today complaining of persistent left ear tinnitus.  She describes the tinnitus as a constant hissing noise.  She denies any otalgia, otorrhea, or vertigo.  She denies any recent change in her hearing.  Exam: General: Communicates without difficulty, well nourished, no acute distress. Head: Normocephalic, no evidence injury, no tenderness, facial buttresses intact without stepoff. Face/sinus: No tenderness to palpation and percussion. Facial movement is normal and symmetric. Eyes: PERRL, EOMI. No scleral icterus, conjunctivae clear. Neuro: CN II exam reveals vision grossly intact.  No nystagmus at any point of gaze. Ears: Auricles well formed without lesions.  Ear canals are intact without mass or lesion.  No erythema or edema is appreciated.  The TMs are intact without fluid. Nose: External evaluation reveals normal support and skin without lesions.  Dorsum is intact.  Anterior rhinoscopy reveals normal mucosa over anterior aspect of inferior turbinates and intact septum.  No purulence noted. Oral:  Oral cavity and oropharynx are intact, symmetric, without erythema or edema.  Mucosa is moist without lesions. Neck: Full range of motion without pain.  There is no significant lymphadenopathy.  No masses palpable.  Thyroid bed within normal limits to palpation.  Parotid glands and submandibular glands equal bilaterally without mass.  Trachea is midline. Neuro:  CN 2-12 grossly intact.   Assessment: 1.  Left ear subjective tinnitus. 2.  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  Her MRI scan was negative for retrocochlear lesion. 3.  Her ear canals, tympanic membranes, and middle ear spaces are normal.  Plan: 1.  The physical exam findings and the MRI results are reviewed with the patient. 2.  The strategies to cope with tinnitus, including the use of masker, hearing aids, tinnitus retraining therapy, and avoidance of caffeine and alcohol are discussed. 3.  The patient will return for reevaluation in 1 year, sooner if needed.

## 2024-04-24 ENCOUNTER — Other Ambulatory Visit: Payer: Self-pay | Admitting: Internal Medicine

## 2024-04-24 DIAGNOSIS — E1169 Type 2 diabetes mellitus with other specified complication: Secondary | ICD-10-CM

## 2024-04-25 ENCOUNTER — Other Ambulatory Visit: Payer: Self-pay | Admitting: Physician Assistant

## 2024-04-25 DIAGNOSIS — H6992 Unspecified Eustachian tube disorder, left ear: Secondary | ICD-10-CM

## 2024-04-26 ENCOUNTER — Telehealth: Admitting: Family

## 2024-04-26 DIAGNOSIS — B3731 Acute candidiasis of vulva and vagina: Secondary | ICD-10-CM

## 2024-04-26 MED ORDER — FLUCONAZOLE 150 MG PO TABS: 150.0000 mg | ORAL_TABLET | ORAL | 0 refills | Status: DC | PRN

## 2024-04-26 NOTE — Progress Notes (Signed)

## 2024-04-29 ENCOUNTER — Ambulatory Visit: Payer: Medicare Other

## 2024-04-29 DIAGNOSIS — Z0000 Encounter for general adult medical examination without abnormal findings: Secondary | ICD-10-CM

## 2024-04-29 NOTE — Progress Notes (Signed)
 Subjective:   Michele Pope is a 73 y.o. who presents for a Medicare Wellness preventive visit.  As a reminder, Annual Wellness Visits don't include a physical exam, and some assessments may be limited, especially if this visit is performed virtually. We may recommend an in-person follow-up visit with your provider if needed.  Visit Complete: Virtual I connected with  Michele Pope on 04/29/24 by a audio enabled telemedicine application and verified that I am speaking with the correct person using two identifiers.  Patient Location: Home  Provider Location: Office/Clinic  I discussed the limitations of evaluation and management by telemedicine. The patient expressed understanding and agreed to proceed.  Vital Signs: Because this visit was a virtual/telehealth visit, some criteria may be missing or patient reported. Any vitals not documented were not able to be obtained and vitals that have been documented are patient reported.  VideoError- Librarian, academic were attempted between this provider and patient, however failed, due to patient having technical difficulties OR patient did not have access to video capability.  We continued and completed visit with audio only.   Persons Participating in Visit: Patient.  AWV Questionnaire: Yes: Patient Medicare AWV questionnaire was completed by the patient on 04/28/2024; I have confirmed that all information answered by patient is correct and no changes since this date.  Cardiac Risk Factors include: advanced age (>23men, >23 women);diabetes mellitus;dyslipidemia;hypertension     Objective:    Today's Vitals   There is no height or weight on file to calculate BMI.     04/29/2024    9:01 AM 04/24/2023   12:22 PM 04/21/2022    9:17 AM 04/13/2021   12:01 PM 02/24/2020   10:39 AM 02/17/2019    4:08 PM  Advanced Directives  Does Patient Have a Medical Advance Directive? Yes No No No No No  Type of Special educational needs teacher of [place];Living will       Copy of Healthcare Power of Attorney in Chart? No - copy requested       Would patient like information on creating a medical advance directive?   Yes (ED - Information included in AVS)  Yes (MAU/Ambulatory/Procedural Areas - Information given) No - Patient declined      Data saved with a previous flowsheet row definition    Current Medications (verified) Outpatient Encounter Medications as of 04/29/2024  Medication Sig   Blood Glucose Monitoring Suppl (ONE TOUCH ULTRA 2) w/Device KIT USE TO TEST BLOOD SUGAR ONCE DAILY.   Cholecalciferol (VITAMIN D3) 125 MCG (5000 UT) CAPS Take by mouth.   Cyanocobalamin  ([employer] VITAMIN B12 PO) Take 1 tablet by mouth daily at 2 am.   Dapagliflozin  Pro-metFORMIN  ER 04-999 MG TB24 Take 1 tablet by mouth daily.   diclofenac sodium (VOLTAREN) 1 % GEL Apply topically 4 (four) times daily.   ezetimibe  (ZETIA ) 10 MG tablet Take 1 tablet (10 mg total) by mouth daily.   fluconazole  (DIFLUCAN ) 150 MG tablet Take 1 tablet (150 mg total) by mouth every three (3) days as needed.   fluticasone  (FLONASE ) 50 MCG/ACT nasal spray Place 2 sprays into both nostrils daily.   icosapent  Ethyl (VASCEPA ) 1 g capsule TAKE 1 CAPSULE BY MOUTH DAILY.   lisinopril  (ZESTRIL ) 10 MG tablet Take 1 tablet (10 mg total) by mouth daily.   loratadine  (CLARITIN ) 10 MG tablet Take 1 tablet (10 mg total) by mouth daily.   Magnesium  250 MG TABS Take 1 tablet (250 mg total) by mouth daily.  Misc Natural Products (NEURIVA PO) Take by mouth. 1 per day   mometasone  (NASONEX ) 50 MCG/ACT nasal spray SPRAY 2 SPRAYS INTO EACH NOSTRIL EVERY DAY   omeprazole  (PRILOSEC) 40 MG capsule TAKE 1 CAPSULE (40 MG TOTAL) BY MOUTH DAILY.   ONETOUCH ULTRA test strip USE TO TEST BLOOD SUGAR ONCE DAILY   Semaglutide , 1 MG/DOSE, (OZEMPIC , 1 MG/DOSE,) 4 MG/3ML SOPN Inject 1 mg into the skin once a week. INJECT 1MG  SUBCUTANEOUSLY ONCE WEEKLY   triamcinolone  ointment (KENALOG ) 0.5  % Apply 1 Application topically 2 (two) times daily. (Patient not taking: Reported on 04/29/2024)   No facility-administered encounter medications on file as of 04/29/2024.    Allergies (verified) Meloxicam and Pitavastatin   History: Past Medical History:  Diagnosis Date   Anxiety 06/25/2023   Diabetes mellitus without complication (HCC)    Gastroesophageal reflux disease without esophagitis 11/04/2023   High cholesterol    HTN (hypertension)    Past Surgical History:  Procedure Laterality Date   BREAST EXCISIONAL BIOPSY Right 2000   CESAREAN SECTION     SHOULDER ARTHROSCOPY W/ ROTATOR CUFF REPAIR Right 08/22/2018   TOTAL ABDOMINAL HYSTERECTOMY  1995   Family History  Problem Relation Age of Onset   Heart attack Mother    Blindness Father    Social History   Socioeconomic History   Marital status: Married    Spouse name: Not on file   Number of children: Not on file   Years of education: Not on file   Highest education level: Some college, no degree  Occupational History   Not on file  Tobacco Use   Smoking status: Never   Smokeless tobacco: Never  Vaping Use   Vaping status: Never Used  Substance and Sexual Activity   Alcohol use: Never   Drug use: Never   Sexual activity: Not Currently    Birth control/protection: None  Other Topics Concern   Not on file  Social History Narrative   Not on file   Social Drivers of Health   Financial Resource Strain: Low Risk  (04/28/2024)   Overall Financial Resource Strain (CARDIA)    Difficulty of Paying Living Expenses: Not hard at all  Food Insecurity: No Food Insecurity (04/28/2024)   Hunger Vital Sign    Worried About Running Out of Food in the Last Year: Never true    Ran Out of Food in the Last Year: Never true  Transportation Needs: No Transportation Needs (04/28/2024)   PRAPARE - Administrator, Civil Service (Medical): No    Lack of Transportation (Non-Medical): No  Physical Activity: Sufficiently  Active (04/28/2024)   Exercise Vital Sign    Days of Exercise per Week: 4 days    Minutes of Exercise per Session: 90 min  Stress: No Stress Concern Present (04/28/2024)   [employer] of Occupational Health - Occupational Stress Questionnaire    Feeling of Stress: Not at all  Social Connections: Socially Integrated (04/28/2024)   Social Connection and Isolation Panel    Frequency of Communication with Friends and Family: More than three times a week    Frequency of Social Gatherings with Friends and Family: Twice a week    Attends Religious Services: More than 4 times per year    Active Member of [employer] or Organizations: Yes    Attends Engineer, structural: More than 4 times per year    Marital Status: Married    Tobacco Counseling Counseling given: Not Answered  Clinical Intake:  Pre-visit preparation completed: Yes  Pain : No/denies pain     Nutritional Risks: None Diabetes: Yes CBG done?: No Did pt. bring in CBG monitor from home?: No  Lab Results  Component Value Date   HGBA1C 5.4 03/10/2024   HGBA1C 5.3 11/04/2023   HGBA1C 5.4 06/25/2023     How often do you need to have someone help you when you read instructions, pamphlets, or other written materials from your doctor or pharmacy?: 1 - Never  Interpreter Needed?: No  Information entered by :: Michele Pope   Activities of Daily Living     04/28/2024    9:05 AM  In your present state of health, do you have any difficulty performing the following activities:  Hearing? 0  Vision? 0  Difficulty concentrating or making decisions? 0  Walking or climbing stairs? 0  Dressing or bathing? 0  Doing errands, shopping? 0  Preparing Food and eating ? N  Using the Toilet? N  In the past six months, have you accidently leaked urine? N  Do you have problems with loss of bowel control? N  Managing your Medications? N  Managing your Finances? N  Housekeeping or managing your Housekeeping? N    Patient  Care Team: Jarold Medici, MD as PCP - General (Internal Medicine) Michele Richardson, DO as PCP - Cardiology (Cardiology) [facility]  I have updated your Care Teams any recent Medical Services you may have received from other providers in the past year.     Assessment:   This is a routine wellness examination for Michele Pope.  Hearing/Vision screen Hearing Screening - Comments:: Denies hearing issues Vision Screening - Comments:: Regular eye exams, [facility]   Goals Addressed             This Visit's Progress    Patient Stated       04/29/2024, wants to get down to 150 pounds       Depression Screen     04/29/2024    9:02 AM 03/10/2024   10:17 AM 11/04/2023    9:26 AM 10/28/2023    2:56 PM 10/18/2023   11:23 AM 06/25/2023    9:07 AM 04/24/2023   12:25 PM  PHQ 2/9 Scores  PHQ - 2 Score 0 0 0 0 0 0 0  PHQ- 9 Score 0 0 0 0 1 0 0    Fall Risk     04/28/2024    9:05 AM 03/10/2024   10:17 AM 11/04/2023    9:26 AM 10/28/2023    2:56 PM 10/18/2023   11:23 AM  Fall Risk   Falls in the past year? 0 0 0 0 0  Number falls in past yr: 0 0 0    Injury with Fall? 0 0 0    Risk for fall due to : Medication side effect No Fall Risks No Fall Risks    Follow up Falls prevention discussed;Falls evaluation completed Falls evaluation completed Falls evaluation completed      MEDICARE RISK AT HOME:  Medicare Risk at Home Any stairs in or around the home?: (Patient-Rptd) Yes If so, are there any without handrails?: (Patient-Rptd) No Home free of loose throw rugs in walkways, pet beds, electrical cords, etc?: (Patient-Rptd) Yes Adequate lighting in your home to reduce risk of falls?: (Patient-Rptd) Yes Life alert?: (Patient-Rptd) No Use of a cane, walker or w/c?: (Patient-Rptd) No Grab bars in the bathroom?: (Patient-Rptd) Yes Shower chair or bench in shower?: (Patient-Rptd)  No Elevated toilet seat or a handicapped toilet?: (Patient-Rptd) Yes  TIMED UP AND GO:  Was the test  performed?  No  Cognitive Function: 6CIT completed        04/29/2024    9:03 AM 04/24/2023   12:25 PM 04/21/2022    9:18 AM 04/13/2021   12:03 PM 02/24/2020   10:42 AM  6CIT Screen  What Year? 0 points 0 points 0 points 0 points 0 points  What month? 0 points 0 points 0 points 0 points 0 points  What time? 0 points 0 points 0 points 0 points 0 points  Count back from 20 0 points 0 points 0 points 0 points 2 points  Months in reverse 0 points 0 points 0 points 0 points 0 points  Repeat phrase 0 points 0 points 0 points 2 points 4 points  Total Score 0 points 0 points 0 points 2 points 6 points    Immunizations Immunization History  Administered Date(s) Administered   Fluad Quad(high Dose 65+) 04/13/2021, 04/19/2022   Fluad Trivalent(High Dose 65+) 03/26/2023   INFLUENZA, HIGH DOSE SEASONAL PF 05/14/2018   Influenza,inj,Quad PF,6+ Mos 04/16/2019   Influenza,inj,quad, With Preservative 04/22/2018   Influenza-Unspecified 04/20/2019, 04/25/2020, 04/13/2021   Moderna Sars-Covid-2 Vaccination 08/24/2019, 09/18/2019, 05/25/2020, 09/21/2020   PNEUMOCOCCAL CONJUGATE-20 09/12/2021   Pfizer(Comirnaty)Fall Seasonal Vaccine 12 years and older 05/03/2023, 03/10/2024   Pneumococcal Polysaccharide-23 05/03/2009, 02/17/2019   Tdap 09/15/2018   Zoster Recombinant(Shingrix) 02/26/2020, 08/05/2020    Screening Tests Health Maintenance  Topic Date Due   Influenza Vaccine  02/21/2024   OPHTHALMOLOGY EXAM  03/03/2024   COVID-19 Vaccine (7 - 2025-26 season) 05/05/2024   Diabetic kidney evaluation - Urine ACR  06/24/2024   FOOT EXAM  06/24/2024   HEMOGLOBIN A1C  09/10/2024   Mammogram  11/24/2024   Diabetic kidney evaluation - eGFR measurement  03/10/2025   Medicare Annual Wellness (AWV)  04/29/2025   DTaP/Tdap/Td (2 - Td or Tdap) 09/15/2028   Colonoscopy  08/10/2032   Pneumococcal Vaccine: 50+ Years  Completed   DEXA SCAN  Completed   Hepatitis C Screening  Completed   Zoster Vaccines-  Shingrix  Completed   Meningococcal B Vaccine  Aged Out    Health Maintenance Items Addressed: Vaccines Due: flu, appointment for eye doctor in November  Additional Screening:  Vision Screening: Recommended annual ophthalmology exams for early detection of glaucoma and other disorders of the eye. Is the patient up to date with their annual eye exam?  Yes  Who is the provider or what is the name of the office in which the patient attends annual eye exams? [facility]  Dental Screening: Recommended annual dental exams for proper oral hygiene  Community Resource Referral / Chronic Care Management: CRR required this visit?  No   CCM required this visit?  No   Plan:    I have personally reviewed and noted the following in the patient's chart:   Medical and social history Use of alcohol, tobacco or illicit drugs  Current medications and supplements including opioid prescriptions. Patient is not currently taking opioid prescriptions. Functional ability and status Nutritional status Physical activity Advanced directives List of other physicians Hospitalizations, surgeries, and ER visits in previous 12 months Vitals Screenings to include cognitive, depression, and falls Referrals and appointments  In addition, I have reviewed and discussed with patient certain preventive protocols, quality metrics, and best practice recommendations. A written personalized care plan for preventive services as well as general preventive health recommendations were  provided to patient.   Michele FORBES Dawn, Pope   [id number]   After Visit Summary: (MyChart) Due to this being a telephonic visit, the after visit summary with patients personalized plan was offered to patient via MyChart   Notes: Nothing significant to report at this time.

## 2024-04-29 NOTE — Patient Instructions (Signed)
 Michele Pope,  Thank you for taking the time for your Medicare Wellness Visit. I appreciate your continued commitment to your health goals. Please review the care plan we discussed, and feel free to reach out if I can assist you further.  Medicare recommends these wellness visits once per year to help you and your care team stay ahead of potential health issues. These visits are designed to focus on prevention, allowing your provider to concentrate on managing your acute and chronic conditions during your regular appointments.  Please note that Annual Wellness Visits do not include a physical exam. Some assessments may be limited, especially if the visit was conducted virtually. If needed, we may recommend a separate in-person follow-up with your provider.  Ongoing Care Seeing your primary care provider every 3 to 6 months helps us  monitor your health and provide consistent, personalized care.   Referrals If a referral was made during today's visit and you haven't received any updates within two weeks, please contact the referred provider directly to check on the status.  Recommended Screenings:  Health Maintenance  Topic Date Due   Flu Shot  02/21/2024   Eye exam for diabetics  03/03/2024   COVID-19 Vaccine (7 - 2025-26 season) 05/05/2024   Yearly kidney health urinalysis for diabetes  06/24/2024   Complete foot exam   06/24/2024   Hemoglobin A1C  09/10/2024   Breast Cancer Screening  11/24/2024   Yearly kidney function blood test for diabetes  03/10/2025   Medicare Annual Wellness Visit  04/29/2025   DTaP/Tdap/Td vaccine (2 - Td or Tdap) 09/15/2028   Colon Cancer Screening  08/10/2032   Pneumococcal Vaccine for age over 34  Completed   DEXA scan (bone density measurement)  Completed   Hepatitis C Screening  Completed   Zoster (Shingles) Vaccine  Completed   Meningitis B Vaccine  Aged Out       04/29/2024    9:01 AM  Advanced Directives  Does Patient Have a Medical Advance  Directive? Yes  Type of Estate agent of [place];Living will  Copy of Healthcare Power of Attorney in Chart? No - copy requested   Advance Care Planning is important because it: Ensures you receive medical care that aligns with your values, goals, and preferences. Provides guidance to your family and loved ones, reducing the emotional burden of decision-making during critical moments.  Vision: Annual vision screenings are recommended for early detection of glaucoma, cataracts, and diabetic retinopathy. These exams can also reveal signs of chronic conditions such as diabetes and high blood pressure.  Dental: Annual dental screenings help detect early signs of oral cancer, gum disease, and other conditions linked to overall health, including heart disease and diabetes.  Please see the attached documents for additional preventive care recommendations.

## 2024-05-08 ENCOUNTER — Ambulatory Visit (INDEPENDENT_AMBULATORY_CARE_PROVIDER_SITE_OTHER): Payer: Self-pay

## 2024-05-08 VITALS — BP 120/74 | HR 98 | Temp 97.7°F | Ht 66.0 in | Wt 166.0 lb

## 2024-05-08 DIAGNOSIS — Z23 Encounter for immunization: Secondary | ICD-10-CM

## 2024-05-08 DIAGNOSIS — R7989 Other specified abnormal findings of blood chemistry: Secondary | ICD-10-CM

## 2024-05-08 NOTE — Progress Notes (Signed)
 Patient is in office today for a nurse visit for flu Immunization and labs. Patient Injection was given in the  Right deltoid. Patient tolerated injection well.

## 2024-05-09 ENCOUNTER — Ambulatory Visit: Payer: Self-pay | Admitting: Internal Medicine

## 2024-05-09 LAB — CBC WITH DIFFERENTIAL/PLATELET
Basophils Absolute: 0 x10E3/uL (ref 0.0–0.2)
Basos: 0 %
EOS (ABSOLUTE): 0.1 x10E3/uL (ref 0.0–0.4)
Eos: 1 %
Hematocrit: 43.2 % (ref 34.0–46.6)
Hemoglobin: 14.2 g/dL (ref 11.1–15.9)
Immature Grans (Abs): 0 x10E3/uL (ref 0.0–0.1)
Immature Granulocytes: 0 %
Lymphocytes Absolute: 2.4 x10E3/uL (ref 0.7–3.1)
Lymphs: 53 %
MCH: 27.2 pg (ref 26.6–33.0)
MCHC: 32.9 g/dL (ref 31.5–35.7)
MCV: 83 fL (ref 79–97)
Monocytes Absolute: 0.2 x10E3/uL (ref 0.1–0.9)
Monocytes: 5 %
Neutrophils Absolute: 1.9 x10E3/uL (ref 1.4–7.0)
Neutrophils: 41 %
Platelets: 277 x10E3/uL (ref 150–450)
RBC: 5.22 x10E6/uL (ref 3.77–5.28)
RDW: 14.6 % (ref 11.7–15.4)
WBC: 4.6 x10E3/uL (ref 3.4–10.8)

## 2024-05-09 LAB — IRON,TIBC AND FERRITIN PANEL
Ferritin: 58 ng/mL (ref 15–150)
Iron Saturation: 24 % (ref 15–55)
Iron: 88 ug/dL (ref 27–139)
Total Iron Binding Capacity: 363 ug/dL (ref 250–450)
UIBC: 275 ug/dL (ref 118–369)

## 2024-05-14 ENCOUNTER — Other Ambulatory Visit: Payer: Self-pay | Admitting: Internal Medicine

## 2024-06-12 LAB — OPHTHALMOLOGY REPORT-SCANNED

## 2024-06-25 ENCOUNTER — Encounter: Payer: Medicare Other | Admitting: Internal Medicine

## 2024-07-09 ENCOUNTER — Other Ambulatory Visit: Payer: Self-pay

## 2024-07-09 ENCOUNTER — Other Ambulatory Visit: Payer: Self-pay | Admitting: Internal Medicine

## 2024-07-09 MED ORDER — DAPAGLIFLOZIN PRO-METFORMIN ER 10-1000 MG PO TB24: 1.0000 | ORAL_TABLET | Freq: Every day | ORAL | 1 refills | Status: AC

## 2024-08-17 ENCOUNTER — Telehealth: Admitting: Physician Assistant

## 2024-08-17 DIAGNOSIS — B3731 Acute candidiasis of vulva and vagina: Secondary | ICD-10-CM

## 2024-08-17 MED ORDER — FLUCONAZOLE 150 MG PO TABS: 150.0000 mg | ORAL_TABLET | ORAL | 0 refills | Status: AC | PRN

## 2024-08-17 NOTE — Progress Notes (Signed)

## 2024-09-09 ENCOUNTER — Encounter: Payer: Self-pay | Admitting: Internal Medicine

## 2025-06-02 ENCOUNTER — Ambulatory Visit: Payer: Self-pay
